# Patient Record
Sex: Female | Born: 1965
Health system: Southern US, Community
[De-identification: ages and names within clinical notes are randomized; demographics above are authoritative.]

## PROBLEM LIST (undated history)

## (undated) DIAGNOSIS — R1013 Epigastric pain: Secondary | ICD-10-CM

## (undated) DIAGNOSIS — E876 Hypokalemia: Secondary | ICD-10-CM

## (undated) DIAGNOSIS — Z803 Family history of malignant neoplasm of breast: Secondary | ICD-10-CM

## (undated) DIAGNOSIS — IMO0002 Reserved for concepts with insufficient information to code with codable children: Secondary | ICD-10-CM

## (undated) DIAGNOSIS — G8929 Other chronic pain: Secondary | ICD-10-CM

## (undated) DIAGNOSIS — G43909 Migraine, unspecified, not intractable, without status migrainosus: Secondary | ICD-10-CM

## (undated) DIAGNOSIS — R7989 Other specified abnormal findings of blood chemistry: Secondary | ICD-10-CM

## (undated) DIAGNOSIS — R1031 Right lower quadrant pain: Secondary | ICD-10-CM

## (undated) DIAGNOSIS — R1011 Right upper quadrant pain: Secondary | ICD-10-CM

## (undated) DIAGNOSIS — K5900 Constipation, unspecified: Secondary | ICD-10-CM

## (undated) DIAGNOSIS — R079 Chest pain, unspecified: Secondary | ICD-10-CM

## (undated) DIAGNOSIS — D649 Anemia, unspecified: Secondary | ICD-10-CM

## (undated) DIAGNOSIS — K3189 Other diseases of stomach and duodenum: Secondary | ICD-10-CM

## (undated) DIAGNOSIS — I10 Essential (primary) hypertension: Secondary | ICD-10-CM

## (undated) DIAGNOSIS — G459 Transient cerebral ischemic attack, unspecified: Secondary | ICD-10-CM

## (undated) DIAGNOSIS — R945 Abnormal results of liver function studies: Secondary | ICD-10-CM

## (undated) HISTORY — DX: Family history of malignant neoplasm of breast: Z80.3

## (undated) HISTORY — DX: Other diseases of stomach and duodenum: K31.89

## (undated) HISTORY — DX: Transient cerebral ischemic attack, unspecified: G45.9

## (undated) HISTORY — DX: Constipation, unspecified: K59.00

## (undated) HISTORY — DX: Epigastric pain: R10.13

## (undated) HISTORY — DX: Hypokalemia: E87.6

## (undated) HISTORY — DX: Right upper quadrant pain: R10.11

## (undated) HISTORY — DX: Anemia, unspecified: D64.9

## (undated) HISTORY — DX: Right lower quadrant pain: R10.31

## (undated) HISTORY — DX: Migraine, unspecified, not intractable, without status migrainosus: G43.909

## (undated) HISTORY — DX: Other chronic pain: G89.29

## (undated) HISTORY — DX: Chest pain, unspecified: R07.9

## (undated) HISTORY — DX: Abnormal results of liver function studies: R94.5

## (undated) HISTORY — DX: Other specified abnormal findings of blood chemistry: R79.89

## (undated) HISTORY — PX: TUBAL LIGATION: SHX77

---

## 1998-08-29 ENCOUNTER — Ambulatory Visit (HOSPITAL_COMMUNITY): Admission: RE | Admit: 1998-08-29 | Discharge: 1998-08-29 | Payer: Self-pay | Admitting: Family Medicine

## 1998-08-29 ENCOUNTER — Encounter: Payer: Self-pay | Admitting: Family Medicine

## 1998-10-15 ENCOUNTER — Encounter: Payer: Self-pay | Admitting: Family Medicine

## 1998-10-15 ENCOUNTER — Ambulatory Visit (HOSPITAL_COMMUNITY): Admission: RE | Admit: 1998-10-15 | Discharge: 1998-10-15 | Payer: Self-pay | Admitting: Family Medicine

## 1999-04-17 ENCOUNTER — Encounter: Admission: RE | Admit: 1999-04-17 | Discharge: 1999-04-30 | Payer: Self-pay | Admitting: *Deleted

## 1999-04-24 ENCOUNTER — Encounter: Payer: Self-pay | Admitting: Gastroenterology

## 1999-04-24 ENCOUNTER — Ambulatory Visit (HOSPITAL_COMMUNITY): Admission: RE | Admit: 1999-04-24 | Discharge: 1999-04-24 | Payer: Self-pay | Admitting: Gastroenterology

## 1999-05-25 ENCOUNTER — Emergency Department (HOSPITAL_COMMUNITY): Admission: EM | Admit: 1999-05-25 | Discharge: 1999-05-26 | Payer: Self-pay | Admitting: Emergency Medicine

## 1999-05-27 ENCOUNTER — Encounter: Payer: Self-pay | Admitting: Gastroenterology

## 1999-05-27 ENCOUNTER — Ambulatory Visit (HOSPITAL_COMMUNITY): Admission: RE | Admit: 1999-05-27 | Discharge: 1999-05-27 | Payer: Self-pay | Admitting: Gastroenterology

## 1999-05-31 ENCOUNTER — Emergency Department (HOSPITAL_COMMUNITY): Admission: EM | Admit: 1999-05-31 | Discharge: 1999-05-31 | Payer: Self-pay | Admitting: Emergency Medicine

## 2000-06-10 ENCOUNTER — Encounter: Payer: Self-pay | Admitting: Family Medicine

## 2000-06-10 ENCOUNTER — Ambulatory Visit (HOSPITAL_COMMUNITY): Admission: RE | Admit: 2000-06-10 | Discharge: 2000-06-10 | Payer: Self-pay | Admitting: Family Medicine

## 2001-07-05 ENCOUNTER — Encounter: Payer: Self-pay | Admitting: Family Medicine

## 2001-07-05 ENCOUNTER — Encounter: Admission: RE | Admit: 2001-07-05 | Discharge: 2001-07-05 | Payer: Self-pay | Admitting: Family Medicine

## 2002-06-29 ENCOUNTER — Other Ambulatory Visit: Admission: RE | Admit: 2002-06-29 | Discharge: 2002-06-29 | Payer: Self-pay | Admitting: Gynecology

## 2002-10-30 ENCOUNTER — Encounter: Payer: Self-pay | Admitting: *Deleted

## 2002-10-30 ENCOUNTER — Encounter: Admission: RE | Admit: 2002-10-30 | Discharge: 2002-10-30 | Payer: Self-pay | Admitting: *Deleted

## 2002-11-28 ENCOUNTER — Encounter: Admission: RE | Admit: 2002-11-28 | Discharge: 2002-11-28 | Payer: Self-pay | Admitting: Gastroenterology

## 2002-11-28 ENCOUNTER — Encounter: Payer: Self-pay | Admitting: Gastroenterology

## 2002-12-24 ENCOUNTER — Ambulatory Visit (HOSPITAL_COMMUNITY): Admission: RE | Admit: 2002-12-24 | Discharge: 2002-12-24 | Payer: Self-pay | Admitting: Gastroenterology

## 2003-01-16 ENCOUNTER — Ambulatory Visit (HOSPITAL_COMMUNITY): Admission: RE | Admit: 2003-01-16 | Discharge: 2003-01-16 | Payer: Self-pay | Admitting: Gastroenterology

## 2003-01-16 ENCOUNTER — Encounter: Payer: Self-pay | Admitting: Gastroenterology

## 2003-08-21 ENCOUNTER — Emergency Department (HOSPITAL_COMMUNITY): Admission: AD | Admit: 2003-08-21 | Discharge: 2003-08-21 | Payer: Self-pay | Admitting: Family Medicine

## 2007-08-08 ENCOUNTER — Emergency Department (HOSPITAL_COMMUNITY): Admission: EM | Admit: 2007-08-08 | Discharge: 2007-08-09 | Payer: Self-pay | Admitting: Emergency Medicine

## 2007-08-30 ENCOUNTER — Ambulatory Visit: Payer: Self-pay | Admitting: Gastroenterology

## 2008-10-09 ENCOUNTER — Ambulatory Visit (HOSPITAL_COMMUNITY): Admission: RE | Admit: 2008-10-09 | Discharge: 2008-10-09 | Payer: Self-pay | Admitting: Family Medicine

## 2008-10-30 ENCOUNTER — Ambulatory Visit (HOSPITAL_COMMUNITY): Admission: RE | Admit: 2008-10-30 | Discharge: 2008-10-30 | Payer: Self-pay | Admitting: Family Medicine

## 2009-03-17 ENCOUNTER — Encounter: Admission: RE | Admit: 2009-03-17 | Discharge: 2009-03-17 | Payer: Self-pay | Admitting: Family Medicine

## 2009-12-12 ENCOUNTER — Emergency Department (HOSPITAL_BASED_OUTPATIENT_CLINIC_OR_DEPARTMENT_OTHER): Admission: EM | Admit: 2009-12-12 | Discharge: 2009-12-12 | Payer: Self-pay | Admitting: Emergency Medicine

## 2010-03-23 ENCOUNTER — Encounter: Admission: RE | Admit: 2010-03-23 | Discharge: 2010-03-23 | Payer: Self-pay | Admitting: Family Medicine

## 2010-09-29 NOTE — Assessment & Plan Note (Signed)
[facility]                         GASTROENTEROLOGY OFFICE NOTE   Stacey Mendoza, Stacey Mendoza                     MRN:          [id number]  DATE:08/30/2007                            DOB:          02-Dec-1965    CHIEF COMPLAINT:  Abdominal pain.   HISTORY OF PRESENT ILLNESS:  Stacey Mendoza is a 45 year old African  American female referred through the courtesy of Dr. Cliffton Asters for  evaluation.  For years, she has been complaining of right flank pain  consisting of aching, mild pain over her right ribs. It is not related  to eating or position.  She occasionally has mild postprandial  midepigastric pain which she attributes to certain foods including  greasy foods and spicy foods.  On March 25, she was seen in the ER  because of severe upper abdominal pain associated with nausea and  vomiting.  She claims the pain was much worse than pain she has had in  the past.  She underwent workup including ultrasound, LFTs and lipase,  which were all normal.  CBC was pertinent for a normal white count, but  a microcytic anemia.  She does have heavy menstrual periods.  She is on  no gastric irritants including nonsteroidals.  She has had no  recurrences of pain.   She underwent upper endoscopy several years ago for complaints of upper  abdominal pain and was told this exam was negative.   PAST MEDICAL HISTORY:  Unremarkable.   FAMILY HISTORY:  Noncontributory.   MEDICATIONS:  She is on no regular medicine.   ALLERGIES:  She has no allergies.   She neither smokes nor drinks.  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   REVIEW OF SYSTEMS:  Positive for headaches, otherwise is negative.   PHYSICAL EXAMINATION:  She is a healthy-appearing female, pulse 80,  blood pressure 122/80, weight 163.  HEENT:  EOMI.  PERRLA.  Sclerae are anicteric.  Conjunctivae are pink.  NECK:  Supple without thyromegaly, adenopathy or carotid bruits.  CHEST:  Clear to auscultation and  percussion without adventitious  sounds.  She has some minimal tenderness over the right lateral ribs.  There are no masses.  CARDIAC:  Regular rhythm; normal S1, S2.  There are no murmurs, gallops  or rubs.  ABDOMEN:  Bowel sounds are normoactive.  Abdomen is soft, nontender and  nondistended.  There are no abdominal masses, tenderness, splenic  enlargement or hepatomegaly.  EXTREMITIES:  Full range of motion.  No cyanosis, clubbing or edema.  RECTAL:  Deferred.  MUSCULOSKELETAL:  Pertinent for no deformities.  NEUROLOGIC:  She is nonfocal and oriented x3.   LABORATORY AND X-RAY REPORTS:  Reviewed as started in the HPI.   IMPRESSION:  1. Upper abdominal pain.  The etiology for this is not apparent by her      previous workup.  There is no evidence for gallbladder disease or      pancreatitis.  Pain would be very atypical for active peptic ulcer      disease or gastroesophageal reflux disease.  At this point, I      suspect  that she had an episode of severe spasm.  2. Microcytic anemia.  This is probably due to menorrhagia.  3. Musculoskeletal pain over the right ribs.   RECOMMENDATION:  NuLev 0.25 mg sublingual p.r.n.  I carefully instructed  Stacey Mendoza to return if she is having recurrent abdominal pain.     Barbette Hair. Arlyce Dice, MD,FACG  Electronically Signed    RDK/MedQ  DD: 08/30/2007  DT: 08/30/2007  Job #: [id number]   cc:   Stacey Mendoza. Cliffton Asters, M.D.

## 2010-09-29 NOTE — Letter (Signed)
August 30, 2007    Stacie Acres. Cliffton Asters, M.D.  [address]  [place], [place] [address]   RE:  Stacey, Mendoza  MRN:  [id number]  /  DOB:  06/15/1965   Dear Dr. Cliffton Asters:   Upon your kind referral, I had the pleasure of evaluating your patient  and I am pleased to offer my findings.  I saw Stacey Mendoza in the  office today.  Enclosed is a copy of my progress note that details my  findings and recommendations.   Thank you for the opportunity to participate in your patient's care.    Sincerely,      Barbette Hair. Arlyce Dice, MD,FACG  Electronically Signed    RDK/MedQ  DD: 08/30/2007  DT: 08/30/2007  Job #: [id number]

## 2010-09-29 NOTE — Letter (Signed)
August 30, 2007    [name]   RE:  SAE, HANDRICH  MRN:  [id number]  /  DOB:  03-26-1966   Dear [name],   It is my pleasure to have treated you recently as a new patient in my  office.  I appreciate your confidence and the opportunity to participate  in your care.   Since I do have a busy inpatient endoscopy schedule and office schedule,  my office hours vary weekly.  I am, however, available for emergency  calls every day through my office.  If I cannot promptly meet an urgent  office appointment, another one of our gastroenterologists will be able  to assist you.   My well-trained staff are prepared to help you at all times.  For  emergencies after office hours, a physician from our gastroenterology  section is always available through my 24-hour answering service.   While you are under my care, I encourage discussion of your questions  and concerns, and I will be happy to return your calls as soon as I am  available.   Once again, I welcome you as a new patient and I look forward to a happy  and healthy relationship.    Sincerely,      Barbette Hair. Arlyce Dice, MD,FACG  Electronically Signed   RDK/MedQ  DD: 08/30/2007  DT: 08/30/2007  Job #: [id number]

## 2010-10-02 NOTE — Op Note (Signed)
   Stacey Mendoza, Stacey Mendoza                        ACCOUNT NO.:  [id number]   MEDICAL RECORD NO.:  [id number]                   PATIENT TYPE:  AMB   LOCATION:  ENDO                                 FACILITY:  [facility]   PHYSICIAN:  Anselmo Rod, M.D.               DATE OF BIRTH:  19-Feb-1966   DATE OF PROCEDURE:  12/24/2002  DATE OF DISCHARGE:                                 OPERATIVE REPORT   PROCEDURE PERFORMED:  Esophagogastroduodenoscopy.   ENDOSCOPIST:  Charna Elizabeth, M.D.   INSTRUMENT USED:  Olympus video panendoscope.   INDICATIONS FOR PROCEDURE:  The patient is a 45 year old African-American  female with a history of epigastric pain and nausea.  Rule out peptic ulcer  disease, esophagitis, gastritis, etc.   PREPROCEDURE PREPARATION:  Informed consent was procured from the patient.  The patient was fasted for eight hours prior to the procedure.   PREPROCEDURE PHYSICAL:  The patient had stable vital signs.  Neck supple,  chest clear to auscultation.  S1, S2 regular.  Abdomen soft with normal  bowel sounds.   DESCRIPTION OF PROCEDURE:  The patient was placed in the left lateral  decubitus position and sedated with 60 mg of Demerol and 6 mg of Versed  intravenously.  Once the patient was adequately sedated and maintained on  low-flow oxygen and continuous cardiac monitoring, the Olympus video  panendoscope was advanced through the mouth piece over the tongue into the  esophagus under direct vision.  The entire esophagus appeared normal with no  evidence of ring, stricture, masses, esophagitis or Barrett's mucosa.  The  scope was then advanced to the stomach.  The entire gastric mucosa of the  proximal small bowel appeared normal.  Retroflexion in the high cardia  revealed no abnormality.  The patient tolerated the procedure well without  complication.   IMPRESSION:  Normal esophagogastroduodenoscopy.    RECOMMENDATIONS:  1. Avoid all nonsteroidals including aspirin.  2.  Continue proton pump inhibitor.  3. Outpatient follow-up in the next two weeks for further recommendations.                                                Anselmo Rod, M.D.    JNM/MEDQ  D:  12/24/2002  T:  12/25/2002  Job:  [id number]   cc:   Melida Quitter, M.D.  [address]  [place]  [place] [address]  Fax: [phone]

## 2010-10-03 ENCOUNTER — Inpatient Hospital Stay (INDEPENDENT_AMBULATORY_CARE_PROVIDER_SITE_OTHER)
Admission: RE | Admit: 2010-10-03 | Discharge: 2010-10-03 | Disposition: A | Discharge status: Home / Self Care | Payer: 59 | Source: Ambulatory Visit | Attending: Family Medicine | Admitting: Family Medicine

## 2010-10-03 DIAGNOSIS — J019 Acute sinusitis, unspecified: Secondary | ICD-10-CM

## 2010-11-01 ENCOUNTER — Emergency Department (HOSPITAL_BASED_OUTPATIENT_CLINIC_OR_DEPARTMENT_OTHER)
Admission: EM | Admit: 2010-11-01 | Discharge: 2010-11-01 | Disposition: A | Discharge status: Home / Self Care | Payer: 59 | Attending: Emergency Medicine | Admitting: Emergency Medicine

## 2010-11-01 DIAGNOSIS — N390 Urinary tract infection, site not specified: Secondary | ICD-10-CM | POA: Insufficient documentation

## 2010-11-01 LAB — URINE MICROSCOPIC-ADD ON

## 2010-11-01 LAB — URINALYSIS, ROUTINE W REFLEX MICROSCOPIC
Bilirubin Urine: NEGATIVE
Glucose, UA: NEGATIVE mg/dL
Ketones, ur: NEGATIVE mg/dL
pH: 8 (ref 5.0–8.0)

## 2010-11-05 ENCOUNTER — Other Ambulatory Visit (HOSPITAL_COMMUNITY)
Admission: RE | Admit: 2010-11-05 | Discharge: 2010-11-05 | Disposition: A | Discharge status: Home / Self Care | Payer: 59 | Source: Ambulatory Visit | Attending: Gynecology | Admitting: Gynecology

## 2010-11-05 ENCOUNTER — Other Ambulatory Visit: Payer: Self-pay | Admitting: Gynecology

## 2010-11-05 ENCOUNTER — Ambulatory Visit (INDEPENDENT_AMBULATORY_CARE_PROVIDER_SITE_OTHER): Payer: 59 | Admitting: Gynecology

## 2010-11-05 DIAGNOSIS — N898 Other specified noninflammatory disorders of vagina: Secondary | ICD-10-CM

## 2010-11-05 DIAGNOSIS — Z113 Encounter for screening for infections with a predominantly sexual mode of transmission: Secondary | ICD-10-CM

## 2010-11-05 DIAGNOSIS — R350 Frequency of micturition: Secondary | ICD-10-CM

## 2010-11-05 DIAGNOSIS — R82998 Other abnormal findings in urine: Secondary | ICD-10-CM

## 2010-11-05 DIAGNOSIS — Z01419 Encounter for gynecological examination (general) (routine) without abnormal findings: Secondary | ICD-10-CM

## 2010-11-05 DIAGNOSIS — N390 Urinary tract infection, site not specified: Secondary | ICD-10-CM

## 2010-11-05 DIAGNOSIS — Z13220 Encounter for screening for lipoid disorders: Secondary | ICD-10-CM

## 2010-11-05 DIAGNOSIS — B373 Candidiasis of vulva and vagina: Secondary | ICD-10-CM

## 2010-11-05 DIAGNOSIS — Z124 Encounter for screening for malignant neoplasm of cervix: Secondary | ICD-10-CM | POA: Insufficient documentation

## 2010-11-05 DIAGNOSIS — B3731 Acute candidiasis of vulva and vagina: Secondary | ICD-10-CM

## 2011-02-08 LAB — COMPREHENSIVE METABOLIC PANEL
Albumin: 3.6
Alkaline Phosphatase: 55
BUN: 14
CO2: 23
Chloride: 104
GFR calc non Af Amer: >60
Glucose, Bld: 102 — ABNORMAL HIGH
Potassium: 3.7
Total Bilirubin: 1.2

## 2011-02-08 LAB — CBC
HCT: 37.6
Hemoglobin: 12.5
RBC: 5.41 — ABNORMAL HIGH
WBC: 4.2

## 2011-02-08 LAB — DIFFERENTIAL
Basophils Absolute: 0
Basophils Relative: 0
Monocytes Absolute: 0.2
Neutro Abs: 3.6
Neutrophils Relative %: 85 — ABNORMAL HIGH

## 2011-04-05 ENCOUNTER — Other Ambulatory Visit: Payer: Self-pay | Admitting: Gynecology

## 2011-04-05 DIAGNOSIS — Z1231 Encounter for screening mammogram for malignant neoplasm of breast: Secondary | ICD-10-CM

## 2011-04-26 ENCOUNTER — Ambulatory Visit
Admission: RE | Admit: 2011-04-26 | Discharge: 2011-04-26 | Disposition: A | Discharge status: Home / Self Care | Payer: 59 | Source: Ambulatory Visit | Attending: Gynecology | Admitting: Gynecology

## 2011-04-26 ENCOUNTER — Other Ambulatory Visit: Payer: 59 | Admitting: Gynecology

## 2011-04-26 DIAGNOSIS — Z1231 Encounter for screening mammogram for malignant neoplasm of breast: Secondary | ICD-10-CM

## 2011-05-07 ENCOUNTER — Ambulatory Visit (INDEPENDENT_AMBULATORY_CARE_PROVIDER_SITE_OTHER): Payer: 59 | Admitting: Gynecology

## 2011-05-07 DIAGNOSIS — D649 Anemia, unspecified: Secondary | ICD-10-CM

## 2011-05-19 ENCOUNTER — Other Ambulatory Visit: Payer: Self-pay | Admitting: *Deleted

## 2011-05-19 DIAGNOSIS — D649 Anemia, unspecified: Secondary | ICD-10-CM

## 2011-05-21 ENCOUNTER — Other Ambulatory Visit: Payer: 59

## 2011-05-21 DIAGNOSIS — D649 Anemia, unspecified: Secondary | ICD-10-CM

## 2011-05-21 LAB — IRON AND TIBC: %SAT: 42 % (ref 20–55)

## 2011-05-21 LAB — FOLATE: Folate: >20 ng/mL

## 2011-05-24 ENCOUNTER — Other Ambulatory Visit (INDEPENDENT_AMBULATORY_CARE_PROVIDER_SITE_OTHER): Payer: 59 | Admitting: Gynecology

## 2011-05-24 ENCOUNTER — Other Ambulatory Visit: Payer: Self-pay | Admitting: Anesthesiology

## 2011-05-24 DIAGNOSIS — Z1211 Encounter for screening for malignant neoplasm of colon: Secondary | ICD-10-CM

## 2011-05-29 ENCOUNTER — Other Ambulatory Visit: Payer: Self-pay

## 2011-05-29 ENCOUNTER — Emergency Department (HOSPITAL_BASED_OUTPATIENT_CLINIC_OR_DEPARTMENT_OTHER)
Admission: EM | Admit: 2011-05-29 | Discharge: 2011-05-29 | Disposition: A | Discharge status: Home / Self Care | Payer: 59 | Attending: Emergency Medicine | Admitting: Emergency Medicine

## 2011-05-29 ENCOUNTER — Encounter (HOSPITAL_BASED_OUTPATIENT_CLINIC_OR_DEPARTMENT_OTHER): Payer: Self-pay | Admitting: *Deleted

## 2011-05-29 DIAGNOSIS — M25519 Pain in unspecified shoulder: Secondary | ICD-10-CM | POA: Insufficient documentation

## 2011-05-29 DIAGNOSIS — M62838 Other muscle spasm: Secondary | ICD-10-CM | POA: Insufficient documentation

## 2011-05-29 MED ORDER — HYDROCODONE-ACETAMINOPHEN 5-500 MG PO TABS: 1.0000 | ORAL_TABLET | Freq: Four times a day (QID) | ORAL | Status: AC | PRN

## 2011-05-29 MED ORDER — CYCLOBENZAPRINE HCL 5 MG PO TABS: 5.0000 mg | ORAL_TABLET | Freq: Two times a day (BID) | ORAL | Status: AC | PRN

## 2011-05-29 NOTE — ED Provider Notes (Signed)
Medical screening examination/treatment/procedure(s) were performed by non-physician practitioner and as supervising physician I was immediately available for consultation/collaboration.  Chucky Homes, MD 05/29/11 2350 

## 2011-05-29 NOTE — ED Notes (Signed)
Pt describes sudden onset of right side neck, shoulder and arm pain. Hurts to move. Denies other s/s.

## 2011-05-29 NOTE — ED Provider Notes (Signed)
History     CSN: [id number]  Arrival date & time 05/29/11  2108   First MD Initiated Contact with Patient 05/29/11 2230      Chief Complaint  Patient presents with  . Shoulder Pain    (Consider location/radiation/quality/duration/timing/severity/associated sxs/prior treatment) HPI Comments: Pt states that she had onset of shoulder pain that radiates into neck and upper back:pt states that it hurts to turn her neck and raise her arm:pt denies injury fall  Patient is a 46 y.o. female presenting with shoulder pain. The history is provided by the patient. No language interpreter was used.  Shoulder Pain This is a new problem. The current episode started today. The problem occurs constantly. The problem has been unchanged. Pertinent negatives include no chest pain or weakness. The symptoms are aggravated by twisting. She has tried nothing for the symptoms.    History reviewed. No pertinent past medical history.  History reviewed. No pertinent past surgical history.  History reviewed. No pertinent family history.  History  Substance Use Topics  . Smoking status: Never Smoker   . Smokeless tobacco: Not on file  . Alcohol Use: No    OB History    Grav Para Term Preterm Abortions TAB SAB Ect Mult Living                  Review of Systems  Cardiovascular: Negative for chest pain.  Neurological: Negative for weakness.  All other systems reviewed and are negative.    Allergies  Review of patient's allergies indicates no known allergies.  Home Medications   Current Outpatient Rx  Name Route Sig Dispense Refill  . FERROUS SULFATE 325 (65 FE) MG PO TABS Oral Take 325 mg by mouth daily.    . IBUPROFEN 800 MG PO TABS Oral Take 800 mg by mouth every 8 (eight) hours as needed. For pain    . ADULT MULTIVITAMIN W/MINERALS CH Oral Take 1 tablet by mouth daily.    . CYCLOBENZAPRINE HCL 5 MG PO TABS Oral Take 1 tablet (5 mg total) by mouth 2 (two) times daily as needed for muscle  spasms. 20 tablet 0  . HYDROCODONE-ACETAMINOPHEN 5-500 MG PO TABS Oral Take 1-2 tablets by mouth every 6 (six) hours as needed for pain. 15 tablet 0    BP 151/93  Pulse 111  Temp(Src) 99.4 F (37.4 C) (Oral)  Resp 20  Ht 5\' 3"  (1.6 m)  Wt 161 lb (73.029 kg)  BMI 28.52 kg/m2  SpO2 100%  LMP 05/02/2011  Physical Exam  Nursing note and vitals reviewed. Constitutional: She is oriented to person, place, and time. She appears well-developed and well-nourished.  HENT:  Head: Normocephalic and atraumatic.  Eyes: Conjunctivae and EOM are normal.  Neck: Neck supple.  Cardiovascular: Normal rate and regular rhythm.   Pulmonary/Chest: Effort normal.  Musculoskeletal: Normal range of motion.       Cervical back: She exhibits tenderness and spasm.  Neurological: She is alert and oriented to person, place, and time. She exhibits normal muscle tone. Coordination normal.  Skin: Skin is warm and dry.  Psychiatric: She has a normal mood and affect.    ED Course  Procedures (including critical care time)  Labs Reviewed - No data to display No results found.   1. Muscle spasm       MDM  Likely muscle spasm based on history and exam        Teressa Lower, NP 05/29/11 2321

## 2011-09-12 ENCOUNTER — Emergency Department (HOSPITAL_COMMUNITY)
Admission: EM | Admit: 2011-09-12 | Discharge: 2011-09-12 | Disposition: A | Discharge status: Home / Self Care | Payer: 59 | Source: Home / Self Care | Attending: Family Medicine | Admitting: Family Medicine

## 2011-09-12 ENCOUNTER — Encounter (HOSPITAL_COMMUNITY): Payer: Self-pay | Admitting: *Deleted

## 2011-09-12 DIAGNOSIS — IMO0002 Reserved for concepts with insufficient information to code with codable children: Secondary | ICD-10-CM

## 2011-09-12 MED ORDER — HYDROCODONE-ACETAMINOPHEN 5-325 MG PO TABS: 1.0000 | ORAL_TABLET | Freq: Four times a day (QID) | ORAL | Status: DC | PRN

## 2011-09-12 NOTE — Discharge Instructions (Signed)
You may take naproxen (Aleve), one to two tablets, every 12 hours for pain relief. You may take the hydrocodone for pain not controlled by the naproxen. Rest the shoulder for the next 48 to 72 hours, then return to stretching and strengthening exercises. Return to care should your symptoms not improve, or worsen in any way.

## 2011-09-12 NOTE — ED Provider Notes (Signed)
History     CSN: [id number]  Arrival date & time 09/12/11  1646   First MD Initiated Contact with Patient 09/12/11 1656      Chief Complaint  Patient presents with  . Neck Injury  . Shoulder Pain    (Consider location/radiation/quality/duration/timing/severity/associated sxs/prior treatment) HPI Comments: Tarrie presents for evaluation of left shoulder pain over the last few days. 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 She reports starting a new workout routine recently using small hand weights and upper extremity exercises. She also reports an intermittent weakness in her left hand. She denies any numbness or tingling. She states that she had some leftover Flexeril, ibuprofen, and hydrocodone which she took with minimal relief.  Patient is a 46 y.o. female presenting with shoulder pain. The history is provided by the patient.  Shoulder Pain This is a new problem. The current episode started more than 2 days ago. The problem has not changed since onset.The symptoms are aggravated by exertion. The symptoms are relieved by nothing.    History reviewed. No pertinent past medical history.  History reviewed. No pertinent past surgical history.  History reviewed. No pertinent family history.  History  Substance Use Topics  . Smoking status: Never Smoker   . Smokeless tobacco: Not on file  . Alcohol Use: No    OB History    Grav Para Term Preterm Abortions TAB SAB Ect Mult Living                  Review of Systems  Constitutional: Negative.   HENT: Negative.   Eyes: Negative.   Respiratory: Negative.   Cardiovascular: Negative.   Gastrointestinal: Negative.   Genitourinary: Negative.   Musculoskeletal:       LEFT shoulder pain  Skin: Negative.   Neurological: Negative.     Allergies  Review of patient's allergies indicates no known allergies.  Home Medications   Current Outpatient Rx  Name Route Sig Dispense  Refill  . CYCLOBENZAPRINE HCL 10 MG PO TABS Oral Take 10 mg by mouth 3 (three) times daily as needed.    Marland Kitchen FERROUS SULFATE 325 (65 FE) MG PO TABS Oral Take 325 mg by mouth daily.    . IBUPROFEN 800 MG PO TABS Oral Take 800 mg by mouth every 8 (eight) hours as needed. For pain    . ADULT MULTIVITAMIN W/MINERALS CH Oral Take 1 tablet by mouth daily.    Marland Kitchen HYDROCODONE-ACETAMINOPHEN 5-325 MG PO TABS Oral Take 1 tablet by mouth every 6 (six) hours as needed. 10 tablet 0    BP 154/92  Pulse 100  Temp(Src) 98.6 F (37 C) (Oral)  Resp 20  SpO2 100%  LMP 08/19/2011  Physical Exam  Nursing note and vitals reviewed. Constitutional: She is oriented to person, place, and time. She appears well-developed and well-nourished.  HENT:  Head: Normocephalic and atraumatic.  Eyes: EOM are normal.  Neck: Normal range of motion.  Pulmonary/Chest: Effort normal.  Musculoskeletal: Normal range of motion.       Left shoulder: She exhibits tenderness and pain. She exhibits normal range of motion and no bony tenderness.       LEFT shoulder: full abduction, adduction, flexion, and extension; 5/5 strength with internal and external rotation; 5/5 grip strength; negative Hawkin's, negative Ober's, negative Neer's, negative Speed's test; pain elicited with resisted abduction, and flexion; tenderness to palpation over deltoid and trapezius   Neurological: She is alert and oriented  to person, place, and time.  Skin: Skin is warm and dry.  Psychiatric: Her behavior is normal.    ED Course  Procedures (including critical care time)  Labs Reviewed - No data to display No results found.   1. Shoulder sprain or strain       MDM  Given rx for hydrocodone; advised OTC naproxen       Renaee Munda, MD 09/12/11 1755

## 2011-09-12 NOTE — ED Notes (Addendum)
Pt with onset of left sided neck and shoulder pain - started upon awakening Thursday morning neck pain - now radiating down left shoulder and left arm - increased pain with movement - taking flexeril and ibuprofen this am took vicodin last night   Per pt had muscle spasms right  neck/shoulder in January had one vicodin and flexeril remaining  from that injury

## 2011-09-13 ENCOUNTER — Emergency Department (HOSPITAL_BASED_OUTPATIENT_CLINIC_OR_DEPARTMENT_OTHER)
Admission: EM | Admit: 2011-09-13 | Discharge: 2011-09-13 | Disposition: A | Discharge status: Home / Self Care | Payer: 59 | Attending: Emergency Medicine | Admitting: Emergency Medicine

## 2011-09-13 ENCOUNTER — Encounter (HOSPITAL_BASED_OUTPATIENT_CLINIC_OR_DEPARTMENT_OTHER): Payer: Self-pay | Admitting: *Deleted

## 2011-09-13 DIAGNOSIS — M25519 Pain in unspecified shoulder: Secondary | ICD-10-CM | POA: Insufficient documentation

## 2011-09-13 DIAGNOSIS — IMO0001 Reserved for inherently not codable concepts without codable children: Secondary | ICD-10-CM | POA: Insufficient documentation

## 2011-09-13 DIAGNOSIS — M791 Myalgia, unspecified site: Secondary | ICD-10-CM

## 2011-09-13 DIAGNOSIS — X500XXA Overexertion from strenuous movement or load, initial encounter: Secondary | ICD-10-CM | POA: Insufficient documentation

## 2011-09-13 DIAGNOSIS — IMO0002 Reserved for concepts with insufficient information to code with codable children: Secondary | ICD-10-CM | POA: Insufficient documentation

## 2011-09-13 DIAGNOSIS — S43409A Unspecified sprain of unspecified shoulder joint, initial encounter: Secondary | ICD-10-CM

## 2011-09-13 DIAGNOSIS — R112 Nausea with vomiting, unspecified: Secondary | ICD-10-CM | POA: Insufficient documentation

## 2011-09-13 MED ORDER — HYDROCODONE-ACETAMINOPHEN 5-500 MG PO TABS: 1.0000 | ORAL_TABLET | Freq: Four times a day (QID) | ORAL | Status: AC | PRN

## 2011-09-13 MED ORDER — METOCLOPRAMIDE HCL 5 MG/ML IJ SOLN
10.0000 mg | Freq: Once | INTRAMUSCULAR | Status: AC
  Administered 2011-09-13: 10 mg via INTRAVENOUS
  Filled 2011-09-13: qty 2

## 2011-09-13 MED ORDER — SODIUM CHLORIDE 0.9 % IV BOLUS (SEPSIS)
1000.0000 mL | Freq: Once | INTRAVENOUS | Status: AC
  Administered 2011-09-13: 1000 mL via INTRAVENOUS

## 2011-09-13 MED ORDER — KETOROLAC TROMETHAMINE 30 MG/ML IJ SOLN
30.0000 mg | Freq: Once | INTRAMUSCULAR | Status: AC
  Administered 2011-09-13: 30 mg via INTRAVENOUS
  Filled 2011-09-13: qty 1

## 2011-09-13 MED ORDER — PROMETHAZINE HCL 25 MG/ML IJ SOLN
25.0000 mg | Freq: Once | INTRAMUSCULAR | Status: AC
  Administered 2011-09-13: 25 mg via INTRAMUSCULAR

## 2011-09-13 MED ORDER — PROMETHAZINE HCL 25 MG/ML IJ SOLN
INTRAMUSCULAR | Status: AC
  Administered 2011-09-13: 25 mg via INTRAMUSCULAR
  Filled 2011-09-13: qty 1

## 2011-09-13 MED ORDER — ONDANSETRON 4 MG PO TBDP
4.0000 mg | ORAL_TABLET | Freq: Once | ORAL | Status: AC
  Administered 2011-09-13: 4 mg via ORAL
  Filled 2011-09-13: qty 1

## 2011-09-13 MED ORDER — HYDROMORPHONE HCL PF 1 MG/ML IJ SOLN: 1.0000 mg | Freq: Once | INTRAMUSCULAR | Status: DC

## 2011-09-13 MED ORDER — HYDROMORPHONE HCL PF 2 MG/ML IJ SOLN
2.0000 mg | Freq: Once | INTRAMUSCULAR | Status: AC
  Administered 2011-09-13: 2 mg via INTRAMUSCULAR
  Filled 2011-09-13: qty 1

## 2011-09-13 NOTE — ED Provider Notes (Addendum)
4pm  Patient states that she feels better now and thinks that it is ok to leave now.  Nausea has subsided after 2 doses of zofran.   5pm  Patient vomited again at the bedside when she tried to get up.  Will give phenergan and watch a while. 6pm Patient continues to vomit.  Will start IV and give reglan.  States that pain medication usually makes her nauseated.   6:30pm  Report given to Dr. Hyman Hopes who will disposition patient when better Remi Haggard, NP 09/13/11 1841  Remi Haggard, NP 09/14/11 [phone]

## 2011-09-13 NOTE — Discharge Instructions (Signed)
Ms Klammer we gave you dilaudid in the ER today for your pain.  This medication seems to make you nauseated.  We also gave you zofran and phenergan for the nausea.  Follow up with the physician listed below.  Take the vicodin for pain but do not drive with this medication.  Return to ER for severe pain or other concerns. rx for zofran . This is for nausea.   Cryotherapy Cryotherapy means treatment with cold. Ice or gel packs can be used to reduce both pain and swelling. Ice is the most helpful within the first 24 to 48 hours after an injury or flareup from overusing a muscle or joint. Sprains, strains, spasms, burning pain, shooting pain, and aches can all be eased with ice. Ice can also be used when recovering from surgery. Ice is effective, has very few side effects, and is safe for most people to use. PRECAUTIONS  Ice is not a safe treatment option for people with:  Raynaud's phenomenon. This is a condition affecting small blood vessels in the extremities. Exposure to cold may cause your problems to return.   Cold hypersensitivity. There are many forms of cold hypersensitivity, including:   Cold urticaria. Red, itchy hives appear on the skin when the tissues begin to warm after being iced.   Cold erythema. This is a red, itchy rash caused by exposure to cold.   Cold hemoglobinuria. Red blood cells break down when the tissues begin to warm after being iced. The hemoglobin that carry oxygen are passed into the urine because they cannot combine with blood proteins fast enough.   Numbness or altered sensitivity in the area being iced.  If you have any of the following conditions, do not use ice until you have discussed cryotherapy with your caregiver:  Heart conditions, such as arrhythmia, angina, or chronic heart disease.   High blood pressure.   Healing wounds or open skin in the area being iced.   Current infections.   Rheumatoid arthritis.   Poor circulation.   Diabetes.  Ice  slows the blood flow in the region it is applied. This is beneficial when trying to stop inflamed tissues from spreading irritating chemicals to surrounding tissues. However, if you expose your skin to cold temperatures for too long or without the proper protection, you can damage your skin or nerves. Watch for signs of skin damage due to cold. HOME CARE INSTRUCTIONS Follow these tips to use ice and cold packs safely.  Place a dry or damp towel between the ice and skin. A damp towel will cool the skin more quickly, so you may need to shorten the time that the ice is used.   For a more rapid response, add gentle compression to the ice.   Ice for no more than 10 to 20 minutes at a time. The bonier the area you are icing, the less time it will take to get the benefits of ice.   Check your skin after 5 minutes to make sure there are no signs of a poor response to cold or skin damage.   Rest 20 minutes or more in between uses.   Once your skin is numb, you can end your treatment. You can test numbness by very lightly touching your skin. The touch should be so light that you do not see the skin dimple from the pressure of your fingertip. When using ice, most people will feel these normal sensations in this order: cold, burning, aching, and numbness.  Do not use ice on someone who cannot communicate their responses to pain, such as small children or people with dementia.  HOW TO MAKE AN ICE PACK Ice packs are the most common way to use ice therapy. Other methods include ice massage, ice baths, and cryo-sprays. Muscle creams that cause a cold, tingly feeling do not offer the same benefits that ice offers and should not be used as a substitute unless recommended by your caregiver. To make an ice pack, do one of the following:  Place crushed ice or a bag of frozen vegetables in a sealable plastic bag. Squeeze out the excess air. Place this bag inside another plastic bag. Slide the bag into a pillowcase  or place a damp towel between your skin and the bag.   Mix 3 parts water with 1 part rubbing alcohol. Freeze the mixture in a sealable plastic bag. When you remove the mixture from the freezer, it will be slushy. Squeeze out the excess air. Place this bag inside another plastic bag. Slide the bag into a pillowcase or place a damp towel between your skin and the bag.  SEEK MEDICAL CARE IF:  You develop white spots on your skin. This may give the skin a blotchy (mottled) appearance.   Your skin turns blue or pale.   Your skin becomes waxy or hard.   Your swelling gets worse.  MAKE SURE YOU:   Understand these instructions.   Will watch your condition.   Will get help right away if you are not doing well or get worse.  Document Released: 12/28/2010 Document Revised: 04/22/2011 Document Reviewed: 12/28/2010 [facility] Patient Information 2012 [place], [place].Cryotherapy Cryotherapy means treatment with cold. Ice or gel packs can be used to reduce both pain and swelling. Ice is the most helpful within the first 24 to 48 hours after an injury or flareup from overusing a muscle or joint. Sprains, strains, spasms, burning pain, shooting pain, and aches can all be eased with ice. Ice can also be used when recovering from surgery. Ice is effective, has very few side effects, and is safe for most people to use. PRECAUTIONS  Ice is not a safe treatment option for people with:  Raynaud's phenomenon. This is a condition affecting small blood vessels in the extremities. Exposure to cold may cause your problems to return.   Cold hypersensitivity. There are many forms of cold hypersensitivity, including:   Cold urticaria. Red, itchy hives appear on the skin when the tissues begin to warm after being iced.   Cold erythema. This is a red, itchy rash caused by exposure to cold.   Cold hemoglobinuria. Red blood cells break down when the tissues begin to warm after being iced. The hemoglobin that carry  oxygen are passed into the urine because they cannot combine with blood proteins fast enough.   Numbness or altered sensitivity in the area being iced.  If you have any of the following conditions, do not use ice until you have discussed cryotherapy with your caregiver:  Heart conditions, such as arrhythmia, angina, or chronic heart disease.   High blood pressure.   Healing wounds or open skin in the area being iced.   Current infections.   Rheumatoid arthritis.   Poor circulation.   Diabetes.  Ice slows the blood flow in the region it is applied. This is beneficial when trying to stop inflamed tissues from spreading irritating chemicals to surrounding tissues. However, if you expose your skin to cold temperatures for too long  or without the proper protection, you can damage your skin or nerves. Watch for signs of skin damage due to cold. HOME CARE INSTRUCTIONS Follow these tips to use ice and cold packs safely.  Place a dry or damp towel between the ice and skin. A damp towel will cool the skin more quickly, so you may need to shorten the time that the ice is used.   For a more rapid response, add gentle compression to the ice.   Ice for no more than 10 to 20 minutes at a time. The bonier the area you are icing, the less time it will take to get the benefits of ice.   Check your skin after 5 minutes to make sure there are no signs of a poor response to cold or skin damage.   Rest 20 minutes or more in between uses.   Once your skin is numb, you can end your treatment. You can test numbness by very lightly touching your skin. The touch should be so light that you do not see the skin dimple from the pressure of your fingertip. When using ice, most people will feel these normal sensations in this order: cold, burning, aching, and numbness.   Do not use ice on someone who cannot communicate their responses to pain, such as small children or people with dementia.  HOW TO MAKE AN ICE  PACK Ice packs are the most common way to use ice therapy. Other methods include ice massage, ice baths, and cryo-sprays. Muscle creams that cause a cold, tingly feeling do not offer the same benefits that ice offers and should not be used as a substitute unless recommended by your caregiver. To make an ice pack, do one of the following:  Place crushed ice or a bag of frozen vegetables in a sealable plastic bag. Squeeze out the excess air. Place this bag inside another plastic bag. Slide the bag into a pillowcase or place a damp towel between your skin and the bag.   Mix 3 parts water with 1 part rubbing alcohol. Freeze the mixture in a sealable plastic bag. When you remove the mixture from the freezer, it will be slushy. Squeeze out the excess air. Place this bag inside another plastic bag. Slide the bag into a pillowcase or place a damp towel between your skin and the bag.  SEEK MEDICAL CARE IF:  You develop white spots on your skin. This may give the skin a blotchy (mottled) appearance.   Your skin turns blue or pale.   Your skin becomes waxy or hard.   Your swelling gets worse.  MAKE SURE YOU:   Understand these instructions.   Will watch your condition.   Will get help right away if you are not doing well or get worse.  Document Released: 12/28/2010 Document Revised: 04/22/2011 Document Reviewed: 12/28/2010 [facility] Patient Information 2012 [place], [place].Cryotherapy Cryotherapy means treatment with cold. Ice or gel packs can be used to reduce both pain and swelling. Ice is the most helpful within the first 24 to 48 hours after an injury or flareup from overusing a muscle or joint. Sprains, strains, spasms, burning pain, shooting pain, and aches can all be eased with ice. Ice can also be used when recovering from surgery. Ice is effective, has very few side effects, and is safe for most people to use. PRECAUTIONS  Ice is not a safe treatment option for people with:  Raynaud's  phenomenon. This is a condition affecting small blood vessels in  the extremities. Exposure to cold may cause your problems to return.   Cold hypersensitivity. There are many forms of cold hypersensitivity, including:   Cold urticaria. Red, itchy hives appear on the skin when the tissues begin to warm after being iced.   Cold erythema. This is a red, itchy rash caused by exposure to cold.   Cold hemoglobinuria. Red blood cells break down when the tissues begin to warm after being iced. The hemoglobin that carry oxygen are passed into the urine because they cannot combine with blood proteins fast enough.   Numbness or altered sensitivity in the area being iced.  If you have any of the following conditions, do not use ice until you have discussed cryotherapy with your caregiver:  Heart conditions, such as arrhythmia, angina, or chronic heart disease.   High blood pressure.   Healing wounds or open skin in the area being iced.   Current infections.   Rheumatoid arthritis.   Poor circulation.   Diabetes.  Ice slows the blood flow in the region it is applied. This is beneficial when trying to stop inflamed tissues from spreading irritating chemicals to surrounding tissues. However, if you expose your skin to cold temperatures for too long or without the proper protection, you can damage your skin or nerves. Watch for signs of skin damage due to cold. HOME CARE INSTRUCTIONS Follow these tips to use ice and cold packs safely.  Place a dry or damp towel between the ice and skin. A damp towel will cool the skin more quickly, so you may need to shorten the time that the ice is used.   For a more rapid response, add gentle compression to the ice.   Ice for no more than 10 to 20 minutes at a time. The bonier the area you are icing, the less time it will take to get the benefits of ice.   Check your skin after 5 minutes to make sure there are no signs of a poor response to cold or skin damage.     Rest 20 minutes or more in between uses.   Once your skin is numb, you can end your treatment. You can test numbness by very lightly touching your skin. The touch should be so light that you do not see the skin dimple from the pressure of your fingertip. When using ice, most people will feel these normal sensations in this order: cold, burning, aching, and numbness.   Do not use ice on someone who cannot communicate their responses to pain, such as small children or people with dementia.  HOW TO MAKE AN ICE PACK Ice packs are the most common way to use ice therapy. Other methods include ice massage, ice baths, and cryo-sprays. Muscle creams that cause a cold, tingly feeling do not offer the same benefits that ice offers and should not be used as a substitute unless recommended by your caregiver. To make an ice pack, do one of the following:  Place crushed ice or a bag of frozen vegetables in a sealable plastic bag. Squeeze out the excess air. Place this bag inside another plastic bag. Slide the bag into a pillowcase or place a damp towel between your skin and the bag.   Mix 3 parts water with 1 part rubbing alcohol. Freeze the mixture in a sealable plastic bag. When you remove the mixture from the freezer, it will be slushy. Squeeze out the excess air. Place this bag inside another plastic bag. Slide the bag  into a pillowcase or place a damp towel between your skin and the bag.  SEEK MEDICAL CARE IF:  You develop white spots on your skin. This may give the skin a blotchy (mottled) appearance.   Your skin turns blue or pale.   Your skin becomes waxy or hard.   Your swelling gets worse.  MAKE SURE YOU:   Understand these instructions.   Will watch your condition.   Will get help right away if you are not doing well or get worse.  Document Released: 12/28/2010 Document Revised: 04/22/2011 Document Reviewed: 12/28/2010 [facility] Patient Information 2012 [place], [place].

## 2011-09-13 NOTE — ED Notes (Signed)
Plunkett MD at bedside. 

## 2011-09-13 NOTE — ED Notes (Signed)
Pt continues to c/o nausea and vomting small amounts of emesis.  Thurston Hole, FNP to ne informed.

## 2011-09-13 NOTE — ED Notes (Signed)
Left arm and shoulder are throbbing. Started 4 days ago. Was seen at [facility] yesterday and diagnosed with muscle strain. She was given Vicodin. She has been taking Vicodin, Flexeril and Ibuprofen without relief. States she does weight training. Pain gets better if she holds her arm up over her head. Worse when she lays down at night or moves her shoulder joint.

## 2011-09-13 NOTE — ED Provider Notes (Addendum)
History     CSN: [id number]  Arrival date & time 09/13/11  1332   First MD Initiated Contact with Patient 09/13/11 1350      No chief complaint on file.   (Consider location/radiation/quality/duration/timing/severity/associated sxs/prior treatment) HPI Comments: Pain is now 10 out of 10, it radiates down her arm and into her neck. She denies any weakness, numbness. Certain positions and movement of the arm make the pain worse.  Patient is a 46 y.o. female presenting with shoulder pain.  Shoulder Pain This is a new (After a weight training class at the gym on Thursday she's had progressively worsening left shoulder pain) problem. Episode onset: 5 days ago. The problem occurs constantly. The problem has been gradually worsening. Pertinent negatives include no chest pain, no abdominal pain and no shortness of breath. The symptoms are aggravated by bending (certain positions). The symptoms are relieved by nothing. Treatments tried: Flexeril, ibuprofen, Vicodin. The treatment provided moderate relief.    History reviewed. No pertinent past medical history.  History reviewed. No pertinent past surgical history.  No family history on file.  History  Substance Use Topics  . Smoking status: Never Smoker   . Smokeless tobacco: Not on file  . Alcohol Use: No    OB History    Grav Para Term Preterm Abortions TAB SAB Ect Mult Living                  Review of Systems  Respiratory: Negative for shortness of breath.   Cardiovascular: Negative for chest pain.  Gastrointestinal: Negative for abdominal pain.  All other systems reviewed and are negative.    Allergies  Review of patient's allergies indicates no known allergies.  Home Medications   Current Outpatient Rx  Name Route Sig Dispense Refill  . CYCLOBENZAPRINE HCL 10 MG PO TABS Oral Take 10 mg by mouth 3 (three) times daily as needed.    Marland Kitchen FERROUS SULFATE 325 (65 FE) MG PO TABS Oral Take 325 mg by mouth daily.    Marland Kitchen  HYDROCODONE-ACETAMINOPHEN 5-325 MG PO TABS Oral Take 1 tablet by mouth every 6 (six) hours as needed. 10 tablet 0  . IBUPROFEN 800 MG PO TABS Oral Take 800 mg by mouth every 8 (eight) hours as needed. For pain    . ADULT MULTIVITAMIN W/MINERALS CH Oral Take 1 tablet by mouth daily.      BP 144/76  Pulse 81  Temp(Src) 98.3 F (36.8 C) (Oral)  Resp 20  SpO2 100%  LMP 08/19/2011  Physical Exam  Nursing note and vitals reviewed. Constitutional: She is oriented to person, place, and time. She appears well-developed and well-nourished. She appears distressed.  HENT:  Head: Normocephalic and atraumatic.  Eyes: EOM are normal. Pupils are equal, round, and reactive to light.  Cardiovascular: Normal rate, regular rhythm, normal heart sounds and intact distal pulses.  Exam reveals no friction rub.   No murmur heard. Pulmonary/Chest: Effort normal and breath sounds normal. She has no wheezes. She has no rales.  Musculoskeletal: She exhibits no tenderness.       Left shoulder: She exhibits decreased range of motion, tenderness and bony tenderness. She exhibits no swelling, no effusion, normal pulse and normal strength.       Arms:      No edema  Neurological: She is alert and oriented to person, place, and time. No cranial nerve deficit.  Skin: Skin is warm and dry. No rash noted.  Psychiatric: She has a normal mood and  affect. Her behavior is normal.    ED Course  Procedures (including critical care time)  Labs Reviewed - No data to display No results found.    Date: 09/13/2011  Rate: 75  Rhythm: normal sinus rhythm  QRS Axis: normal  Intervals: normal  ST/T Wave abnormalities: normal  Conduction Disutrbances: none  Narrative Interpretation: unremarkable      1. Shoulder sprain   2. Muscle pain       MDM   Patient with history of severe left shoulder pain that's worse with certain movements but she sought urgent care for yesterday and was given Vicodin, Flexeril,  ibuprofen but has not had any relief. Tenderness with posterior palpation to the shoulder. No symptoms suggestive of cardiac etiology an EKG was within normal limits. Patient states pain started after she did or a weight training class at the gym. She has normal pulses and capillary refill in that extremity. Normal strength bilaterally. Patient given pain control  3:12 PM On reevaluation pain is improved, but the patient is feeling very nauseated. And Zofran ordered.      Gwyneth Sprout, MD 09/13/11 1512  Gwyneth Sprout, MD 09/13/11 1515  Gwyneth Sprout, MD 09/13/11 1517

## 2011-09-14 NOTE — ED Provider Notes (Signed)
Medical screening examination/treatment/procedure(s) were conducted as a shared visit with non-physician practitioner(s) and myself.  I personally evaluated the patient during the encounter   Gian Ybarra, MD 09/14/11 1518 

## 2011-09-15 NOTE — ED Provider Notes (Signed)
Medical screening examination/treatment/procedure(s) were conducted as a shared visit with non-physician practitioner(s) and myself.  I personally evaluated the patient during the encounter   Gwyneth Sprout, MD 09/15/11 [phone]

## 2011-09-22 ENCOUNTER — Ambulatory Visit: Payer: 59

## 2011-09-22 ENCOUNTER — Ambulatory Visit (INDEPENDENT_AMBULATORY_CARE_PROVIDER_SITE_OTHER): Payer: 59 | Admitting: Family Medicine

## 2011-09-22 VITALS — BP 147/97 | HR 102 | Temp 98.3°F | Resp 16 | Ht 64.75 in | Wt 149.0 lb

## 2011-09-22 DIAGNOSIS — S139XXA Sprain of joints and ligaments of unspecified parts of neck, initial encounter: Secondary | ICD-10-CM

## 2011-09-22 DIAGNOSIS — R209 Unspecified disturbances of skin sensation: Secondary | ICD-10-CM

## 2011-09-22 DIAGNOSIS — M25519 Pain in unspecified shoulder: Secondary | ICD-10-CM

## 2011-09-22 DIAGNOSIS — R202 Paresthesia of skin: Secondary | ICD-10-CM

## 2011-09-22 DIAGNOSIS — M542 Cervicalgia: Secondary | ICD-10-CM

## 2011-09-22 MED ORDER — PREDNISONE 20 MG PO TABS: ORAL_TABLET | ORAL | Status: AC

## 2011-09-22 NOTE — Progress Notes (Signed)
Patient ID: Stacey Mendoza MRN: [id number], DOB: July 07, 1965, 46 y.o. Date of Encounter: 09/22/2011, 9:03 PM  Primary Physician: Tally Due, MD, MD  Chief Complaint: Left shoulder and neck pain  HPI: 46 y.o. year old female with history below presents with continued left shoulder and neck pain. Patient states her left shoulder and neck have been sore for 3 weeks now. She has been seen by an outside urgent care and the ED previously for this and diagnosed with a muscle strain. She was given hydrocodone that did not help her pain. "It only made me sleepy." Prior to the development of her shoulder and neck pain she was moving her daughter out of her apartment, causing her to left heavy items. There has been no trauma, injury, accidents, or falls. She now complains of some mild paresthesias along the left 1st and 2nd digits. She does have full range of motion along with pain. Her neck has been popping more frequently lately. Her pain is localized to the muscles of the shoulder and neck. No rashes.    No past medical history on file.   Home Meds: Prior to Admission medications   Medication Sig Start Date End Date Taking? Authorizing Provider  ferrous sulfate 325 (65 FE) MG tablet Take 325 mg by mouth daily.   Yes Historical Provider, MD  cyclobenzaprine (FLEXERIL) 10 MG tablet Take 10 mg by mouth 3 (three) times daily as needed.   No Historical Provider, MD  HYDROcodone-acetaminophen (NORCO) 5-325 MG per tablet Take 1 tablet by mouth every 6 (six) hours as needed. 09/12/11  No Renaee Munda, MD  HYDROcodone-acetaminophen (VICODIN) 5-500 MG per tablet Take 1-2 tablets by mouth every 6 (six) hours as needed for pain. 09/13/11 09/23/11 No Remi Haggard, NP  ibuprofen (ADVIL,MOTRIN) 800 MG tablet Take 800 mg by mouth every 8 (eight) hours as needed. For pain   No Historical Provider, MD  Multiple Vitamin (MULITIVITAMIN WITH MINERALS) TABS Take 1 tablet by mouth daily.    Historical Provider, MD     Allergies: No Known Allergies  History   Social History  . Marital Status: Single    Spouse Name: N/A    Number of Children: N/A  . Years of Education: N/A   Occupational History  . Not on file.   Social History Main Topics  . Smoking status: Never Smoker   . Smokeless tobacco: Not on file  . Alcohol Use: No  . Drug Use: No  . Sexually Active: No   Other Topics Concern  . Not on file   Social History Narrative  . No narrative on file     Review of Systems: Constitutional: negative for chills, fever, night sweats, weight changes, or fatigue  HEENT: negative for vision changes, hearing loss, congestion, rhinorrhea, ST, epistaxis, or sinus pressure Cardiovascular: negative for chest pain or palpitations Respiratory: negative for hemoptysis, wheezing, shortness of breath, or cough Abdominal: negative for abdominal pain, nausea, vomiting, diarrhea, or constipation Dermatological: negative for rash Neurologic: negative for headache, dizziness, or syncope All other systems reviewed and are otherwise negative with the exception to those above and in the HPI.   Physical Exam: Blood pressure 147/97, pulse 102, temperature 98.3 F (36.8 C), resp. rate 16, height 5' 4.75" (1.645 m), weight 149 lb (67.586 kg), last menstrual period 09/15/2011., Body mass index is 24.99 kg/(m^2). General: Well developed, well nourished, in no acute distress. Head: Normocephalic, atraumatic, eyes without discharge, sclera non-icteric, nares are without discharge. Bilateral auditory  canals clear, TM's are without perforation, pearly grey and translucent with reflective cone of light bilaterally. Oral cavity moist, posterior pharynx without exudate, erythema, peritonsillar abscess, or post nasal drip.  Neck: Supple. No thyromegaly. Full ROM. No lymphadenopathy. Lungs: Clear bilaterally to auscultation without wheezes, rales, or rhonchi. Breathing is unlabored. Heart: RRR with S1 S2. No murmurs,  rubs, or gallops appreciated. Msk:  C spine: no midline TTP. Left sided paraspinal muscle TTP that radiates into the trapezius. Left lateral movement of the neck is limited due to pain, otherwise FROM. 5/5 strength. Left shoulder TTP through out anterior and posterior aspects. FROM. 5/5 strength through out. Normal grip strength. Negative Hawkins', Speed's, Neer's, and lift off. TTP over the left trapezius and deltoid. Strength and tone normal for age. Extremities/Skin: Warm and dry. No clubbing or cyanosis. No edema. No rashes or suspicious lesions. Neuro: Alert and oriented X 3. Moves all extremities spontaneously. Gait is normal. CNII-XII grossly in tact. Psych:  Responds to questions appropriately with a normal affect.   [facility] reading (PRIMARY) by  Dr. Neva Seat. Upper cervical spondylosis Decreased lordosis Disk space narrowing C6-C7 Degenerative changes at [facility] joint     ASSESSMENT AND PLAN:  46 y.o. year old female with neck/shoulder pain and paresthesias of 1st and 2nd digits affected side. -Referral to PT -Referral to neurosurgery -Prednisone 20 mg #18 3x3, 2x3, 1x3 no RF -RTC precautions -Lortab 15 cc given in office. Husband here to drive home  Signed, [name], Cordelia Poche 09/22/2011 9:03 PM

## 2011-09-22 NOTE — Progress Notes (Signed)
  Subjective:    Patient ID: Stacey Mendoza, female    DOB: May 07, 1966, 46 y.o.   MRN: [id number]  HPI    Review of Systems     Objective:   Physical Exam        Assessment & Plan:  Agree with plan.  XR reviewed with [name], PAC.

## 2011-09-23 ENCOUNTER — Telehealth: Payer: Self-pay

## 2011-09-23 NOTE — Telephone Encounter (Signed)
Pt states that she is in so much pain and the pain medication that [name] prescribed her is not working. Please advise. Pharmacy: [facility]

## 2011-09-23 NOTE — Telephone Encounter (Signed)
Gave pt message and instr's from [place], and told her we are working on referrals. Pt agreed.

## 2011-09-23 NOTE — Telephone Encounter (Signed)
Patient needs to give it time to work. This is the best approach because it will help with the pain, but also may help treat the cause rather than just masking it.

## 2011-09-29 ENCOUNTER — Encounter (HOSPITAL_COMMUNITY): Payer: Self-pay | Admitting: Cardiology

## 2011-09-29 ENCOUNTER — Emergency Department (HOSPITAL_COMMUNITY)
Admission: EM | Admit: 2011-09-29 | Discharge: 2011-09-29 | Disposition: A | Discharge status: Home / Self Care | Payer: 59 | Source: Home / Self Care | Attending: Emergency Medicine | Admitting: Emergency Medicine

## 2011-09-29 DIAGNOSIS — N3000 Acute cystitis without hematuria: Secondary | ICD-10-CM

## 2011-09-29 DIAGNOSIS — H109 Unspecified conjunctivitis: Secondary | ICD-10-CM

## 2011-09-29 HISTORY — DX: Reserved for concepts with insufficient information to code with codable children: IMO0002

## 2011-09-29 LAB — POCT URINALYSIS DIP (DEVICE)
Ketones, ur: NEGATIVE mg/dL
Protein, ur: NEGATIVE mg/dL
Specific Gravity, Urine: 1.015 (ref 1.005–1.030)
Urobilinogen, UA: 0.2 mg/dL (ref 0.0–1.0)
pH: 8.5 — ABNORMAL HIGH (ref 5.0–8.0)

## 2011-09-29 MED ORDER — POLYMYXIN B-TRIMETHOPRIM 10000-0.1 UNIT/ML-% OP SOLN: 1.0000 [drp] | OPHTHALMIC | Status: AC

## 2011-09-29 MED ORDER — CEPHALEXIN 500 MG PO CAPS: 500.0000 mg | ORAL_CAPSULE | Freq: Three times a day (TID) | ORAL | Status: AC

## 2011-09-29 NOTE — ED Notes (Addendum)
Pt reports pressure on urination that stared yesterday. Took some AZO with some relief. Denies fever. Pt also reports right eye pain, redness and itching. Photo sensitivity. Denies blurred vision.

## 2011-09-29 NOTE — ED Provider Notes (Signed)
Chief Complaint  Patient presents with  . Urinary Tract Infection  . 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 She denies any purulent or mucoid drainage. Her vision has been normal. She's not wear contact lenses. She denies any nasal congestion, rhinorrhea, headache, sore throat, adenopathy, or cough.  She also has had a two-day history of suprapubic bladder pressure and urinary frequency. She denies any dysuria, urgency, or hematuria. No GYN complaints. No fever, chills, nausea, or vomiting. She has had urinary tract infections in the past.  Review of Systems:  Other than noted above, the patient denies any of the following symptoms: Systemic:  No fever, chills, sweats, fatigue, or weight loss. Eye:  No redness, eye pain, photophobia, discharge, blurred vision, or diplopia. ENT:  No nasal congestion, rhinorrhea, or sore throat. Lymphatic:  No adenopathy. Skin:  No rash or pruritis.  PMFSH:  Past medical history, family history, social history, meds, and allergies were reviewed.  Physical Exam:   Vital signs:  BP 149/97  Pulse 95  Temp(Src) 99 F (37.2 C) (Oral)  Resp 18  SpO2 100%  LMP 09/18/2011 General:  Alert and in no distress. Eye:  Conjunctiva is were mildly injected right more so than left. There was no discharge. Lids and periorbital tissues are normal. Corneas and anterior chambers are normal. PERRLA, full EOMs. ENT:  TMs and canals clear.  Nasal mucosa normal.  No intra-oral lesions, mucous membranes moist, pharynx clear. Neck:  No adenopathy tenderness or mass. Back: No CVA tenderness. Abdomen: soft, nontender, no organomegaly or mass. Skin:  Clear, warm and dry.  Results for orders placed during the hospital encounter of 09/29/11  POCT URINALYSIS DIP (DEVICE)      Component Value Range     Glucose, UA NEGATIVE  NEGATIVE (mg/dL)   Bilirubin Urine NEGATIVE  NEGATIVE    Ketones, ur NEGATIVE  NEGATIVE (mg/dL)   Specific Gravity, Urine 1.015  1.005 - 1.030    Hgb urine dipstick LARGE (*) NEGATIVE    pH 8.5 (*) 5.0 - 8.0    Protein, ur NEGATIVE  NEGATIVE (mg/dL)   Urobilinogen, UA 0.2  0.0 - 1.0 (mg/dL)   Nitrite NEGATIVE  NEGATIVE    Leukocytes, UA SMALL (*) NEGATIVE    Other Labs Obtained at Urgent Care Center:  A urine culture was obtained.  Results are pending at this time and we will call about any positive results.   Assessment:  The primary encounter diagnosis was Conjunctivitis. A diagnosis of Acute cystitis was also pertinent to this visit.  Plan:   1.  The following meds were prescribed:   New Prescriptions   CEPHALEXIN (KEFLEX) 500 MG CAPSULE    Take 1 capsule (500 mg total) by mouth 3 (three) times daily.   TRIMETHOPRIM-POLYMYXIN B (POLYTRIM) OPHTHALMIC SOLUTION    Place 1 drop into both eyes every 4 (four) hours.   2.  The patient was instructed in symptomatic care and handouts were given. 3.  The patient was told to return if becoming worse in any way, if no better in 3 or 4 days, and given some red flag symptoms that would indicate earlier return.     Reuben Likes, MD 09/29/11 [phone]

## 2011-09-29 NOTE — Discharge Instructions (Signed)
Conjunctivitis Conjunctivitis is commonly called "pink eye." Conjunctivitis can be caused by bacterial or viral infection, allergies, or injuries. There is usually redness of the lining of the eye, itching, discomfort, and sometimes discharge. There may be deposits of matter along the eyelids. A viral infection usually causes a watery discharge, while a bacterial infection causes a yellowish, thick discharge. Pink eye is very contagious and spreads by direct contact. You may be given antibiotic eyedrops as part of your treatment. Before using your eye medicine, remove all drainage from the eye by washing gently with warm water and cotton balls. Continue to use the medication until you have awakened 2 mornings in a row without discharge from the eye. Do not rub your eye. This increases the irritation and helps spread infection. Use separate towels from other household members. Wash your hands with soap and water before and after touching your eyes. Use cold compresses to reduce pain and sunglasses to relieve irritation from light. Do not wear contact lenses or wear eye makeup until the infection is gone. SEEK MEDICAL CARE IF:   Your symptoms are not better after 3 days of treatment.   You have increased pain or trouble seeing.   The outer eyelids become very red or swollen.  Document Released: 06/10/2004 Document Revised: 04/22/2011 Document Reviewed: 05/03/2005 ExitCare Patient Information 2012 [employer].Urinary Tract Infection Infections of the urinary tract can start in several places. A bladder infection (cystitis), a kidney infection (pyelonephritis), and a prostate infection (prostatitis) are different types of urinary tract infections (UTIs). They usually get better if treated with medicines (antibiotics) that kill germs. Take all the medicine until it is gone. You or your child may feel better in a few days, but TAKE ALL MEDICINE or the infection may not respond and may become more difficult  to treat. HOME CARE INSTRUCTIONS   Drink enough water and fluids to keep the urine clear or pale yellow. Cranberry juice is especially recommended, in addition to large amounts of water.   Avoid caffeine, tea, and carbonated beverages. They tend to irritate the bladder.   Alcohol may irritate the prostate.   Only take over-the-counter or prescription medicines for pain, discomfort, or fever as directed by your caregiver.  To prevent further infections:  Empty the bladder often. Avoid holding urine for long periods of time.   After a bowel movement, women should cleanse from front to back. Use each tissue only once.   Empty the bladder before and after sexual intercourse.  FINDING OUT THE RESULTS OF YOUR TEST Not all test results are available during your visit. If your or your child's test results are not back during the visit, make an appointment with your caregiver to find out the results. Do not assume everything is normal if you have not heard from your caregiver or the medical facility. It is important for you to follow up on all test results. SEEK MEDICAL CARE IF:   There is back pain.   Your baby is older than 3 months with a rectal temperature of 100.5 F (38.1 C) or higher for more than 1 day.   Your or your child's problems (symptoms) are no better in 3 days. Return sooner if you or your child is getting worse.  SEEK IMMEDIATE MEDICAL CARE IF:   There is severe back pain or lower abdominal pain.   You or your child develops chills.   You have a fever.   Your baby is older than 3 months with a   rectal temperature of 102 F (38.9 C) or higher.   Your baby is 3 months old or younger with a rectal temperature of 100.4 F (38 C) or higher.   There is nausea or vomiting.   There is continued burning or discomfort with urination.  MAKE SURE YOU:   Understand these instructions.   Will watch your condition.   Will get help right away if you are not doing well or get  worse.  Document Released: 02/10/2005 Document Revised: 04/22/2011 Document Reviewed: 09/15/2006 ExitCare Patient Information 2012 [employer]. 

## 2011-10-01 ENCOUNTER — Other Ambulatory Visit (HOSPITAL_COMMUNITY): Payer: Self-pay | Admitting: Neurosurgery

## 2011-10-01 DIAGNOSIS — M542 Cervicalgia: Secondary | ICD-10-CM

## 2011-10-01 DIAGNOSIS — M5412 Radiculopathy, cervical region: Secondary | ICD-10-CM

## 2011-10-01 LAB — URINE CULTURE: Colony Count: >=100000

## 2011-10-01 NOTE — ED Notes (Signed)
Urine culture: > 100,000 E. Coli. Pt. adequately treated with Keflex. Vassie Moselle 10/01/2011

## 2011-10-02 ENCOUNTER — Ambulatory Visit (HOSPITAL_COMMUNITY)
Admission: RE | Admit: 2011-10-02 | Discharge: 2011-10-02 | Disposition: A | Discharge status: Home / Self Care | Payer: 59 | Source: Ambulatory Visit | Attending: Neurosurgery | Admitting: Neurosurgery

## 2011-10-02 DIAGNOSIS — M5020 Other cervical disc displacement, unspecified cervical region: Secondary | ICD-10-CM | POA: Insufficient documentation

## 2011-10-02 DIAGNOSIS — M5030 Other cervical disc degeneration, unspecified cervical region: Secondary | ICD-10-CM | POA: Insufficient documentation

## 2011-10-02 DIAGNOSIS — M25519 Pain in unspecified shoulder: Secondary | ICD-10-CM | POA: Insufficient documentation

## 2011-10-02 DIAGNOSIS — M47812 Spondylosis without myelopathy or radiculopathy, cervical region: Secondary | ICD-10-CM | POA: Insufficient documentation

## 2011-10-02 DIAGNOSIS — M5412 Radiculopathy, cervical region: Secondary | ICD-10-CM

## 2011-10-02 DIAGNOSIS — M542 Cervicalgia: Secondary | ICD-10-CM

## 2011-10-02 DIAGNOSIS — M79609 Pain in unspecified limb: Secondary | ICD-10-CM | POA: Insufficient documentation

## 2011-10-08 ENCOUNTER — Ambulatory Visit: Payer: 59 | Attending: Neurosurgery | Admitting: Physical Therapy

## 2011-10-08 DIAGNOSIS — M25519 Pain in unspecified shoulder: Secondary | ICD-10-CM | POA: Insufficient documentation

## 2011-10-08 DIAGNOSIS — M542 Cervicalgia: Secondary | ICD-10-CM | POA: Insufficient documentation

## 2011-10-08 DIAGNOSIS — IMO0001 Reserved for inherently not codable concepts without codable children: Secondary | ICD-10-CM | POA: Insufficient documentation

## 2011-10-14 ENCOUNTER — Ambulatory Visit: Payer: 59 | Admitting: Physical Therapy

## 2011-10-22 ENCOUNTER — Ambulatory Visit: Payer: 59 | Attending: Physician Assistant | Admitting: Physical Therapy

## 2011-10-22 DIAGNOSIS — M25519 Pain in unspecified shoulder: Secondary | ICD-10-CM | POA: Insufficient documentation

## 2011-10-22 DIAGNOSIS — IMO0001 Reserved for inherently not codable concepts without codable children: Secondary | ICD-10-CM | POA: Insufficient documentation

## 2011-10-22 DIAGNOSIS — M542 Cervicalgia: Secondary | ICD-10-CM | POA: Insufficient documentation

## 2011-10-28 ENCOUNTER — Ambulatory Visit: Payer: 59 | Admitting: Physical Therapy

## 2011-11-08 ENCOUNTER — Ambulatory Visit: Payer: 59 | Admitting: Physical Therapy

## 2011-11-16 ENCOUNTER — Encounter: Payer: Self-pay | Admitting: Physician Assistant

## 2012-03-23 ENCOUNTER — Other Ambulatory Visit: Payer: Self-pay | Admitting: Gynecology

## 2012-03-23 DIAGNOSIS — Z1231 Encounter for screening mammogram for malignant neoplasm of breast: Secondary | ICD-10-CM

## 2012-05-03 ENCOUNTER — Ambulatory Visit
Admission: RE | Admit: 2012-05-03 | Discharge: 2012-05-03 | Disposition: A | Discharge status: Home / Self Care | Payer: 59 | Source: Ambulatory Visit | Attending: Gynecology | Admitting: Gynecology

## 2012-05-03 DIAGNOSIS — Z1231 Encounter for screening mammogram for malignant neoplasm of breast: Secondary | ICD-10-CM

## 2012-10-23 ENCOUNTER — Encounter: Payer: Self-pay | Admitting: Gynecology

## 2012-10-23 ENCOUNTER — Ambulatory Visit (INDEPENDENT_AMBULATORY_CARE_PROVIDER_SITE_OTHER): Payer: 59 | Admitting: Gynecology

## 2012-10-23 VITALS — BP 142/88

## 2012-10-23 DIAGNOSIS — B9689 Other specified bacterial agents as the cause of diseases classified elsewhere: Secondary | ICD-10-CM

## 2012-10-23 DIAGNOSIS — N760 Acute vaginitis: Secondary | ICD-10-CM

## 2012-10-23 DIAGNOSIS — Z113 Encounter for screening for infections with a predominantly sexual mode of transmission: Secondary | ICD-10-CM

## 2012-10-23 DIAGNOSIS — R1013 Epigastric pain: Secondary | ICD-10-CM

## 2012-10-23 DIAGNOSIS — A499 Bacterial infection, unspecified: Secondary | ICD-10-CM

## 2012-10-23 DIAGNOSIS — K5900 Constipation, unspecified: Secondary | ICD-10-CM

## 2012-10-23 DIAGNOSIS — N898 Other specified noninflammatory disorders of vagina: Secondary | ICD-10-CM

## 2012-10-23 HISTORY — DX: Epigastric pain: R10.13

## 2012-10-23 HISTORY — DX: Constipation, unspecified: K59.00

## 2012-10-23 LAB — WET PREP FOR TRICH, YEAST, CLUE
Trich, Wet Prep: NONE SEEN
Yeast Wet Prep HPF POC: NONE SEEN

## 2012-10-23 MED ORDER — METRONIDAZOLE 500 MG PO TABS: 500.0000 mg | ORAL_TABLET | Freq: Two times a day (BID) | ORAL | Status: DC

## 2012-10-23 MED ORDER — LINACLOTIDE 145 MCG PO CAPS: 145.0000 ug | ORAL_CAPSULE | Freq: Every day | ORAL | Status: DC

## 2012-10-23 NOTE — Progress Notes (Signed)
Patient is a 47 year old that presented to the office today with complaint of mid epigastric pains. She also complains of bloating especially after eating. She states that even with small portions of food causes her to feel bloated and distended. She also states that she belches a lot he also suffers from constipation whereby she has a bowel movement every 3-4 days states that she has used different over-the-counter products and suppositories with minimal help. She has used in the past MiraLax as well as magnesium citrate with no improvement. Patient denies any family history of colorectal cancer or GI problems. She is using condoms for contraception and is having normal menstrual cycles she denies any blood in her stools.  Exam: Abdomen soft nontender no rebound guarding positive bowel sounds all 4 quadrants Pelvic exam: Bartholin urethra Skene was within normal limits Vagina: Creating white discharge was noted with very erythematous vaginal mucosa. Cervix: As above The uterus: Anteverted normal size shape and consistency Adnexa: No palpable masses or tenderness Rectal exam not done  GC and chlamydia culture was obtained results pending at time of this dictation. Wet prep demonstrated clue cells, too numerous to count white blood cells and too numerous to count bacteria.  Assessment/plan: Because of patient's symptoms of upper abdominal bloating and midepigastric pain this will order an upper, ultrasound to rule out cholelithiasis. Going to order an H. pylori as well as a CBC. I'm going to refer to the GI a few days after upper abdominal ultrasound for further assessment. He was encouraged to increase her fluid intake as well as high fiber diet. She is going to be started on Linzess 145 mcg capsule to take once daily her chronic idiopathic constipation symptoms. For her bacterial vaginosis she'll be placed on Flagyl 500 mg one by mouth twice a day for 5 days. We'll wait for the results the GC and  Chlamydia culture.

## 2012-10-23 NOTE — Patient Instructions (Addendum)
Constipation, Adult Constipation is when a person has fewer than 3 bowel movements a week; has difficulty having a bowel movement; or has stools that are dry, hard, or larger than normal. As people grow older, constipation is more common. If you try to fix constipation with medicines that make you have a bowel movement (laxatives), the problem may get worse. Long-term laxative use may cause the muscles of the colon to become weak. A low-fiber diet, not taking in enough fluids, and taking certain medicines may make constipation worse. CAUSES   Certain medicines, such as antidepressants, pain medicine, iron supplements, antacids, and water pills.   Certain diseases, such as diabetes, irritable bowel syndrome (IBS), thyroid disease, or depression.   Not drinking enough water.   Not eating enough fiber-rich foods.   Stress or travel.  Lack of physical activity or exercise.  Not going to the restroom when there is the urge to have a bowel movement.  Ignoring the urge to have a bowel movement.  Using laxatives too much. SYMPTOMS   Having fewer than 3 bowel movements a week.   Straining to have a bowel movement.   Having hard, dry, or larger than normal stools.   Feeling full or bloated.   Pain in the lower abdomen.  Not feeling relief after having a bowel movement. DIAGNOSIS  Your caregiver will take a medical history and perform a physical exam. Further testing may be done for severe constipation. Some tests may include:   A barium enema X-ray to examine your rectum, colon, and sometimes, your small intestine.  A sigmoidoscopy to examine your lower colon.  A colonoscopy to examine your entire colon. TREATMENT  Treatment will depend on the severity of your constipation and what is causing it. Some dietary treatments include drinking more fluids and eating more fiber-rich foods. Lifestyle treatments may include regular exercise. If these diet and lifestyle recommendations  do not help, your caregiver may recommend taking over-the-counter laxative medicines to help you have bowel movements. Prescription medicines may be prescribed if over-the-counter medicines do not work.  HOME CARE INSTRUCTIONS   Increase dietary fiber in your diet, such as fruits, vegetables, whole grains, and beans. Limit high-fat and processed sugars in your diet, such as [place] fries, hamburgers, cookies, candies, and soda.   A fiber supplement may be added to your diet if you cannot get enough fiber from foods.   Drink enough fluids to keep your urine clear or pale yellow.   Exercise regularly or as directed by your caregiver.   Go to the restroom when you have the urge to go. Do not hold it.  Only take medicines as directed by your caregiver. Do not take other medicines for constipation without talking to your caregiver first. SEEK IMMEDIATE MEDICAL CARE IF:   You have bright red blood in your stool.   Your constipation lasts for more than 4 days or gets worse.   You have abdominal or rectal pain.   You have thin, pencil-like stools.  You have unexplained weight loss. MAKE SURE YOU:   Understand these instructions.  Will watch your condition.  Will get help right away if you are not doing well or get worse. Document Released: 01/30/2004 Document Revised: 07/26/2011 Document Reviewed: 04/06/2011 [facility] Patient Information 2014 [place], [place]. Bacterial Vaginosis Bacterial vaginosis (BV) is a vaginal infection where the normal balance of bacteria in the vagina is disrupted. The normal balance is then replaced by an overgrowth of certain bacteria. There are several different  kinds of bacteria that can cause BV. BV is the most common vaginal infection in women of childbearing age. CAUSES   The cause of BV is not fully understood. BV develops when there is an increase or imbalance of harmful bacteria.  Some activities or behaviors can upset the normal balance of  bacteria in the vagina and put women at increased risk including:  Having a new sex partner or multiple sex partners.  Douching.  Using an intrauterine device (IUD) for contraception.  It is not clear what role sexual activity plays in the development of BV. However, women that have never had sexual intercourse are rarely infected with BV. Women do not get BV from toilet seats, bedding, swimming pools or from touching objects around them.  SYMPTOMS   Grey vaginal discharge.  A fish-like odor with discharge, especially after sexual intercourse.  Itching or burning of the vagina and vulva.  Burning or pain with urination.  Some women have no signs or symptoms at all. DIAGNOSIS  Your caregiver must examine the vagina for signs of BV. Your caregiver will perform lab tests and look at the sample of vaginal fluid through a microscope. They will look for bacteria and abnormal cells (clue cells), a pH test higher than 4.5, and a positive amine test all associated with BV.  RISKS AND COMPLICATIONS   Pelvic inflammatory disease (PID).  Infections following gynecology surgery.  Developing HIV.  Developing herpes virus. TREATMENT  Sometimes BV will clear up without treatment. However, all women with symptoms of BV should be treated to avoid complications, especially if gynecology surgery is planned. Female partners generally do not need to be treated. However, BV may spread between female sex partners so treatment is helpful in preventing a recurrence of BV.   BV may be treated with antibiotics. The antibiotics come in either pill or vaginal cream forms. Either can be used with nonpregnant or pregnant women, but the recommended dosages differ. These antibiotics are not harmful to the baby.  BV can recur after treatment. If this happens, a second round of antibiotics will often be prescribed.  Treatment is important for pregnant women. If not treated, BV can cause a premature delivery,  especially for a pregnant woman who had a premature birth in the past. All pregnant women who have symptoms of BV should be checked and treated.  For chronic reoccurrence of BV, treatment with a type of prescribed gel vaginally twice a week is helpful. HOME CARE INSTRUCTIONS   Finish all medication as directed by your caregiver.  Do not have sex until treatment is completed.  Tell your sexual partner that you have a vaginal infection. They should see their caregiver and be treated if they have problems, such as a mild rash or itching.  Practice safe sex. Use condoms. Only have 1 sex partner. PREVENTION  Basic prevention steps can help reduce the risk of upsetting the natural balance of bacteria in the vagina and developing BV:  Do not have sexual intercourse (be abstinent).  Do not douche.  Use all of the medicine prescribed for treatment of BV, even if the signs and symptoms go away.  Tell your sex partner if you have BV. That way, they can be treated, if needed, to prevent reoccurrence. SEEK MEDICAL CARE IF:   Your symptoms are not improving after 3 days of treatment.  You have increased discharge, pain, or fever. MAKE SURE YOU:   Understand these instructions.  Will watch your condition.  Will get  help right away if you are not doing well or get worse. FOR MORE INFORMATION  Division of STD Prevention (DSTDP), Centers for Disease Control and Prevention: [employer] American Social Health Association (ASHA): www.ashastd.org  Document Released: 05/03/2005 Document Revised: 07/26/2011 Document Reviewed: 10/24/2008 [facility] Patient Information 2014 [place], [place].

## 2012-10-24 ENCOUNTER — Telehealth: Payer: Self-pay | Admitting: *Deleted

## 2012-10-24 ENCOUNTER — Encounter: Payer: Self-pay | Admitting: Gastroenterology

## 2012-10-24 ENCOUNTER — Other Ambulatory Visit: Payer: Self-pay | Admitting: Gynecology

## 2012-10-24 DIAGNOSIS — K5900 Constipation, unspecified: Secondary | ICD-10-CM

## 2012-10-24 DIAGNOSIS — D649 Anemia, unspecified: Secondary | ICD-10-CM

## 2012-10-24 DIAGNOSIS — R140 Abdominal distension (gaseous): Secondary | ICD-10-CM

## 2012-10-24 LAB — CBC WITH DIFFERENTIAL/PLATELET
Eosinophils Absolute: 0 10*3/uL (ref 0.0–0.7)
Eosinophils Relative: 1 % (ref 0–5)
HCT: 33.1 % — ABNORMAL LOW (ref 36.0–46.0)
Hemoglobin: 11.2 g/dL — ABNORMAL LOW (ref 12.0–15.0)
Lymphs Abs: 2.1 10*3/uL (ref 0.7–4.0)
MCH: 23 pg — ABNORMAL LOW (ref 26.0–34.0)
MCHC: 33.8 g/dL (ref 30.0–36.0)
MCV: 68.1 fL — ABNORMAL LOW (ref 78.0–100.0)
Monocytes Absolute: 0.3 10*3/uL (ref 0.1–1.0)
Monocytes Relative: 7 % (ref 3–12)
Neutrophils Relative %: 49 % (ref 43–77)
RBC: 4.86 MIL/uL (ref 3.87–5.11)

## 2012-10-24 NOTE — Telephone Encounter (Signed)
Message copied by Aura Camps on Tue Oct 24, 2012  9:35 AM ------      Message from: Ok Edwards      Created: Mon Oct 23, 2012  5:16 PM       Please schedule the following:            1.Upper abdominal ultrasound this week due to midgastric pains and constipation, and bloating            2. Appointment with [facility] a few days after ultrasound ------

## 2012-10-24 NOTE — Telephone Encounter (Signed)
U/S appointment scheduled on 10/26/12 @ 8:15 am must be npo 6 hours prior to appointment.  She will see Dr. Arlyce Dice on 11/22/12 @ 8:30 am. order placed pt informed with all the above.

## 2012-10-26 ENCOUNTER — Ambulatory Visit (HOSPITAL_COMMUNITY)
Admission: RE | Admit: 2012-10-26 | Discharge: 2012-10-26 | Disposition: A | Discharge status: Home / Self Care | Payer: 59 | Source: Ambulatory Visit | Attending: Gynecology | Admitting: Gynecology

## 2012-10-26 DIAGNOSIS — R142 Eructation: Secondary | ICD-10-CM | POA: Insufficient documentation

## 2012-10-26 DIAGNOSIS — R140 Abdominal distension (gaseous): Secondary | ICD-10-CM

## 2012-10-26 DIAGNOSIS — R141 Gas pain: Secondary | ICD-10-CM | POA: Insufficient documentation

## 2012-10-26 DIAGNOSIS — K5900 Constipation, unspecified: Secondary | ICD-10-CM | POA: Insufficient documentation

## 2012-10-26 DIAGNOSIS — R109 Unspecified abdominal pain: Secondary | ICD-10-CM | POA: Insufficient documentation

## 2012-11-06 ENCOUNTER — Encounter: Payer: 59 | Admitting: Gynecology

## 2012-11-14 ENCOUNTER — Encounter: Payer: 59 | Admitting: Gynecology

## 2012-11-22 ENCOUNTER — Ambulatory Visit: Payer: 59 | Admitting: Gastroenterology

## 2013-02-26 ENCOUNTER — Encounter: Payer: Self-pay | Admitting: Gastroenterology

## 2013-04-10 ENCOUNTER — Encounter: Payer: Self-pay | Admitting: Gastroenterology

## 2013-04-10 ENCOUNTER — Ambulatory Visit (INDEPENDENT_AMBULATORY_CARE_PROVIDER_SITE_OTHER): Payer: 59 | Admitting: Gastroenterology

## 2013-04-10 VITALS — BP 132/84 | HR 83 | Ht 65.0 in | Wt 169.0 lb

## 2013-04-10 DIAGNOSIS — K3189 Other diseases of stomach and duodenum: Secondary | ICD-10-CM

## 2013-04-10 DIAGNOSIS — K5900 Constipation, unspecified: Secondary | ICD-10-CM

## 2013-04-10 HISTORY — DX: Other diseases of stomach and duodenum: K31.89

## 2013-04-10 MED ORDER — ESOMEPRAZOLE MAGNESIUM 40 MG PO CPDR: 40.0000 mg | DELAYED_RELEASE_CAPSULE | Freq: Every day | ORAL | Status: DC

## 2013-04-10 NOTE — Assessment & Plan Note (Signed)
Constipation is a chronic problem likely do to functional constipation.  Patient will consider enrollment in a CIC trial

## 2013-04-10 NOTE — Progress Notes (Signed)
History of Present Illness: 47 year old Afro-American female referred for evaluation of abdominal bloating and pyrosis.  She complains of excess belching, frequent pyrosis and constant state of fullness.  Symptoms are worse postprandially.  She is on no gastric irritants including nonsteroidals.  She suffers from chronic constipation.  This is improved with Linzess.  Abdominal ultrasound in June, 2014 was normal.    Past Medical History  Diagnosis Date  . Bulging disc     cervical MRI pending  . Normal vaginal delivery 1992, 1986, 1990  . Anemia    Past Surgical History  Procedure Laterality Date  . Tubal ligation     family history includes Breast cancer (age of onset: 56) in her sister; Cancer in her sister; Cancer (age of onset: 72) in her other; Diabetes in her mother; Heart disease in her father and mother; Hypertension in her brother, mother, sister, sister, and sister. Current Outpatient Prescriptions  Medication Sig Dispense Refill  . ferrous sulfate 325 (65 FE) MG tablet Take 325 mg by mouth daily.      . Linaclotide (LINZESS) 145 MCG CAPS Take 1 capsule (145 mcg total) by mouth daily.  30 capsule  5  . Multiple Vitamin (MULITIVITAMIN WITH MINERALS) TABS Take 1 tablet by mouth daily.       No current facility-administered medications for this visit.   Allergies as of 04/10/2013 - Review Complete 04/10/2013  Allergen Reaction Noted  . Vicodin [hydrocodone-acetaminophen]  10/23/2012    reports that she has never smoked. She has never used smokeless tobacco. She reports that she does not drink alcohol or use illicit drugs.     Review of Systems: Pertinent positive and negative review of systems were noted in the above HPI section. All other review of systems were otherwise negative.  Vital signs were reviewed in today's medical record Physical Exam: General: Well developed , well nourished, no acute distress Skin: anicteric Head: Normocephalic and atraumatic Eyes:   sclerae anicteric, EOMI Ears: Normal auditory acuity Mouth: No deformity or lesions Neck: Supple, no masses or thyromegaly Lungs: Clear throughout to auscultation Heart: Regular rate and rhythm; no murmurs, rubs or bruits Abdomen: Soft, non tender and non distended. No masses, hepatosplenomegaly or hernias noted. Normal Bowel sounds.  There is no succussion splash Rectal:deferred Musculoskeletal: Symmetrical with no gross deformities  Skin: No lesions on visible extremities Pulses:  Normal pulses noted Extremities: No clubbing, cyanosis, edema or deformities noted Neurological: Alert oriented x 4, grossly nonfocal Cervical Nodes:  No significant cervical adenopathy Inguinal Nodes: No significant inguinal adenopathy Psychological:  Alert and cooperative. Normal mood and affect

## 2013-04-10 NOTE — Patient Instructions (Addendum)
Follow up in 3-4 weeks Research will be speaking with you today

## 2013-04-10 NOTE — Assessment & Plan Note (Signed)
Patient's dyspepsia could be mainly do to esophageal reflux.  Recommendations #1 trial of Nexium 40 mg daily

## 2013-05-02 ENCOUNTER — Other Ambulatory Visit: Payer: 59

## 2013-05-02 ENCOUNTER — Other Ambulatory Visit: Payer: Self-pay

## 2013-05-02 DIAGNOSIS — D649 Anemia, unspecified: Secondary | ICD-10-CM

## 2013-05-02 DIAGNOSIS — Z1231 Encounter for screening mammogram for malignant neoplasm of breast: Secondary | ICD-10-CM

## 2013-05-02 LAB — CBC WITH DIFFERENTIAL/PLATELET
Basophils Absolute: 0 10*3/uL (ref 0.0–0.1)
Eosinophils Absolute: 0 10*3/uL (ref 0.0–0.7)
Eosinophils Relative: 1 % (ref 0–5)
HCT: 32.9 % — ABNORMAL LOW (ref 36.0–46.0)
Lymphocytes Relative: 37 % (ref 12–46)
Lymphs Abs: 1.7 10*3/uL (ref 0.7–4.0)
MCH: 22.5 pg — ABNORMAL LOW (ref 26.0–34.0)
MCV: 68 fL — ABNORMAL LOW (ref 78.0–100.0)
Monocytes Absolute: 0.3 10*3/uL (ref 0.1–1.0)
RDW: 17.4 % — ABNORMAL HIGH (ref 11.5–15.5)
WBC: 4.5 10*3/uL (ref 4.0–10.5)

## 2013-05-07 ENCOUNTER — Ambulatory Visit
Admission: RE | Admit: 2013-05-07 | Discharge: 2013-05-07 | Disposition: A | Discharge status: Home / Self Care | Payer: 59 | Source: Ambulatory Visit

## 2013-05-07 DIAGNOSIS — Z1231 Encounter for screening mammogram for malignant neoplasm of breast: Secondary | ICD-10-CM

## 2013-06-06 ENCOUNTER — Encounter: Payer: 59 | Admitting: Gynecology

## 2013-06-13 ENCOUNTER — Other Ambulatory Visit (HOSPITAL_COMMUNITY)
Admission: RE | Admit: 2013-06-13 | Discharge: 2013-06-13 | Disposition: A | Discharge status: Home / Self Care | Payer: 59 | Source: Ambulatory Visit | Attending: Gynecology | Admitting: Gynecology

## 2013-06-13 ENCOUNTER — Ambulatory Visit (INDEPENDENT_AMBULATORY_CARE_PROVIDER_SITE_OTHER): Payer: 59 | Admitting: Gynecology

## 2013-06-13 ENCOUNTER — Encounter: Payer: Self-pay | Admitting: Gynecology

## 2013-06-13 VITALS — BP 150/90 | Ht 66.5 in | Wt 178.0 lb

## 2013-06-13 DIAGNOSIS — D649 Anemia, unspecified: Secondary | ICD-10-CM

## 2013-06-13 DIAGNOSIS — Z1151 Encounter for screening for human papillomavirus (HPV): Secondary | ICD-10-CM | POA: Insufficient documentation

## 2013-06-13 DIAGNOSIS — G43909 Migraine, unspecified, not intractable, without status migrainosus: Secondary | ICD-10-CM

## 2013-06-13 DIAGNOSIS — R102 Pelvic and perineal pain: Secondary | ICD-10-CM

## 2013-06-13 DIAGNOSIS — K5900 Constipation, unspecified: Secondary | ICD-10-CM

## 2013-06-13 DIAGNOSIS — N949 Unspecified condition associated with female genital organs and menstrual cycle: Secondary | ICD-10-CM

## 2013-06-13 DIAGNOSIS — Z01419 Encounter for gynecological examination (general) (routine) without abnormal findings: Secondary | ICD-10-CM | POA: Insufficient documentation

## 2013-06-13 HISTORY — DX: Migraine, unspecified, not intractable, without status migrainosus: G43.909

## 2013-06-13 MED ORDER — LINACLOTIDE 145 MCG PO CAPS: 145.0000 ug | ORAL_CAPSULE | Freq: Every day | ORAL | Status: DC

## 2013-06-13 MED ORDER — SUMATRIPTAN-NAPROXEN SODIUM 85-500 MG PO TABS: 1.0000 | ORAL_TABLET | ORAL | Status: DC | PRN

## 2013-06-13 NOTE — Progress Notes (Signed)
Stacey Mendoza 07-20-1965 [id number]   History:    48 y.o.  for annual gyn exam who has not had a gynecological examination in many years. She was seen in June of 2014 complaining of midepigastric discomfort along with bloating after eating meals. She was referred to a gastroenterologist. Because of her constipation she induced the MiraLax as well as magnesium citrate in the past with no improvement. Patient denied any family history colorectal cancer. She did have one niece that had liver cancer. Her sister was diagnosed with breast cancer at the age of 39. She was started on Linzess 145 mcg capsule to take once daily her chronic idiopathic constipation symptoms. She has not returned back to see the gastroenterologist. Patient's last Pap smear was normal in 2004. She suffers from migraines 3-4 times a month. She reports normal menstrual cycles very light and lasting 3 days. She is using condoms for contraception. She denies any past history of sexually-transmitted disease.  Patient's menarche was age 66. She never breast-fed. She's had 3 children. Her youngest child was delivered at the age of 77.  Review of her right indicated in December 2014 her hemoglobin was 10.9 and hematocrit 32.9 she's currently on iron supplementation recently started twice a day.  Past medical history,surgical history, family history and social history were all reviewed and documented in the EPIC chart.  Gynecologic History Patient's last menstrual period was 05/28/2013. Contraception: condoms Last Pap: 2012. Results were: normal Last mammogram: 2014. Results were: Normal but dense  Obstetric History OB History  Gravida Para Term Preterm AB SAB TAB Ectopic Multiple Living  3 3        3     # Outcome Date GA Lbr Len/2nd Weight Sex Delivery Anes PTL Lv  3 PAR           2 PAR           1 PAR                ROS: A ROS was performed and pertinent positives and negatives are included in the history.  GENERAL:  No fevers or chills. HEENT: No change in vision, no earache, sore throat or sinus congestion. NECK: No pain or stiffness. CARDIOVASCULAR: No chest pain or pressure. No palpitations. PULMONARY: No shortness of breath, cough or wheeze. GASTROINTESTINAL: No abdominal pain, nausea, vomiting or diarrhea, melena or bright red blood per rectum. GENITOURINARY: No urinary frequency, urgency, hesitancy or dysuria. MUSCULOSKELETAL: No joint or muscle pain, no back pain, no recent trauma. DERMATOLOGIC: No rash, no itching, no lesions. ENDOCRINE: No polyuria, polydipsia, no heat or cold intolerance. No recent change in weight. HEMATOLOGICAL: No anemia or easy bruising or bleeding. NEUROLOGIC: No headache, seizures, numbness, tingling or weakness. PSYCHIATRIC: No depression, no loss of interest in normal activity or change in sleep pattern.     Exam: chaperone present  BP 150/90  Ht 5' 6.5" (1.689 m)  Wt 178 lb (80.74 kg)  BMI 28.30 kg/m2  LMP 05/28/2013  Body mass index is 28.3 kg/(m^2).  General appearance : Well developed well nourished female. No acute distress HEENT: Neck supple, trachea midline, no carotid bruits, no thyroidmegaly Lungs: Clear to auscultation, no rhonchi or wheezes, or rib retractions  Heart: Regular rate and rhythm, no murmurs or gallops Breast:Examined in sitting and supine position were symmetrical in appearance, no palpable masses or tenderness,  no skin retraction, no nipple inversion, no nipple discharge, no skin discoloration, no axillary or supraclavicular lymphadenopathy Abdomen: no  palpable masses or tenderness, no rebound or guarding Extremities: no edema or skin discoloration or tenderness  Pelvic:  Bartholin, Urethra, Skene Glands: Within normal limits             Vagina: No gross lesions or discharge  Cervix: No gross lesions or discharge  Uterus  anteverted, normal size, shape and consistency, non-tender and mobile  Adnexa  Without masses or tenderness  Anus and  perineum  normal   Rectovaginal  normal sphincter tone without palpated masses or tenderness             Hemoccult cards provided     Assessment/Plan:  48 y.o. female for annual exam with chronic anemia. No evidence of menorrhagia or intermenstrual bleeding. Normal pelvic exam today. Patient was initially evaluated by the gastroenterologist and I have encouraged her to return back to see him so that they can schedule a colonoscopy and possible upper endoscopy to determine the source of her anemia. She was reminded of her monthly breast exam. She was provided with stool cards to submit to the office for testing. The follow labs were ordered today: Fasting lipid profile, comprehensive metabolic panel, TSH, urinalysis and Pap smear. For her migraines and she was prescribed Treximet 85-500 mg tablet to take one at the onset of her migraine and may repeat a second dose and 2 hours but not to exceed 2 tablets in 24 hours. We discussed importance of calcium and vitamin D a regular exercise for osteoporosis.  Note: This dictation was prepared with  Dragon/digital dictation along withSmart phrase technology. Any transcriptional errors that result from this process are unintentional.   Terrance Mass MD, 12:05 PM 06/13/2013

## 2013-06-13 NOTE — Patient Instructions (Signed)

## 2013-06-14 LAB — COMPREHENSIVE METABOLIC PANEL
ALBUMIN: 4.3 g/dL (ref 3.5–5.2)
ALK PHOS: 53 U/L (ref 39–117)
ALT: 66 U/L — AB (ref 0–35)
AST: 45 U/L — AB (ref 0–37)
BILIRUBIN TOTAL: 0.9 mg/dL (ref 0.2–1.2)
BUN: 13 mg/dL (ref 6–23)
CO2: 23 mEq/L (ref 19–32)
Calcium: 9.4 mg/dL (ref 8.4–10.5)
Chloride: 104 mEq/L (ref 96–112)
Creat: 0.78 mg/dL (ref 0.50–1.10)
GLUCOSE: 87 mg/dL (ref 70–99)
POTASSIUM: 3.8 meq/L (ref 3.5–5.3)
SODIUM: 134 meq/L — AB (ref 135–145)
TOTAL PROTEIN: 7 g/dL (ref 6.0–8.3)

## 2013-06-14 LAB — LIPID PANEL
Cholesterol: 207 mg/dL — ABNORMAL HIGH (ref 0–200)
HDL: 72 mg/dL (ref >39)
LDL CALC: 121 mg/dL — AB (ref 0–99)
TRIGLYCERIDES: 68 mg/dL (ref <150)
Total CHOL/HDL Ratio: 2.9 Ratio
VLDL: 14 mg/dL (ref 0–40)

## 2013-06-14 LAB — URINALYSIS W MICROSCOPIC + REFLEX CULTURE
Bacteria, UA: NONE SEEN
Bilirubin Urine: NEGATIVE
CRYSTALS: NONE SEEN
Casts: NONE SEEN
Glucose, UA: NEGATIVE mg/dL
Hgb urine dipstick: NEGATIVE
Ketones, ur: NEGATIVE mg/dL
NITRITE: NEGATIVE
PROTEIN: NEGATIVE mg/dL
SPECIFIC GRAVITY, URINE: 1.013 (ref 1.005–1.030)
UROBILINOGEN UA: 0.2 mg/dL (ref 0.0–1.0)
pH: 6 (ref 5.0–8.0)

## 2013-06-14 LAB — TSH: TSH: 1.043 u[IU]/mL (ref 0.350–4.500)

## 2013-06-15 LAB — URINE CULTURE
Colony Count: NO GROWTH
ORGANISM ID, BACTERIA: NO GROWTH

## 2013-06-29 ENCOUNTER — Encounter: Payer: Self-pay | Admitting: Gastroenterology

## 2013-06-29 ENCOUNTER — Other Ambulatory Visit: Payer: 59

## 2013-06-29 ENCOUNTER — Ambulatory Visit (INDEPENDENT_AMBULATORY_CARE_PROVIDER_SITE_OTHER): Payer: 59 | Admitting: Gastroenterology

## 2013-06-29 VITALS — BP 120/70 | HR 66 | Ht 65.0 in | Wt 171.2 lb

## 2013-06-29 DIAGNOSIS — R945 Abnormal results of liver function studies: Secondary | ICD-10-CM | POA: Insufficient documentation

## 2013-06-29 DIAGNOSIS — R1013 Epigastric pain: Secondary | ICD-10-CM

## 2013-06-29 DIAGNOSIS — R7989 Other specified abnormal findings of blood chemistry: Secondary | ICD-10-CM

## 2013-06-29 DIAGNOSIS — R1031 Right lower quadrant pain: Secondary | ICD-10-CM

## 2013-06-29 DIAGNOSIS — K3189 Other diseases of stomach and duodenum: Secondary | ICD-10-CM

## 2013-06-29 HISTORY — DX: Right lower quadrant pain: R10.31

## 2013-06-29 HISTORY — DX: Abnormal results of liver function studies: R94.5

## 2013-06-29 LAB — HEPATIC FUNCTION PANEL
ALBUMIN: 3.9 g/dL (ref 3.5–5.2)
ALK PHOS: 49 U/L (ref 39–117)
ALT: 19 U/L (ref 0–35)
AST: 19 U/L (ref 0–37)
Bilirubin, Direct: 0 mg/dL (ref 0.0–0.3)
TOTAL PROTEIN: 7.2 g/dL (ref 6.0–8.3)
Total Bilirubin: 0.9 mg/dL (ref 0.3–1.2)

## 2013-06-29 LAB — HEPATITIS C ANTIBODY: HCV AB: NEGATIVE

## 2013-06-29 LAB — HEPATITIS B SURFACE ANTIBODY,QUALITATIVE: Hep B S Ab: POSITIVE — AB

## 2013-06-29 LAB — HEPATITIS A ANTIBODY, TOTAL: HEP A TOTAL AB: REACTIVE — AB

## 2013-06-29 LAB — HEPATITIS B SURFACE ANTIGEN: Hepatitis B Surface Ag: NEGATIVE

## 2013-06-29 MED ORDER — IBUPROFEN 200 MG PO TABS: 800.0000 mg | ORAL_TABLET | Freq: Four times a day (QID) | ORAL | Status: DC | PRN

## 2013-06-29 NOTE — Patient Instructions (Addendum)
We will send in your script to your pharmacy  Go to the basement for labs today

## 2013-06-29 NOTE — Assessment & Plan Note (Signed)
The patient has a very mild transaminitis without clear etiology.  There is no evidence for hepatic steatosis by ultrasound.  I will check serologies for hepatitis A., B. and C.

## 2013-06-29 NOTE — Assessment & Plan Note (Signed)
Symptoms have resolved since starting Nexium.  Plan no further GI workup.

## 2013-06-29 NOTE — Progress Notes (Signed)
          History of Present Illness:  The patient has returned for followup of dyspepsia.  Symptoms have subsided.  She takes Nexium regularly and feels that this medicine has relieved her symptoms.  She has occasional right lower abdomen pain.  It usually is associated with her menstrual periods although it may occur spontaneously as well.  She associates this pain as pain that she had previously with uterine polyps.  GYN exam apparently was normal.  Abdominal pain is unrelated to moving her bowels.  She's had no rectal bleeding.  Blood work was pertinent for an ALT of 66 and AST 45.  Abdominal ultrasound in June, 2014 was unremarkable.  There is no history of hepatitis, drug use or alcohol abuse.    Review of Systems: Pertinent positive and negative review of systems were noted in the above HPI section. All other review of systems were otherwise negative.    Current Medications, Allergies, Past Medical History, Past Surgical History, Family History and Social History were reviewed in [facility] record  Vital signs were reviewed in today's medical record. Physical Exam: General: Well developed , well nourished, no acute distress Skin: anicteric Abdomen is without masses, tenderness organomegaly  See Assessment and Plan under Problem List

## 2013-06-29 NOTE — Assessment & Plan Note (Signed)
I do not think that pain is related to constipation.  I still suspect that may be GYN in origin although there is no apparent abnormality by GYN exam.  Patient has submitted stool Hemoccults.  If this is negative I would not pursue her GI workup any further.

## 2013-07-02 NOTE — Progress Notes (Signed)
Quick Note:  Please inform the patient that lab work was normal and to continue current plan of action ______ 

## 2013-07-19 ENCOUNTER — Telehealth: Payer: Self-pay | Admitting: *Deleted

## 2013-07-19 MED ORDER — IBUPROFEN 800 MG PO TABS: 800.0000 mg | ORAL_TABLET | Freq: Three times a day (TID) | ORAL | Status: DC | PRN

## 2013-07-19 NOTE — Telephone Encounter (Signed)
Pt calling c/o bad menstrual cramps and would like a Rx for Mortin 800 mg.. Please advise

## 2013-07-19 NOTE — Telephone Encounter (Signed)
Please call in Motrin 800 mg one PO TID PRN #30 refill x 5

## 2013-07-19 NOTE — Telephone Encounter (Signed)
Pt informed, rx sent 

## 2013-08-01 ENCOUNTER — Ambulatory Visit: Payer: 59 | Admitting: Gastroenterology

## 2013-08-02 ENCOUNTER — Other Ambulatory Visit: Payer: 59 | Admitting: Anesthesiology

## 2013-08-02 DIAGNOSIS — Z1211 Encounter for screening for malignant neoplasm of colon: Secondary | ICD-10-CM

## 2013-08-13 ENCOUNTER — Ambulatory Visit (INDEPENDENT_AMBULATORY_CARE_PROVIDER_SITE_OTHER): Payer: 59 | Admitting: Gynecology

## 2013-08-13 ENCOUNTER — Other Ambulatory Visit: Payer: Self-pay | Admitting: Gynecology

## 2013-08-13 ENCOUNTER — Ambulatory Visit (INDEPENDENT_AMBULATORY_CARE_PROVIDER_SITE_OTHER): Payer: 59

## 2013-08-13 VITALS — BP 130/80

## 2013-08-13 DIAGNOSIS — R102 Pelvic and perineal pain unspecified side: Secondary | ICD-10-CM

## 2013-08-13 DIAGNOSIS — N946 Dysmenorrhea, unspecified: Secondary | ICD-10-CM

## 2013-08-13 DIAGNOSIS — D252 Subserosal leiomyoma of uterus: Secondary | ICD-10-CM

## 2013-08-13 DIAGNOSIS — N949 Unspecified condition associated with female genital organs and menstrual cycle: Secondary | ICD-10-CM

## 2013-08-13 DIAGNOSIS — N644 Mastodynia: Secondary | ICD-10-CM

## 2013-08-13 DIAGNOSIS — D259 Leiomyoma of uterus, unspecified: Secondary | ICD-10-CM

## 2013-08-13 DIAGNOSIS — N831 Corpus luteum cyst of ovary, unspecified side: Secondary | ICD-10-CM

## 2013-08-13 DIAGNOSIS — D251 Intramural leiomyoma of uterus: Secondary | ICD-10-CM

## 2013-08-13 MED ORDER — TRAMADOL HCL 50 MG PO TABS: 50.0000 mg | ORAL_TABLET | Freq: Four times a day (QID) | ORAL | Status: DC | PRN

## 2013-08-13 NOTE — Progress Notes (Signed)
   Patient is a 48 year old who presented to the office today complaining for 4 days of right lateral breast tenderness which resolved this morning. She denies any recent injury or any nipple discharge or any palpable masses. Patient had a normal mammogram in December 2014. Her sister had breast cancer at the age of 76. Patient also complaining of right lower quadrant discomfort a few days before her menses and lasting for 2-3 days. She denies any dyspareunia or any pain at any time during the month. Patient with previous tubal sterilization procedure and also using condoms for contraception.  Exam: Both breasts are examined sitting supine position both breasts are symmetrical in appearance the skin discoloration or nipple inversion a palpable mass or tenderness no supraclavicular axillary lymphadenopathy.   Ultrasound today: Uterus measuring 9.3 x 6.9 x 5.6 cm with endometrial stripe of 6.3 mm. Patient had 1 small intramural fibroid measuring 14 x 11 mm and a right subserous fibroid measuring 19 x 20 mm. Right ovary was normal left ovary thick wall corpus luteum cyst was positive color flow Doppler was noted the periphery measuring 19 x 19 x 15 mm. Minimal fluid was seen the cul-de-sac.  Assessment/plan: Nonspecific recent breast tenderness. Recent mammogram normal. Breast exam today normal. Patient asymptomatic. Patient was reassured and recommended to continue to do her monthly breast exams. When she goes for her next mammogram she was reminded to schedule for a 3-D due to the fact that her breasts were dense or last mammogram 3 months ago. It appear also that she suffer from mittelschmerz and states that Motrin sometimes help. Were going to give her a trial ultrasound 50 mg one by mouth every 6 hours when necessary.

## 2013-08-13 NOTE — Patient Instructions (Addendum)
Transvaginal Ultrasound Transvaginal ultrasound is a pelvic ultrasound, using a metal probe that is placed in the vagina, to look at a women's female organs. Transvaginal ultrasound is a method of seeing inside the pelvis of a woman. The ultrasound machine sends out sound waves from the transducer (probe). These sound waves bounce off body structures (like an echo) to create a picture. The picture shows up on a monitor. It is called transvaginal because the probe is inserted into the vagina. There should be very little discomfort from the vaginal probe. This test can also be used during pregnancy. Endovaginal ultrasound is another name for a transvaginal ultrasound. In a transabdominal ultrasound, the probe is placed on the outside of the belly. This method gives pictures that are lower quality than pictures from the transvaginal technique. Transvaginal ultrasound is used to look for problems of the female genital tract. Some such problems include:  Infertility problems.  Congenital (birth defect) malformations of the uterus and ovaries.  Tumors in the uterus.  Abnormal bleeding.  Ovarian tumors and cysts.  Abscess (inflamed tissue around pus) in the pelvis.  Unexplained abdominal or pelvic pain.  Pelvic infection. PROCEDURE  You will lie down on a table, with your knees bent and your feet in foot holders. The probe is covered with a condom. A sterile lubricant is put into the vagina and on the probe. The lubricant helps transmit the sound waves and avoid irritating the vagina. Your caregiver will move the probe inside the vaginal cavity to scan the pelvic structures. A normal test will show a normal pelvis and normal contents. An abnormal test will show abnormalities of the pelvis, placenta, or baby. ABNORMAL RESULTS MAY BE DUE TO:  Growths or tumors in the:  Uterus.  Ovaries.  Vagina.  Other pelvic structures.  Non-cancerous growths of the uterus and ovaries.  Twisting of the  ovary, cutting off blood supply to the ovary (ovarian torsion).  Areas of infection, including:  Pelvic inflammatory disease.  Abscess in the pelvis.  Locating an IUD. RISKS AND COMPLICATIONS There are no known risks to the ultrasound procedure. There is no X-ray used when doing an ultrasound. Document Released: 04/14/2004 Document Revised: 07/26/2011 Document Reviewed: 04/02/2009 [facility] Patient Information 2014 [place], [place]. Uterine Fibroid A uterine fibroid is a growth (tumor) that occurs in your uterus. This type of tumor is not cancerous and does not spread out of the uterus. You can have one or many fibroids. Fibroids can vary in size, weight, and where they grow in the uterus. Some can become quite large. Most fibroids do not require medical treatment, but some can cause pain or heavy bleeding during and between periods. CAUSES  A fibroid is the result of a single uterine cell that keeps growing (unregulated), which is different than most cells in the human body. Most cells have a control mechanism that keeps them from reproducing without control.  SIGNS AND SYMPTOMS   Bleeding.  Pelvic pain and pressure.  Bladder problems due to the size of the fibroid.  Infertility and miscarriages depending on the size and location of the fibroid. DIAGNOSIS  Uterine fibroids are diagnosed through a physical exam. Your health care provider may feel the lumpy tumors during a pelvic exam. Ultrasonography may be done to get information regarding size, location, and number of tumors.  TREATMENT   Your health care provider may recommend watchful waiting. This involves getting the fibroid checked by your health care provider to see if it grows or shrinks.  Hormone treatment or an intrauterine device (IUD) may be prescribed.   Surgery may be needed to remove the fibroids (myomectomy) or the uterus (hysterectomy). This depends on your situation. When fibroids interfere with fertility and a  woman wants to become pregnant, a health care provider may recommend having the fibroids removed.  Ruth care depends on how you were treated. In general:   Keep all follow-up appointments with your health care provider.   Only take over-the-counter or prescription medicines as directed by your health care provider. If you were prescribed a hormone treatment, take the hormone medicines exactly as directed. Do not take aspirin. It can cause bleeding.   Talk to your health care provider about taking iron pills.  If your periods are troublesome but not so heavy, lie down with your feet raised slightly above your heart. Place cold packs on your lower abdomen.   If your periods are heavy, write down the number of pads or tampons you use per month. Bring this information to your health care provider.   Include green vegetables in your diet.  SEEK IMMEDIATE MEDICAL CARE IF:  You have pelvic pain or cramps not controlled with medicines.   You have a sudden increase in pelvic pain.   You have an increase in bleeding between and during periods.   You have excessive periods and soak tampons or pads in a half hour or less.  You feel lightheaded or have fainting episodes. Document Released: 04/30/2000 Document Revised: 02/21/2013 Document Reviewed: 11/30/2012 [facility] Patient Information 2014 [place], [place]. Tramadol extended release tablets or capsules What is this medicine? TRAMADOL (TRA ma dole) is a pain reliever. It is used to treat moderate to severe pain in adults. This medicine may be used for other purposes; ask your health care provider or pharmacist if you have questions. COMMON BRAND NAME(S): ConZip, Ryzolt, Ultram ER What should I tell my health care provider before I take this medicine? They need to know if you have any of these conditions: -brain tumor -drink more than 3 alcohol-containing drinks per day -drug abuse or addiction -head  injury -kidney disease or problems going to the bathroom -liver disease -lung disease, asthma, or breathing problems -seizures or epilepsy -an unusual or allergic reaction to tramadol, codeine, other medicines, foods, dyes, or preservatives -pregnant or trying to get pregnant -breast-feeding How should I use this medicine? Take this medicine by mouth with a glass of water. Follow the directions on the prescription label. Do not cut, crush or chew this medicine. Crushing this medicine may cause overdose and death. This risk is increased in patients who abuse alcohol or other substances. Take this medicine the same way each day, either with food or not. If the medicine upsets your stomach, take it with food or milk. Take your medicine at regular intervals. Do not take it more often than directed. Talk to your pediatrician regarding the use of this medicine in children. This medicine is not approved for use in children. Overdosage: If you think you have taken too much of this medicine contact a poison control center or emergency room at once. NOTE: This medicine is only for you. Do not share this medicine with others. What if I miss a dose? If you miss a dose, take it as soon as you can. If it is almost time for your next dose, take only that dose. Do not take double or extra doses. What may interact with this medicine? Do not take this  medicine with any of the following medications: -MAOIs like Carbex, Eldepryl, Marplan, Nardil, and Parnate This medicine may also interact with the following medications: -alcohol or medicines that contain alcohol -antihistamines -benzodiazepines -bupropion -carbamazepine or oxcarbazepine -clozapine -cyclobenzaprine -digoxin -furazolidone -linezolid -medicines for depression, anxiety, or psychotic disturbances -medicines for migraine headache like almotriptan, eletriptan, frovatriptan, naratriptan, rizatriptan, sumatriptan, zolmitriptan -medicines for pain  like pentazocine, buprenorphine, butorphanol, meperidine, nalbuphine, and propoxyphene -medicines for sleep -muscle relaxants -naltrexone -phenobarbital -phenothiazines like perphenazine, thioridazine, chlorpromazine, mesoridazine, fluphenazine, prochlorperazine, promazine, and trifluoperazine -procarbazine -warfarin This list may not describe all possible interactions. Give your health care provider a list of all the medicines, herbs, non-prescription drugs, or dietary supplements you use. Also tell them if you smoke, drink alcohol, or use illegal drugs. Some items may interact with your medicine. What should I watch for while using this medicine? Tell your doctor or health care professional if your pain does not go away, if it gets worse, or if you have new or a different type of pain. You may develop tolerance to the medicine. Tolerance means that you will need a higher dose of the medicine for pain relief. Tolerance is normal and is expected if you take this medicine for a long time. Do not suddenly stop taking your medicine because you may develop a severe reaction. Your body becomes used to the medicine. This does NOT mean you are addicted. Addiction is a behavior related to getting and using a drug for a non-medical reason. If you have pain, you have a medical reason to take pain medicine. Your doctor will tell you how much medicine to take. If your doctor wants you to stop the medicine, the dose will be slowly lowered over time to avoid any side effects. You may get drowsy or dizzy. Do not drive, use machinery, or do anything that needs mental alertness until you know how this medicine affects you. Do not stand or sit up quickly, especially if you are an older patient. This reduces the risk of dizzy or fainting spells. Alcohol can increase or decrease the effects of this medicine. Avoid alcoholic drinks. You may have constipation. Try to have a bowel movement at least every 2 to 3 days. If you  do not have a bowel movement for 3 days, call your doctor or health care professional. Your mouth may get dry. Chewing sugarless gum or sucking hard candy, and drinking plenty of water may help. Contact your doctor if the problem does not go away or is severe. What side effects may I notice from receiving this medicine? Side effects that you should report to your doctor or health care professional as soon as possible: -allergic reactions like skin rash, itching or hives, swelling of the face, lips, or tongue -breathing problems -confusion -feeling faint or lightheaded, falls -itching -redness, blistering, peeling or loosening of the skin, including inside the mouth -seizures Side effects that usually do not require medical attention (report to your doctor or health care professional if they continue or are bothersome): -constipation -dizziness -drowsiness -headache -nausea, vomiting This list may not describe all possible side effects. Call your doctor for medical advice about side effects. You may report side effects to FDA at 1-800-FDA-1088. Where should I keep my medicine? Keep out of the reach of children. Store at room temperature between 15 and 30 degrees C (59 and 86 degrees F). Keep container tightly closed. Throw away any unused medicine after the expiration date. NOTE: This sheet is a summary. It may  not cover all possible information. If you have questions about this medicine, talk to your doctor, pharmacist, or health care provider.  2014, [employer] Standard. (2010-01-14 12:27:40)

## 2013-11-28 ENCOUNTER — Ambulatory Visit (INDEPENDENT_AMBULATORY_CARE_PROVIDER_SITE_OTHER): Payer: 59 | Admitting: Gynecology

## 2013-11-28 ENCOUNTER — Encounter: Payer: Self-pay | Admitting: Gynecology

## 2013-11-28 VITALS — BP 130/86

## 2013-11-28 DIAGNOSIS — Z803 Family history of malignant neoplasm of breast: Secondary | ICD-10-CM

## 2013-11-28 DIAGNOSIS — G8929 Other chronic pain: Secondary | ICD-10-CM

## 2013-11-28 DIAGNOSIS — R1011 Right upper quadrant pain: Secondary | ICD-10-CM

## 2013-11-28 DIAGNOSIS — N644 Mastodynia: Secondary | ICD-10-CM

## 2013-11-28 HISTORY — DX: Other chronic pain: G89.29

## 2013-11-28 HISTORY — DX: Right upper quadrant pain: R10.11

## 2013-11-28 HISTORY — DX: Family history of malignant neoplasm of breast: Z80.3

## 2013-11-28 LAB — COMPREHENSIVE METABOLIC PANEL
ALBUMIN: 4.3 g/dL (ref 3.5–5.2)
ALT: 16 U/L (ref 0–35)
AST: 18 U/L (ref 0–37)
Alkaline Phosphatase: 67 U/L (ref 39–117)
BUN: 15 mg/dL (ref 6–23)
CALCIUM: 9.2 mg/dL (ref 8.4–10.5)
CO2: 24 meq/L (ref 19–32)
CREATININE: 0.94 mg/dL (ref 0.50–1.10)
Chloride: 107 mEq/L (ref 96–112)
Glucose, Bld: 92 mg/dL (ref 70–99)
Potassium: 4 mEq/L (ref 3.5–5.3)
Sodium: 137 mEq/L (ref 135–145)
Total Bilirubin: 0.5 mg/dL (ref 0.2–1.2)
Total Protein: 6.8 g/dL (ref 6.0–8.3)

## 2013-11-28 NOTE — Progress Notes (Addendum)
   Patient is a 48 year old who presented to the office today complaining of several days of right breast tenderness. Her cycle and did this past Sunday. Her sister was diagnosed with breast cancer and died at the age of 75. Patient's last mammogram was in December 2014 which was normal although heterogeneously dense. Patient thought she felt a little nodule a few days ago. She denied any nipple discharge. She denied any recent injury or trauma to the area. Patient was seen in March of this year with similar situation and exam was totally benign. Patient is having normal menstrual cycles and using condoms for contraception  Patient also complaining of right upper quadrant discomfort at times the pain on her fatty in taking her meals. She denies any change in her stool.  Exam: Breasts: Both breasts are examined sitting supine position both appear to be symmetrical in appearance no skin discoloration no nipple inversion and no palpable masses or tenderness or axillary or supraclavicular lymphadenopathy on the left breast. Right breast tenderness and slightly irregular texture 4 fingerbreadths from the nipple on the right breast at the 3:00 position. No discernible mass per se. There was no supraclavicular or axillary lymphadenopathy on that breast.  Abdomen: Soft nontender no rebound or guarding  Assessment /plan: #1 patient with tenderness on the right breast at the 3:00 position 4-5 finger breaths from the nipple. Patient with strong family history of breast cancer in her sister at the age of 72. Normal mammogram although dense in December 2014. We will send patient for a diagnostic mammogram (3-D) and ultrasound of that breast. #2 because right upper quadrant discomfort associated with fatty foods we will schedule an upper double ultrasound to rule out cholelithiasis.

## 2013-11-29 ENCOUNTER — Telehealth: Payer: Self-pay | Admitting: *Deleted

## 2013-11-29 DIAGNOSIS — N644 Mastodynia: Secondary | ICD-10-CM

## 2013-11-29 DIAGNOSIS — K8010 Calculus of gallbladder with chronic cholecystitis without obstruction: Secondary | ICD-10-CM

## 2013-11-29 NOTE — Telephone Encounter (Signed)
Appointment for ultrasound scheduled on 12/03/13 @ 7:30 am at [facility] hospital pt aware she must be NPO after midnight. Pt will call breast center to schedule appointment since orders have been placed.

## 2013-11-29 NOTE — Telephone Encounter (Signed)
Message copied by Thamas Jaegers on Thu Nov 29, 2013  9:16 AM ------      Message from: Terrance Mass      Created: Wed Nov 28, 2013  3:45 PM       Anderson Malta, please schedule diagnostic mammogram and ultrasound on this patient following history:             Assessment /plan:      #1 patient with tenderness on the right breast at the 3:00 position 4-5 finger breaths from the nipple. Patient with strong family history of breast cancer in her sister at the age of 31. Normal mammogram although dense in December 2014. We will send patient for a diagnostic mammogram (3-D) and ultrasound of that breast.      #2 because right upper quadrant discomfort associated with fatty foods we will schedule an upper double ultrasound to rule out cholelithiasis.       ------

## 2013-12-03 ENCOUNTER — Ambulatory Visit (HOSPITAL_COMMUNITY)
Admission: RE | Admit: 2013-12-03 | Discharge: 2013-12-03 | Disposition: A | Discharge status: Home / Self Care | Payer: 59 | Source: Ambulatory Visit | Attending: Gynecology | Admitting: Gynecology

## 2013-12-03 DIAGNOSIS — K8010 Calculus of gallbladder with chronic cholecystitis without obstruction: Secondary | ICD-10-CM

## 2013-12-03 DIAGNOSIS — Q619 Cystic kidney disease, unspecified: Secondary | ICD-10-CM | POA: Insufficient documentation

## 2013-12-03 DIAGNOSIS — R1011 Right upper quadrant pain: Secondary | ICD-10-CM | POA: Insufficient documentation

## 2013-12-04 ENCOUNTER — Telehealth: Payer: Self-pay | Admitting: *Deleted

## 2013-12-04 NOTE — Telephone Encounter (Signed)
Pt informed normal ultrasound on 12/03/13 and normal cmet on 11/28/13

## 2013-12-05 ENCOUNTER — Ambulatory Visit
Admission: RE | Admit: 2013-12-05 | Discharge: 2013-12-05 | Disposition: A | Discharge status: Home / Self Care | Payer: 59 | Source: Ambulatory Visit | Attending: Gynecology | Admitting: Gynecology

## 2013-12-05 ENCOUNTER — Other Ambulatory Visit: Payer: Self-pay | Admitting: Gynecology

## 2013-12-05 DIAGNOSIS — N644 Mastodynia: Secondary | ICD-10-CM

## 2014-03-18 ENCOUNTER — Encounter: Payer: Self-pay | Admitting: Gynecology

## 2014-04-01 ENCOUNTER — Ambulatory Visit: Payer: Self-pay

## 2014-04-01 ENCOUNTER — Other Ambulatory Visit: Payer: Self-pay | Admitting: Occupational Medicine

## 2014-04-01 DIAGNOSIS — R52 Pain, unspecified: Secondary | ICD-10-CM

## 2014-04-08 ENCOUNTER — Other Ambulatory Visit: Payer: Self-pay

## 2014-04-08 DIAGNOSIS — Z1231 Encounter for screening mammogram for malignant neoplasm of breast: Secondary | ICD-10-CM

## 2014-04-15 ENCOUNTER — Other Ambulatory Visit: Payer: Self-pay

## 2014-04-15 MED ORDER — TRAMADOL HCL 50 MG PO TABS: 50.0000 mg | ORAL_TABLET | Freq: Four times a day (QID) | ORAL | Status: DC | PRN

## 2014-04-15 NOTE — Telephone Encounter (Signed)
Called into pharmacy

## 2014-04-16 ENCOUNTER — Other Ambulatory Visit: Payer: Self-pay | Admitting: *Deleted

## 2014-04-16 MED ORDER — ESOMEPRAZOLE MAGNESIUM 40 MG PO CPDR: 40.0000 mg | DELAYED_RELEASE_CAPSULE | Freq: Every day | ORAL | Status: DC

## 2014-05-08 ENCOUNTER — Ambulatory Visit
Admission: RE | Admit: 2014-05-08 | Discharge: 2014-05-08 | Disposition: A | Discharge status: Home / Self Care | Payer: 59 | Source: Ambulatory Visit

## 2014-05-08 DIAGNOSIS — Z1231 Encounter for screening mammogram for malignant neoplasm of breast: Secondary | ICD-10-CM

## 2014-07-17 ENCOUNTER — Other Ambulatory Visit: Payer: Self-pay | Admitting: Gynecology

## 2014-07-17 NOTE — Telephone Encounter (Signed)
Last RGCE Jun 13 2013 and patient is not scheduled currently.

## 2014-09-05 ENCOUNTER — Encounter (HOSPITAL_BASED_OUTPATIENT_CLINIC_OR_DEPARTMENT_OTHER): Payer: Self-pay | Admitting: Emergency Medicine

## 2014-09-05 ENCOUNTER — Emergency Department (HOSPITAL_BASED_OUTPATIENT_CLINIC_OR_DEPARTMENT_OTHER)
Admission: EM | Admit: 2014-09-05 | Discharge: 2014-09-06 | Disposition: A | Discharge status: Home / Self Care | Payer: 59 | Attending: Emergency Medicine | Admitting: Emergency Medicine

## 2014-09-05 ENCOUNTER — Emergency Department (HOSPITAL_BASED_OUTPATIENT_CLINIC_OR_DEPARTMENT_OTHER): Payer: 59

## 2014-09-05 DIAGNOSIS — Y998 Other external cause status: Secondary | ICD-10-CM | POA: Diagnosis not present

## 2014-09-05 DIAGNOSIS — S3992XA Unspecified injury of lower back, initial encounter: Secondary | ICD-10-CM | POA: Insufficient documentation

## 2014-09-05 DIAGNOSIS — Y9389 Activity, other specified: Secondary | ICD-10-CM | POA: Diagnosis not present

## 2014-09-05 DIAGNOSIS — Z8739 Personal history of other diseases of the musculoskeletal system and connective tissue: Secondary | ICD-10-CM | POA: Insufficient documentation

## 2014-09-05 DIAGNOSIS — D649 Anemia, unspecified: Secondary | ICD-10-CM | POA: Insufficient documentation

## 2014-09-05 DIAGNOSIS — Z79899 Other long term (current) drug therapy: Secondary | ICD-10-CM | POA: Diagnosis not present

## 2014-09-05 DIAGNOSIS — S199XXA Unspecified injury of neck, initial encounter: Secondary | ICD-10-CM | POA: Diagnosis not present

## 2014-09-05 DIAGNOSIS — Y92410 Unspecified street and highway as the place of occurrence of the external cause: Secondary | ICD-10-CM | POA: Insufficient documentation

## 2014-09-05 DIAGNOSIS — Z3202 Encounter for pregnancy test, result negative: Secondary | ICD-10-CM | POA: Insufficient documentation

## 2014-09-05 DIAGNOSIS — S29092A Other injury of muscle and tendon of back wall of thorax, initial encounter: Secondary | ICD-10-CM | POA: Insufficient documentation
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
## 2014-09-05 NOTE — ED Notes (Signed)
Patient brought in [place] on long spine board and c-collar. The patient has complaint of neck pain.

## 2014-09-06 DIAGNOSIS — S199XXA Unspecified injury of neck, initial encounter: Secondary | ICD-10-CM | POA: Diagnosis not present

## 2014-09-06 LAB — PREGNANCY, URINE: PREG TEST UR: NEGATIVE

## 2014-09-06 MED ORDER — OXYCODONE-ACETAMINOPHEN 5-325 MG PO TABS: 1.0000 | ORAL_TABLET | Freq: Four times a day (QID) | ORAL | Status: DC | PRN

## 2014-09-06 MED ORDER — OXYCODONE-ACETAMINOPHEN 5-325 MG PO TABS
1.0000 | ORAL_TABLET | Freq: Once | ORAL | Status: AC
Start: 2014-09-06 — End: 2014-09-06
  Administered 2014-09-06: 1 via ORAL
  Filled 2014-09-06: qty 1

## 2014-09-06 NOTE — Discharge Instructions (Signed)
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
## 2014-10-23 ENCOUNTER — Other Ambulatory Visit: Payer: Self-pay | Admitting: *Deleted

## 2014-10-30 ENCOUNTER — Other Ambulatory Visit: Payer: Self-pay

## 2014-10-30 MED ORDER — TRAMADOL HCL 50 MG PO TABS: 50.0000 mg | ORAL_TABLET | Freq: Four times a day (QID) | ORAL | Status: DC | PRN

## 2014-10-30 NOTE — Telephone Encounter (Signed)
Called into pharmacy

## 2014-12-11 DIAGNOSIS — Z0289 Encounter for other administrative examinations: Secondary | ICD-10-CM

## 2015-04-07 ENCOUNTER — Other Ambulatory Visit: Payer: Self-pay

## 2015-04-07 DIAGNOSIS — Z1231 Encounter for screening mammogram for malignant neoplasm of breast: Secondary | ICD-10-CM

## 2015-05-02 ENCOUNTER — Encounter: Payer: 59 | Admitting: Gynecology

## 2015-05-14 ENCOUNTER — Ambulatory Visit
Admission: RE | Admit: 2015-05-14 | Discharge: 2015-05-14 | Disposition: A | Discharge status: Home / Self Care | Payer: 59 | Source: Ambulatory Visit

## 2015-05-14 DIAGNOSIS — Z1231 Encounter for screening mammogram for malignant neoplasm of breast: Secondary | ICD-10-CM

## 2015-06-18 MED FILL — LINZESS 145 MCG CAPSULE: 145 | 30 days supply | Qty: 30 | Fill #2

## 2015-07-20 DIAGNOSIS — R509 Fever, unspecified: Secondary | ICD-10-CM | POA: Diagnosis not present

## 2015-07-20 DIAGNOSIS — J101 Influenza due to other identified influenza virus with other respiratory manifestations: Secondary | ICD-10-CM | POA: Diagnosis not present

## 2015-09-22 DIAGNOSIS — R630 Anorexia: Secondary | ICD-10-CM | POA: Diagnosis not present

## 2015-09-22 DIAGNOSIS — Z8249 Family history of ischemic heart disease and other diseases of the circulatory system: Secondary | ICD-10-CM | POA: Diagnosis not present

## 2015-09-22 DIAGNOSIS — I10 Essential (primary) hypertension: Secondary | ICD-10-CM | POA: Diagnosis not present

## 2015-09-22 DIAGNOSIS — G4700 Insomnia, unspecified: Secondary | ICD-10-CM | POA: Diagnosis not present

## 2015-09-22 MED FILL — traZODone HCL 50 MG TABS: 50 | 30 days supply | Qty: 30 | Fill #0

## 2015-09-22 MED FILL — LISINOPRIL-HCTZ 10-12.5 MG: 10-12.5 | 90 days supply | Qty: 90 | Fill #0

## 2015-09-23 MED FILL — TRETINOIN 0.025% CREAM: 0.025 | 90 days supply | Qty: 45 | Fill #2

## 2015-10-24 ENCOUNTER — Ambulatory Visit (INDEPENDENT_AMBULATORY_CARE_PROVIDER_SITE_OTHER): Payer: 59 | Admitting: Cardiovascular Disease

## 2015-10-24 ENCOUNTER — Encounter: Payer: Self-pay | Admitting: Cardiovascular Disease

## 2015-10-24 VITALS — BP 130/86 | HR 94 | Ht 64.0 in | Wt 178.1 lb

## 2015-10-24 DIAGNOSIS — R079 Chest pain, unspecified: Secondary | ICD-10-CM

## 2015-10-24 DIAGNOSIS — Z7689 Persons encountering health services in other specified circumstances: Secondary | ICD-10-CM

## 2015-10-24 DIAGNOSIS — Z7189 Other specified counseling: Secondary | ICD-10-CM | POA: Diagnosis not present

## 2015-10-24 DIAGNOSIS — R0600 Dyspnea, unspecified: Secondary | ICD-10-CM | POA: Diagnosis not present

## 2015-10-24 NOTE — Patient Instructions (Addendum)
Medication Instructions:  Your physician recommends that you continue on your current medications as directed. Please refer to the Current Medication list given to you today.  Labwork: NONE  Testing/Procedures: Your physician has requested that you have a stress echocardiogram. For further information please visit [email or web address]. Please follow instruction sheet as given.  Cardiac CT Calcium Score scanning, (CAT scanning), is a noninvasive, special x-ray that produces cross-sectional images of the body using x-rays and a computer. CT scans help physicians diagnose and treat medical conditions. For some CT exams, a contrast material is used to enhance visibility in the area of the body being studied. CT scans provide greater clarity and reveal more details than regular x-ray exams.  Please call [phone] to schedule.  Follow-Up: Your physician wants you to follow-up in: 12 months with Dr. Johnsie Cancel. You will receive a reminder letter in the mail two months in advance. If you don't receive a letter, please call our office to schedule the follow-up appointment.   If you need a refill on your cardiac medications before your next appointment, please call your pharmacy.

## 2015-10-24 NOTE — Progress Notes (Signed)
Patient ID: Stacey Mendoza, female   DOB: 10-31-1965, 50 y.o.   MRN: [id number]     Cardiology Office Note   Date:  10/24/2015   ID:  Stacey Mendoza, DOB 02-01-1966, MRN [id number]  PCP:  Elie Goody, MD  Cardiologist:   Jenkins Rouge, MD   No chief complaint on file.     History of Present Illness: Stacey Mendoza is a 50 y.o. female who presents for evaluation of CAD. Family history of premature CAD and TIA.  Sister with MI age age 72 Another Sister with TIA but this was in setting of uncontrolled HTN.  I take care of her mother who has CAD post stenting.  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  She has recently been started on BP meds but doesn't like the idea of being on them forever. More sedentary since fall 2 months ago. Previously use to work out well. Infrequent SSCP sharp fleeting sounds muscular and occasional exertional dyspnea.  Previous GERD But is not taking nexium now.      Past Medical History  Diagnosis Date  . Bulging disc     cervical MRI pending  . Normal vaginal delivery 1992, 1986, 1990  . Anemia   . Elevated LFTs     Past Surgical History  Procedure Laterality Date  . Tubal ligation       Current Outpatient Prescriptions  Medication Sig Dispense Refill  . esomeprazole (NEXIUM) 40 MG capsule Take 1 capsule (40 mg total) by mouth daily at 12 noon. 30 capsule 2  . esomeprazole (NEXIUM) 40 MG capsule Take 1 capsule (40 mg total) by mouth daily at 12 noon. 30 capsule 6  . ferrous sulfate 325 (65 FE) MG tablet Take 325 mg by mouth 2 (two) times daily with a meal.     . ibuprofen (ADVIL,MOTRIN) 800 MG tablet Take 1 tablet (800 mg total) by mouth every 8 (eight) hours as needed. 30 tablet 5  . ibuprofen (MOTRIN IB) 200 MG tablet Take 4 tablets (800 mg total) by mouth every 6 (six) hours as needed. 30 tablet 1  . LINZESS 145 MCG CAPS capsule TAKE 1 CAPSULE BY MOUTH DAILY. 30 capsule 3  . Multiple Vitamin (MULITIVITAMIN  WITH MINERALS) TABS Take 1 tablet by mouth daily.    Marland Kitchen oxyCODONE-acetaminophen (PERCOCET) 5-325 MG per tablet Take 1-2 tablets by mouth every 6 (six) hours as needed (for pain). 10 tablet 0  . SUMAtriptan-naproxen (TREXIMET) 85-500 MG per tablet Take 1 tablet by mouth every 2 (two) hours as needed for migraine. 10 tablet 0  . traMADol (ULTRAM) 50 MG tablet Take 1 tablet (50 mg total) by mouth every 6 (six) hours as needed. 30 tablet 2   No current facility-administered medications for this visit.    Allergies:   Vicodin    Social History:  The patient  reports that she has never smoked. She has never used smokeless tobacco. She reports that she does not drink alcohol or use illicit drugs.   Family History:  The patient's family history includes Breast cancer (age of onset: 66) in her sister; Cancer in her sister; Cancer (age of onset: 30) in her other; Diabetes in her mother; Heart disease in her father and mother; Hypertension in her brother, mother, sister, sister, and sister; Throat cancer in her mother. There is no history of Colon cancer.    ROS:  Please see the history of present illness.   Otherwise, review  of systems are positive for none.   All other systems are reviewed and negative.    PHYSICAL EXAM: VS:  There were no vitals taken for this visit. , BMI There is no weight on file to calculate BMI. Affect appropriate Healthy:  appears stated age 41: normal Neck supple with no adenopathy JVP normal no bruits no thyromegaly Lungs clear with no wheezing and good diaphragmatic motion Heart:  S1/S2 no murmur, no rub, gallop or click PMI normal Abdomen: benighn, BS positve, no tenderness, no AAA no bruit.  No HSM or HJR Distal pulses intact with no bruits No edema Neuro non-focal Skin warm and dry No muscular weakness    EKG:  09/2011  SR normal ECG  10/24/15 SR rate 93 nonspecific ST changes    Recent Labs: No results found for requested labs within last 365 days.     Lipid Panel    Component Value Date/Time   CHOL 207* 06/13/2013 1203   TRIG 68 06/13/2013 1203   HDL 72 06/13/2013 1203   CHOLHDL 2.9 06/13/2013 1203   VLDL 14 06/13/2013 1203   LDLCALC 121* 06/13/2013 1203      Wt Readings from Last 3 Encounters:  09/05/14 79.833 kg (176 lb)  06/29/13 77.656 kg (171 lb 3.2 oz)  06/13/13 80.74 kg (178 lb)      Other studies Reviewed: Additional studies/ records that were reviewed today include: Notes from [facility] .    ASSESSMENT AND PLAN:  1.Family History of CAD with atypical SSCP and exertional dyspnea Non-specific ST changes on ECG  f/u stress echo Also discussed calcium score with her  2. HTN:  Encouraged her to take her newly prescribed med and f/u with primary .  3. GERD: low carb diet PRN pepcid    Current medicines are reviewed at length with the patient today.  The patient does not have concerns regarding medicines.  The following changes have been made:  no change  Labs/ tests ordered today include: stress echo and calcium score   No orders of the defined types were placed in this encounter.     Disposition:   FU with me in a year      Signed, Jenkins Rouge, MD  10/24/2015 7:51 AM    [address], [place],   [address] Phone: [phone]; Fax: [phone]

## 2015-11-13 ENCOUNTER — Telehealth (HOSPITAL_COMMUNITY): Payer: Self-pay | Admitting: *Deleted

## 2015-11-13 NOTE — Telephone Encounter (Signed)
Patient given detailed instructions per Stress Test Requisition Sheet for test on 11/19/15 at 7:30.Patient Notified to arrive 30 minutes early, and that it is imperative to arrive on time for appointment to keep from having the test rescheduled.  Patient verbalized understanding. Stacey Mendoza

## 2015-11-19 ENCOUNTER — Ambulatory Visit (HOSPITAL_COMMUNITY): Payer: 59 | Attending: Cardiovascular Disease

## 2015-11-19 ENCOUNTER — Ambulatory Visit (HOSPITAL_COMMUNITY): Payer: 59

## 2015-11-19 DIAGNOSIS — R0600 Dyspnea, unspecified: Secondary | ICD-10-CM | POA: Diagnosis not present

## 2015-11-19 DIAGNOSIS — Z8249 Family history of ischemic heart disease and other diseases of the circulatory system: Secondary | ICD-10-CM | POA: Diagnosis not present

## 2015-11-19 DIAGNOSIS — R079 Chest pain, unspecified: Secondary | ICD-10-CM | POA: Insufficient documentation

## 2015-11-23 DIAGNOSIS — M545 Low back pain: Secondary | ICD-10-CM | POA: Diagnosis not present

## 2015-11-23 DIAGNOSIS — M25512 Pain in left shoulder: Secondary | ICD-10-CM | POA: Diagnosis not present

## 2016-01-01 MED FILL — LISINOPRIL-HCTZ 10-12.5 MG: 10-12.5 | 30 days supply | Qty: 30 | Fill #0

## 2016-01-07 DIAGNOSIS — I10 Essential (primary) hypertension: Secondary | ICD-10-CM | POA: Diagnosis not present

## 2016-01-07 DIAGNOSIS — J309 Allergic rhinitis, unspecified: Secondary | ICD-10-CM | POA: Diagnosis not present

## 2016-01-07 DIAGNOSIS — Z8249 Family history of ischemic heart disease and other diseases of the circulatory system: Secondary | ICD-10-CM | POA: Diagnosis not present

## 2016-01-07 DIAGNOSIS — G4700 Insomnia, unspecified: Secondary | ICD-10-CM | POA: Diagnosis not present

## 2016-01-07 DIAGNOSIS — D509 Iron deficiency anemia, unspecified: Secondary | ICD-10-CM | POA: Diagnosis not present

## 2016-01-07 MED FILL — hydrOXYzine HCL 25 MG TABS: 25 | 30 days supply | Qty: 30 | Fill #0

## 2016-01-07 MED FILL — LOSARTAN-HCTZ 50-12.5 MG TA: 50-12.5 | 90 days supply | Qty: 90 | Fill #0

## 2016-02-12 MED FILL — AMLODIPINE BESYLATE 5 MG TA: 5 | 30 days supply | Qty: 30 | Fill #0

## 2016-03-18 MED FILL — HYDROCHLOROTHIAZIDE 12.5 MG: 12.5 | 90 days supply | Qty: 90 | Fill #0

## 2016-03-18 MED FILL — AMLODIPINE BESYLATE 5 MG TA: 5 | 90 days supply | Qty: 90 | Fill #0

## 2016-04-13 DIAGNOSIS — H524 Presbyopia: Secondary | ICD-10-CM | POA: Diagnosis not present

## 2016-04-26 ENCOUNTER — Other Ambulatory Visit: Payer: Self-pay | Admitting: Physician Assistant

## 2016-04-26 DIAGNOSIS — Z1231 Encounter for screening mammogram for malignant neoplasm of breast: Secondary | ICD-10-CM

## 2016-05-03 DIAGNOSIS — Z713 Dietary counseling and surveillance: Secondary | ICD-10-CM | POA: Diagnosis not present

## 2016-05-03 DIAGNOSIS — M545 Low back pain: Secondary | ICD-10-CM | POA: Diagnosis not present

## 2016-05-03 DIAGNOSIS — I10 Essential (primary) hypertension: Secondary | ICD-10-CM | POA: Diagnosis not present

## 2016-05-03 DIAGNOSIS — E663 Overweight: Secondary | ICD-10-CM | POA: Diagnosis not present

## 2016-05-03 DIAGNOSIS — Z6830 Body mass index (BMI) 30.0-30.9, adult: Secondary | ICD-10-CM | POA: Diagnosis not present

## 2016-05-03 MED FILL — CYCLOBENZAPRINE 10 MG TAB: 10 | 30 days supply | Qty: 30 | Fill #0

## 2016-05-03 MED FILL — NAPROXEN 500 MG TABLET: 500 | 15 days supply | Qty: 30 | Fill #0

## 2016-05-26 ENCOUNTER — Ambulatory Visit
Admission: RE | Admit: 2016-05-26 | Discharge: 2016-05-26 | Disposition: A | Discharge status: Home / Self Care | Payer: 59 | Source: Ambulatory Visit | Attending: Physician Assistant | Admitting: Physician Assistant

## 2016-05-26 DIAGNOSIS — Z1231 Encounter for screening mammogram for malignant neoplasm of breast: Secondary | ICD-10-CM | POA: Diagnosis not present

## 2016-06-28 DIAGNOSIS — G43909 Migraine, unspecified, not intractable, without status migrainosus: Secondary | ICD-10-CM | POA: Diagnosis not present

## 2016-06-28 DIAGNOSIS — G479 Sleep disorder, unspecified: Secondary | ICD-10-CM | POA: Diagnosis not present

## 2016-06-28 DIAGNOSIS — L249 Irritant contact dermatitis, unspecified cause: Secondary | ICD-10-CM | POA: Diagnosis not present

## 2016-06-28 DIAGNOSIS — I10 Essential (primary) hypertension: Secondary | ICD-10-CM | POA: Diagnosis not present

## 2016-06-28 MED FILL — TRIAMCINOLONE 0.1% CREAM: 0.1 | 30 days supply | Qty: 80 | Fill #0

## 2016-06-28 MED FILL — HYDROCHLOROTHIAZIDE 12.5 MG: 12.5 | 90 days supply | Qty: 90 | Fill #0

## 2016-06-28 MED FILL — SUMATRIPTAN SUCC 100 MG TAB: 100 | 30 days supply | Qty: 18 | Fill #0

## 2016-07-09 MED FILL — ZOLPIDEM TARTRATE 5 MG TAB: 5 | 30 days supply | Qty: 30 | Fill #0

## 2016-07-26 DIAGNOSIS — G4700 Insomnia, unspecified: Secondary | ICD-10-CM | POA: Diagnosis not present

## 2016-07-26 DIAGNOSIS — G4719 Other hypersomnia: Secondary | ICD-10-CM | POA: Diagnosis not present

## 2016-07-26 DIAGNOSIS — R0681 Apnea, not elsewhere classified: Secondary | ICD-10-CM | POA: Diagnosis not present

## 2016-07-26 DIAGNOSIS — R0683 Snoring: Secondary | ICD-10-CM | POA: Diagnosis not present

## 2016-07-29 ENCOUNTER — Other Ambulatory Visit (HOSPITAL_BASED_OUTPATIENT_CLINIC_OR_DEPARTMENT_OTHER): Payer: Self-pay

## 2016-07-29 DIAGNOSIS — R5383 Other fatigue: Secondary | ICD-10-CM

## 2016-07-29 DIAGNOSIS — G4710 Hypersomnia, unspecified: Secondary | ICD-10-CM

## 2016-07-29 DIAGNOSIS — G4730 Sleep apnea, unspecified: Secondary | ICD-10-CM

## 2016-07-29 DIAGNOSIS — R0683 Snoring: Secondary | ICD-10-CM

## 2016-08-17 ENCOUNTER — Encounter (HOSPITAL_BASED_OUTPATIENT_CLINIC_OR_DEPARTMENT_OTHER): Payer: Self-pay

## 2016-09-14 ENCOUNTER — Ambulatory Visit (HOSPITAL_BASED_OUTPATIENT_CLINIC_OR_DEPARTMENT_OTHER): Payer: 59 | Attending: Family Medicine | Admitting: Internal Medicine

## 2016-09-14 DIAGNOSIS — G4710 Hypersomnia, unspecified: Secondary | ICD-10-CM | POA: Diagnosis not present

## 2016-09-14 DIAGNOSIS — R5383 Other fatigue: Secondary | ICD-10-CM | POA: Diagnosis not present

## 2016-09-14 DIAGNOSIS — G4730 Sleep apnea, unspecified: Secondary | ICD-10-CM

## 2016-09-14 DIAGNOSIS — R0683 Snoring: Secondary | ICD-10-CM | POA: Insufficient documentation

## 2016-09-18 DIAGNOSIS — R0683 Snoring: Secondary | ICD-10-CM

## 2016-09-18 NOTE — Procedures (Signed)
   Patient Name: Stacey Mendoza, Stacey Mendoza Date: 09/14/2016 Gender: Female D.O.B: November 06, 1965 Age (years): 50 Referring Provider: Carlos Levering Height (inches): 33 Interpreting Physician: Baird Lyons MD, ABSM Weight (lbs): 184 RPSGT: Jacolyn Reedy BMI: 32 MRN: [id number] Neck Size: 14.00 CLINICAL INFORMATION Sleep Study Type: HST  Indication for sleep study: Excessive Daytime Sleepiness, Fatigue, Snoring  Epworth Sleepiness Score: 8  SLEEP STUDY TECHNIQUE A multi-channel overnight portable sleep study was performed. The channels recorded were: nasal airflow, thoracic respiratory movement, and oxygen saturation with a pulse oximetry. Snoring was also monitored.  MEDICATIONS Patient self administered medications include: none reported.  SLEEP ARCHITECTURE Patient was studied for 295.0 minutes. The sleep efficiency was 99.2 % and the patient was supine for 99.8%. The arousal index was 0.0 per hour.  RESPIRATORY PARAMETERS The overall AHI was 0.4 per hour, with a central apnea index of 0.0 per hour.  The oxygen nadir was 88% during sleep.  CARDIAC DATA Mean heart rate during sleep was 76.1 bpm.  IMPRESSIONS - No significant obstructive sleep apnea occurred during this study (AHI = 0.4/h). - No significant central sleep apnea occurred during this study (CAI = 0.0/h). - Mild oxygen desaturation was noted during this study (Min O2 = 88%, Mean 95%). - Patient snored.  DIAGNOSIS - Normal study  RECOMMENDATIONS - Avoid alcohol, sedatives and other CNS depressants that may worsen sleep apnea and disrupt normal sleep architecture. - Sleep hygiene should be reviewed to assess factors that may improve sleep quality. - Weight management and regular exercise should be initiated or continued.  [Electronically signed] 09/18/2016 10:10 AM  Baird Lyons MD, ABSM Diplomate, American Board of Sleep Medicine   NPI: [id number]  [place], American Board of  Sleep Medicine  ELECTRONICALLY SIGNED ON:  09/18/2016, 10:08 AM [place] PH: [phone]   FX: [phone] [place]

## 2016-09-29 ENCOUNTER — Encounter: Payer: Self-pay | Admitting: Gynecology

## 2016-11-01 DIAGNOSIS — S40862A Insect bite (nonvenomous) of left upper arm, initial encounter: Secondary | ICD-10-CM | POA: Diagnosis not present

## 2016-11-01 DIAGNOSIS — L089 Local infection of the skin and subcutaneous tissue, unspecified: Secondary | ICD-10-CM | POA: Diagnosis not present

## 2016-11-01 MED FILL — DOXYCYCLINE HYCLATE 100 MG: 100 | 10 days supply | Qty: 20 | Fill #0

## 2016-11-12 MED FILL — CYCLOBENZAPRINE 10 MG TAB: 10 | 30 days supply | Qty: 30 | Fill #1

## 2016-11-12 MED FILL — HYDROCHLOROTHIAZIDE 12.5 MG: 12.5 | 90 days supply | Qty: 90 | Fill #1

## 2016-11-12 MED FILL — NAPROXEN 500 MG TABLET: 500 | 15 days supply | Qty: 30 | Fill #1

## 2017-01-24 DIAGNOSIS — N6310 Unspecified lump in the right breast, unspecified quadrant: Secondary | ICD-10-CM | POA: Diagnosis not present

## 2017-01-26 ENCOUNTER — Other Ambulatory Visit: Payer: Self-pay | Admitting: Family Medicine

## 2017-01-26 DIAGNOSIS — N63 Unspecified lump in unspecified breast: Secondary | ICD-10-CM

## 2017-01-28 ENCOUNTER — Other Ambulatory Visit: Payer: Self-pay | Admitting: Family Medicine

## 2017-01-28 DIAGNOSIS — N63 Unspecified lump in unspecified breast: Secondary | ICD-10-CM

## 2017-02-02 ENCOUNTER — Ambulatory Visit
Admission: RE | Admit: 2017-02-02 | Discharge: 2017-02-02 | Disposition: A | Discharge status: Home / Self Care | Payer: 59 | Source: Ambulatory Visit | Attending: Family Medicine | Admitting: Family Medicine

## 2017-02-02 DIAGNOSIS — N63 Unspecified lump in unspecified breast: Secondary | ICD-10-CM

## 2017-02-02 DIAGNOSIS — R928 Other abnormal and inconclusive findings on diagnostic imaging of breast: Secondary | ICD-10-CM | POA: Diagnosis not present

## 2017-02-02 DIAGNOSIS — N6312 Unspecified lump in the right breast, upper inner quadrant: Secondary | ICD-10-CM | POA: Diagnosis not present

## 2017-03-22 DIAGNOSIS — J069 Acute upper respiratory infection, unspecified: Secondary | ICD-10-CM | POA: Diagnosis not present

## 2017-03-24 MED FILL — HYDROCODONE-HOMATROPINE SYR: 5-1.5 | 9 days supply | Qty: 180 | Fill #0

## 2017-03-24 MED FILL — IPRATROPIUM 0.06% SPRAY: 0.06 | 21 days supply | Qty: 15 | Fill #0

## 2017-03-24 MED FILL — HYDROCHLOROTHIAZIDE 12.5 MG: 12.5 | 90 days supply | Qty: 90 | Fill #0

## 2017-04-27 ENCOUNTER — Other Ambulatory Visit: Payer: Self-pay | Admitting: Family Medicine

## 2017-04-27 DIAGNOSIS — Z1231 Encounter for screening mammogram for malignant neoplasm of breast: Secondary | ICD-10-CM

## 2017-05-02 DIAGNOSIS — H524 Presbyopia: Secondary | ICD-10-CM | POA: Diagnosis not present

## 2017-05-22 DIAGNOSIS — R531 Weakness: Secondary | ICD-10-CM | POA: Diagnosis not present

## 2017-05-22 DIAGNOSIS — R1011 Right upper quadrant pain: Secondary | ICD-10-CM | POA: Diagnosis not present

## 2017-05-22 DIAGNOSIS — R110 Nausea: Secondary | ICD-10-CM | POA: Diagnosis not present

## 2017-05-22 DIAGNOSIS — K828 Other specified diseases of gallbladder: Secondary | ICD-10-CM | POA: Diagnosis not present

## 2017-05-22 DIAGNOSIS — R404 Transient alteration of awareness: Secondary | ICD-10-CM | POA: Diagnosis not present

## 2017-05-23 DIAGNOSIS — R9431 Abnormal electrocardiogram [ECG] [EKG]: Secondary | ICD-10-CM | POA: Diagnosis not present

## 2017-05-23 MED FILL — HYDROCODON-APAP 5-325: 5-325 | 4 days supply | Qty: 12 | Fill #0

## 2017-05-25 DIAGNOSIS — I10 Essential (primary) hypertension: Secondary | ICD-10-CM | POA: Diagnosis not present

## 2017-05-25 DIAGNOSIS — R1901 Right upper quadrant abdominal swelling, mass and lump: Secondary | ICD-10-CM | POA: Diagnosis not present

## 2017-05-27 ENCOUNTER — Ambulatory Visit: Payer: Self-pay | Admitting: General Surgery

## 2017-05-27 ENCOUNTER — Ambulatory Visit
Admission: RE | Admit: 2017-05-27 | Discharge: 2017-05-27 | Disposition: A | Discharge status: Home / Self Care | Payer: 59 | Source: Ambulatory Visit | Attending: Family Medicine | Admitting: Family Medicine

## 2017-05-27 DIAGNOSIS — Z1231 Encounter for screening mammogram for malignant neoplasm of breast: Secondary | ICD-10-CM

## 2017-05-27 DIAGNOSIS — K8010 Calculus of gallbladder with chronic cholecystitis without obstruction: Secondary | ICD-10-CM | POA: Diagnosis not present

## 2017-05-27 MED FILL — ONDANSETRON ODT 4 MG TABLET: 4 | 5 days supply | Qty: 20 | Fill #0

## 2017-06-03 ENCOUNTER — Ambulatory Visit: Payer: 59

## 2017-06-30 NOTE — Patient Instructions (Addendum)
Stacey Mendoza  06/30/2017   Your procedure is scheduled on: 07-06-17   Report to [facility] Main Entrance Report to Admitting at 6:45 AM   Call this number if you have problems the morning of surgery [phone]   Remember: Do not eat food or drink liquids :After Midnight.   NO SOLID FOOD AFTER MIDNIGHT THE NIGHT PRIOR TO SURGERY. NOTHING BY MOUTH EXCEPT CLEAR LIQUIDS UNTIL 3 HOURS PRIOR TO SCHEDULED SURGERY. PLEASE FINISH ENSURE DRINK PER SURGEON ORDER 3 HOURS PRIOR TO SCHEDULED SURGERY TIME WHICH NEEDS TO BE COMPLETED AT5:45.  Take these medicines the morning of surgery with A SIP OF WATER: None                                You may not have any metal on your body including hair pins and              piercings  Do not wear jewelry, make-up, lotions, powders or perfumes, deodorant             Do not wear nail polish.  Do not shave  48 hours prior to surgery.                Do not bring valuables to the hospital. Stacey Mendoza.  Contacts, dentures or bridgework may not be worn into surgery.      Patients discharged the day of surgery will not be allowed to drive home.  Name and phone number of your driver: Stacey Mendoza [phone]     CLEAR LIQUID DIET   Foods Allowed                                                                     Foods Excluded  Coffee and tea, regular and decaf                             liquids that you cannot  Plain Jell-O in any flavor                                             see through such as: Fruit ices (not with fruit pulp)                                     milk, soups, orange juice  Iced Popsicles                                    All solid food Carbonated beverages, regular and diet  Cranberry, grape and apple juices Sports drinks like Gatorade Lightly seasoned clear broth or consume(fat free) Sugar, honey syrup  Sample  Menu Breakfast                                Lunch                                     Supper Cranberry juice                    Beef broth                            Chicken broth Jell-O                                     Grape juice                           Apple juice Coffee or tea                        Jell-O                                      Popsicle                                                Coffee or tea                        Coffee or tea  _____________________________________________________________________               Please read over the following fact sheets you were given: _____________________________________________________________________             [facility] - Preparing for Surgery Before surgery, you can play an important role.  Because skin is not sterile, your skin needs to be as free of germs as possible.  You can reduce the number of germs on your skin by washing with CHG (chlorahexidine gluconate) soap before surgery.  CHG is an antiseptic cleaner which kills germs and bonds with the skin to continue killing germs even after washing. Please DO NOT use if you have an allergy to CHG or antibacterial soaps.  If your skin becomes reddened/irritated stop using the CHG and inform your nurse when you arrive at Short Stay. Do not shave (including legs and underarms) for at least 48 hours prior to the first CHG shower.  You may shave your face/neck. Please follow these instructions carefully:  1.  Shower with CHG Soap the night before surgery and the  morning of Surgery.  2.  If you choose to wash your hair, wash your hair first as usual with your  normal  shampoo.  3.  After you shampoo, rinse your hair and body thoroughly to remove the  shampoo.  4.  Use CHG as you would any other liquid soap.  You can apply chg directly  to the skin and wash                       Gently with a scrungie or clean washcloth.  5.  Apply the CHG Soap to  your body ONLY FROM THE NECK DOWN.   Do not use on face/ open                           Wound or open sores. Avoid contact with eyes, ears mouth and genitals (private parts).                       Wash face,  Genitals (private parts) with your normal soap.             6.  Wash thoroughly, paying special attention to the area where your surgery  will be performed.  7.  Thoroughly rinse your body with warm water from the neck down.  8.  DO NOT shower/wash with your normal soap after using and rinsing off  the CHG Soap.                9.  Pat yourself dry with a clean towel.            10.  Wear clean pajamas.            11.  Place clean sheets on your bed the night of your first shower and do not  sleep with pets. Day of Surgery : Do not apply any lotions/deodorants the morning of surgery.  Please wear clean clothes to the hospital/surgery center.  FAILURE TO FOLLOW THESE INSTRUCTIONS MAY RESULT IN THE CANCELLATION OF YOUR SURGERY PATIENT SIGNATURE_________________________________  NURSE SIGNATURE__________________________________  ________________________________________________________________________

## 2017-07-01 ENCOUNTER — Encounter (HOSPITAL_COMMUNITY): Payer: Self-pay

## 2017-07-01 ENCOUNTER — Other Ambulatory Visit: Payer: Self-pay

## 2017-07-01 ENCOUNTER — Encounter (HOSPITAL_COMMUNITY)
Admission: RE | Admit: 2017-07-01 | Discharge: 2017-07-01 | Disposition: A | Discharge status: Home / Self Care | Payer: 59 | Source: Ambulatory Visit | Attending: General Surgery | Admitting: General Surgery

## 2017-07-01 DIAGNOSIS — Z01818 Encounter for other preprocedural examination: Secondary | ICD-10-CM | POA: Insufficient documentation

## 2017-07-01 DIAGNOSIS — K8050 Calculus of bile duct without cholangitis or cholecystitis without obstruction: Secondary | ICD-10-CM | POA: Insufficient documentation

## 2017-07-01 HISTORY — DX: Essential (primary) hypertension: I10

## 2017-07-01 LAB — BASIC METABOLIC PANEL
ANION GAP: 8 (ref 5–15)
BUN: 12 mg/dL (ref 6–20)
CALCIUM: 9.3 mg/dL (ref 8.9–10.3)
CO2: 23 mmol/L (ref 22–32)
Chloride: 107 mmol/L (ref 101–111)
Creatinine, Ser: 0.76 mg/dL (ref 0.44–1.00)
GFR calc Af Amer: >60 mL/min (ref >60)
GFR calc non Af Amer: >60 mL/min (ref >60)
GLUCOSE: 103 mg/dL — AB (ref 65–99)
Potassium: 4.2 mmol/L (ref 3.5–5.1)
Sodium: 138 mmol/L (ref 135–145)

## 2017-07-01 LAB — CBC
HEMATOCRIT: 33.3 % — AB (ref 36.0–46.0)
HEMOGLOBIN: 11 g/dL — AB (ref 12.0–15.0)
MCH: 22.9 pg — ABNORMAL LOW (ref 26.0–34.0)
MCHC: 33 g/dL (ref 30.0–36.0)
MCV: 69.4 fL — AB (ref 78.0–100.0)
Platelets: 151 10*3/uL (ref 150–400)
RBC: 4.8 MIL/uL (ref 3.87–5.11)
RDW: 15.7 % — AB (ref 11.5–15.5)
WBC: 4.7 10*3/uL (ref 4.0–10.5)

## 2017-07-01 LAB — HCG, SERUM, QUALITATIVE: Preg, Serum: NEGATIVE

## 2017-07-01 NOTE — Progress Notes (Addendum)
05-23-17 (Chart Everywhere) EKG   Discussed Ekg and Stress test results with Dr. Annye Asa. Pt can proceed with surgery.

## 2017-07-05 NOTE — Anesthesia Preprocedure Evaluation (Addendum)
Anesthesia Evaluation  Patient identified by MRN, date of birth, ID band Patient awake    Reviewed: Allergy & Precautions, NPO status , Patient's Chart, lab work & pertinent test results  Airway Mallampati: II  TM Distance: >3 FB Neck ROM: Full    Dental no notable dental hx.    Pulmonary neg pulmonary ROS,    Pulmonary exam normal        Cardiovascular Exercise Tolerance: Good hypertension,  Rhythm:Regular Rate:Normal  11-19-15 The estimated LV ejection fraction was 60%. Normal wall   motion; no LV regional wall motion abnormalities.   Neuro/Psych    GI/Hepatic negative GI ROS, Neg liver ROS,   Endo/Other    Renal/GU negative Renal ROS     Musculoskeletal   Abdominal   Peds  Hematology   Anesthesia Other Findings   Reproductive/Obstetrics                           Lab Results  Component Value Date   WBC 4.7 07/01/2017   HGB 11.0 (L) 07/01/2017   HCT 33.3 (L) 07/01/2017   MCV 69.4 (L) 07/01/2017   PLT 151 07/01/2017   Lab Results  Component Value Date   CREATININE 0.76 07/01/2017   BUN 12 07/01/2017   NA 138 07/01/2017   K 4.2 07/01/2017   CL 107 07/01/2017   CO2 23 07/01/2017     Anesthesia Physical Anesthesia Plan  ASA: II  Anesthesia Plan: General   Post-op Pain Management:    Induction: Intravenous  PONV Risk Score and Plan: 3 and Treatment may vary due to age or medical condition  Airway Management Planned: Oral ETT  Additional Equipment:   Intra-op Plan:   Post-operative Plan: Extubation in OR  Informed Consent: I have reviewed the patients History and Physical, chart, labs and discussed the procedure including the risks, benefits and alternatives for the proposed anesthesia with the patient or authorized representative who has indicated his/her understanding and acceptance.   Dental advisory given  Plan Discussed with: CRNA and  Anesthesiologist  Anesthesia Plan Comments:        Anesthesia Quick Evaluation

## 2017-07-06 ENCOUNTER — Ambulatory Visit (HOSPITAL_COMMUNITY)
Admission: RE | Admit: 2017-07-06 | Discharge: 2017-07-06 | Disposition: A | Discharge status: Home / Self Care | Payer: 59 | Source: Ambulatory Visit | Attending: General Surgery | Admitting: General Surgery

## 2017-07-06 ENCOUNTER — Ambulatory Visit (HOSPITAL_COMMUNITY): Payer: 59 | Admitting: Anesthesiology

## 2017-07-06 ENCOUNTER — Encounter (HOSPITAL_COMMUNITY): Payer: Self-pay

## 2017-07-06 ENCOUNTER — Encounter (HOSPITAL_COMMUNITY)
Admission: RE | Disposition: A | Discharge status: Home / Self Care | Payer: Self-pay | Source: Ambulatory Visit | Attending: General Surgery

## 2017-07-06 DIAGNOSIS — R109 Unspecified abdominal pain: Secondary | ICD-10-CM | POA: Diagnosis not present

## 2017-07-06 DIAGNOSIS — R1013 Epigastric pain: Secondary | ICD-10-CM | POA: Diagnosis not present

## 2017-07-06 DIAGNOSIS — K8050 Calculus of bile duct without cholangitis or cholecystitis without obstruction: Secondary | ICD-10-CM | POA: Diagnosis not present

## 2017-07-06 DIAGNOSIS — I10 Essential (primary) hypertension: Secondary | ICD-10-CM | POA: Insufficient documentation

## 2017-07-06 DIAGNOSIS — R112 Nausea with vomiting, unspecified: Secondary | ICD-10-CM | POA: Diagnosis not present

## 2017-07-06 DIAGNOSIS — K811 Chronic cholecystitis: Secondary | ICD-10-CM | POA: Diagnosis not present

## 2017-07-06 HISTORY — PX: CHOLECYSTECTOMY: SHX55

## 2017-07-06 SURGERY — LAPAROSCOPIC CHOLECYSTECTOMY
Anesthesia: General

## 2017-07-06 MED ORDER — PHENYLEPHRINE 40 MCG/ML (10ML) SYRINGE FOR IV PUSH (FOR BLOOD PRESSURE SUPPORT)
PREFILLED_SYRINGE | INTRAVENOUS | Status: DC | PRN
  Administered 2017-07-06: 80 ug via INTRAVENOUS

## 2017-07-06 MED ORDER — HYDROCODONE-ACETAMINOPHEN 7.5-325 MG PO TABS: 1.0000 | ORAL_TABLET | Freq: Once | ORAL | Status: DC | PRN

## 2017-07-06 MED ORDER — ROCURONIUM BROMIDE 10 MG/ML (PF) SYRINGE
PREFILLED_SYRINGE | INTRAVENOUS | Status: AC
  Filled 2017-07-06: qty 5

## 2017-07-06 MED ORDER — PROPOFOL 10 MG/ML IV BOLUS
INTRAVENOUS | Status: DC | PRN
  Administered 2017-07-06: 150 mg via INTRAVENOUS

## 2017-07-06 MED ORDER — LIDOCAINE 2% (20 MG/ML) 5 ML SYRINGE
INTRAMUSCULAR | Status: AC
  Filled 2017-07-06: qty 5

## 2017-07-06 MED ORDER — CEFOTETAN DISODIUM-DEXTROSE 2-2.08 GM-%(50ML) IV SOLR
2.0000 g | INTRAVENOUS | Status: AC
  Administered 2017-07-06: 2 g via INTRAVENOUS
  Filled 2017-07-06: qty 50

## 2017-07-06 MED ORDER — OXYCODONE HCL 5 MG PO TABS: 5.0000 mg | ORAL_TABLET | Freq: Four times a day (QID) | ORAL | 0 refills | Status: DC | PRN

## 2017-07-06 MED ORDER — SUGAMMADEX SODIUM 200 MG/2ML IV SOLN
INTRAVENOUS | Status: AC
  Filled 2017-07-06: qty 2

## 2017-07-06 MED ORDER — ONDANSETRON HCL 4 MG/2ML IJ SOLN
INTRAMUSCULAR | Status: AC
  Filled 2017-07-06: qty 2

## 2017-07-06 MED ORDER — MIDAZOLAM HCL 2 MG/2ML IJ SOLN
INTRAMUSCULAR | Status: AC
  Filled 2017-07-06: qty 2

## 2017-07-06 MED ORDER — BUPIVACAINE HCL (PF) 0.25 % IJ SOLN
INTRAMUSCULAR | Status: AC
  Filled 2017-07-06: qty 30

## 2017-07-06 MED ORDER — CHLORHEXIDINE GLUCONATE CLOTH 2 % EX PADS: 6.0000 | MEDICATED_PAD | Freq: Once | CUTANEOUS | Status: DC

## 2017-07-06 MED ORDER — KETOROLAC TROMETHAMINE 15 MG/ML IJ SOLN
INTRAMUSCULAR | Status: AC
  Filled 2017-07-06: qty 1

## 2017-07-06 MED ORDER — BUPIVACAINE-EPINEPHRINE 0.25% -1:200000 IJ SOLN
INTRAMUSCULAR | Status: DC | PRN
  Administered 2017-07-06: 50 mL

## 2017-07-06 MED ORDER — DEXAMETHASONE SODIUM PHOSPHATE 10 MG/ML IJ SOLN
INTRAMUSCULAR | Status: DC | PRN
  Administered 2017-07-06: 10 mg via INTRAVENOUS

## 2017-07-06 MED ORDER — EPHEDRINE SULFATE-NACL 50-0.9 MG/10ML-% IV SOSY
PREFILLED_SYRINGE | INTRAVENOUS | Status: DC | PRN
  Administered 2017-07-06 (×2): 5 mg via INTRAVENOUS

## 2017-07-06 MED ORDER — LIDOCAINE 2% (20 MG/ML) 5 ML SYRINGE
INTRAMUSCULAR | Status: DC | PRN
  Administered 2017-07-06: 50 mg via INTRAVENOUS

## 2017-07-06 MED ORDER — BUPIVACAINE-EPINEPHRINE 0.25% -1:200000 IJ SOLN
INTRAMUSCULAR | Status: AC
  Filled 2017-07-06: qty 1

## 2017-07-06 MED ORDER — PROMETHAZINE HCL 25 MG/ML IJ SOLN
INTRAMUSCULAR | Status: AC
  Filled 2017-07-06: qty 1

## 2017-07-06 MED ORDER — PHENYLEPHRINE 40 MCG/ML (10ML) SYRINGE FOR IV PUSH (FOR BLOOD PRESSURE SUPPORT)
PREFILLED_SYRINGE | INTRAVENOUS | Status: AC
  Filled 2017-07-06: qty 10

## 2017-07-06 MED ORDER — PROMETHAZINE HCL 25 MG/ML IJ SOLN
6.2500 mg | INTRAMUSCULAR | Status: DC | PRN
  Administered 2017-07-06: 6.25 mg via INTRAVENOUS

## 2017-07-06 MED ORDER — KETOROLAC TROMETHAMINE 15 MG/ML IJ SOLN
15.0000 mg | INTRAMUSCULAR | Status: DC
  Administered 2017-07-06: 15 mg via INTRAVENOUS
  Filled 2017-07-06: qty 1

## 2017-07-06 MED ORDER — LACTATED RINGERS IV SOLN
INTRAVENOUS | Status: DC
  Administered 2017-07-06: 1000 mL via INTRAVENOUS
  Administered 2017-07-06: 08:00:00 via INTRAVENOUS

## 2017-07-06 MED ORDER — ENSURE PRE-SURGERY PO LIQD
296.0000 mL | Freq: Once | ORAL | Status: AC
  Administered 2017-07-06: 296 mL via ORAL
  Filled 2017-07-06: qty 296

## 2017-07-06 MED ORDER — GABAPENTIN 300 MG PO CAPS
300.0000 mg | ORAL_CAPSULE | ORAL | Status: AC
  Administered 2017-07-06: 300 mg via ORAL
  Filled 2017-07-06: qty 1

## 2017-07-06 MED ORDER — EPHEDRINE 5 MG/ML INJ
INTRAVENOUS | Status: AC
  Filled 2017-07-06: qty 10

## 2017-07-06 MED ORDER — DEXAMETHASONE SODIUM PHOSPHATE 10 MG/ML IJ SOLN
INTRAMUSCULAR | Status: AC
  Filled 2017-07-06: qty 1

## 2017-07-06 MED ORDER — GABAPENTIN 300 MG PO CAPS: 300.0000 mg | ORAL_CAPSULE | Freq: Once | ORAL | Status: DC

## 2017-07-06 MED ORDER — FENTANYL CITRATE (PF) 100 MCG/2ML IJ SOLN
INTRAMUSCULAR | Status: AC
  Filled 2017-07-06: qty 2

## 2017-07-06 MED ORDER — KETOROLAC TROMETHAMINE 30 MG/ML IJ SOLN
INTRAMUSCULAR | Status: AC
  Filled 2017-07-06: qty 1

## 2017-07-06 MED ORDER — MEPERIDINE HCL 50 MG/ML IJ SOLN: 6.2500 mg | INTRAMUSCULAR | Status: DC | PRN

## 2017-07-06 MED ORDER — FENTANYL CITRATE (PF) 100 MCG/2ML IJ SOLN
INTRAMUSCULAR | Status: DC | PRN
  Administered 2017-07-06 (×2): 50 ug via INTRAVENOUS

## 2017-07-06 MED ORDER — ACETAMINOPHEN 500 MG PO TABS
1000.0000 mg | ORAL_TABLET | Freq: Once | ORAL | Status: AC
  Administered 2017-07-06: 1000 mg via ORAL
  Filled 2017-07-06: qty 2

## 2017-07-06 MED ORDER — CLONIDINE HCL 0.2 MG PO TABS
0.2000 mg | ORAL_TABLET | Freq: Once | ORAL | Status: AC
  Administered 2017-07-06: 0.2 mg via ORAL
  Filled 2017-07-06: qty 1

## 2017-07-06 MED ORDER — MIDAZOLAM HCL 5 MG/5ML IJ SOLN
INTRAMUSCULAR | Status: DC | PRN
  Administered 2017-07-06: 0.5 mg via INTRAVENOUS

## 2017-07-06 MED ORDER — ACETAMINOPHEN 10 MG/ML IV SOLN: 1000.0000 mg | Freq: Once | INTRAVENOUS | Status: DC | PRN

## 2017-07-06 MED ORDER — IBUPROFEN 800 MG PO TABS: 800.0000 mg | ORAL_TABLET | Freq: Three times a day (TID) | ORAL | 0 refills | Status: DC | PRN

## 2017-07-06 MED ORDER — PROPOFOL 10 MG/ML IV BOLUS
INTRAVENOUS | Status: AC
  Filled 2017-07-06: qty 20

## 2017-07-06 MED ORDER — ONDANSETRON HCL 4 MG/2ML IJ SOLN
INTRAMUSCULAR | Status: DC | PRN
  Administered 2017-07-06: 4 mg via INTRAVENOUS

## 2017-07-06 MED ORDER — ROCURONIUM BROMIDE 10 MG/ML (PF) SYRINGE
PREFILLED_SYRINGE | INTRAVENOUS | Status: DC | PRN
  Administered 2017-07-06: 40 mg via INTRAVENOUS

## 2017-07-06 MED ORDER — HYDROMORPHONE HCL 1 MG/ML IJ SOLN: 0.2500 mg | INTRAMUSCULAR | Status: DC | PRN

## 2017-07-06 MED ORDER — SUGAMMADEX SODIUM 200 MG/2ML IV SOLN
INTRAVENOUS | Status: DC | PRN
  Administered 2017-07-06: 200 mg via INTRAVENOUS

## 2017-07-06 MED ORDER — SCOPOLAMINE 1 MG/3DAYS TD PT72
1.0000 | MEDICATED_PATCH | TRANSDERMAL | Status: DC
  Administered 2017-07-06: 1.5 mg via TRANSDERMAL
  Filled 2017-07-06: qty 1

## 2017-07-06 MED ORDER — KETOROLAC TROMETHAMINE 15 MG/ML IJ SOLN
15.0000 mg | Freq: Once | INTRAMUSCULAR | Status: AC
  Administered 2017-07-06: 15 mg via INTRAVENOUS

## 2017-07-06 MED FILL — oxyCODONE HCL 5 MG TABS: 5 | 3 days supply | Qty: 20 | Fill #0

## 2017-07-06 MED FILL — IBUPROFEN 800 MG TABS: 800 | 10 days supply | Qty: 30 | Fill #0

## 2017-07-06 SURGICAL SUPPLY — 45 items
APL SKNCLS STERI-STRIP NONHPOA (GAUZE/BANDAGES/DRESSINGS) ×1
APPLIER CLIP ROT 10 11.4 M/L (STAPLE)
APR CLP MED LRG 11.4X10 (STAPLE)
BAG SPEC RTRVL 10 TROC 200 (ENDOMECHANICALS) ×1
BANDAGE ADH SHEER 1  50/CT (GAUZE/BANDAGES/DRESSINGS) ×10 IMPLANT
BENZOIN TINCTURE PRP APPL 2/3 (GAUZE/BANDAGES/DRESSINGS) ×2 IMPLANT
BNDG ADH 5X4 AIR PERM ELC (GAUZE/BANDAGES/DRESSINGS) ×4
BNDG COHESIVE 4X5 WHT NS (GAUZE/BANDAGES/DRESSINGS) ×4 IMPLANT
CABLE HIGH FREQUENCY MONO STRZ (ELECTRODE) ×2 IMPLANT
CATH CHOLANG 76X19 KUMAR (CATHETERS) IMPLANT
CHLORAPREP W/TINT 26ML (MISCELLANEOUS) ×2 IMPLANT
CLIP APPLIE ROT 10 11.4 M/L (STAPLE) IMPLANT
CLIP VESOLOCK LG 6/CT PURPLE (CLIP) ×1 IMPLANT
CLIP VESOLOCK XL 6/CT (CLIP) IMPLANT
COVER MAYO STAND STRL (DRAPES) IMPLANT
COVER SURGICAL LIGHT HANDLE (MISCELLANEOUS) ×2 IMPLANT
DECANTER SPIKE VIAL GLASS SM (MISCELLANEOUS) ×2 IMPLANT
DRAIN CHANNEL 19F RND (DRAIN) IMPLANT
DRAPE C-ARM 42X120 X-RAY (DRAPES) IMPLANT
EVACUATOR SILICONE 100CC (DRAIN) IMPLANT
GLOVE BIOGEL PI IND STRL 7.0 (GLOVE) ×1 IMPLANT
GLOVE BIOGEL PI INDICATOR 7.0 (GLOVE) ×1
GLOVE SURG SS PI 7.0 STRL IVOR (GLOVE) ×2 IMPLANT
GOWN STRL REUS W/TWL LRG LVL3 (GOWN DISPOSABLE) ×2 IMPLANT
GOWN STRL REUS W/TWL XL LVL3 (GOWN DISPOSABLE) ×4 IMPLANT
GRASPER SUT TROCAR 14GX15 (MISCELLANEOUS) ×2 IMPLANT
IRRIG SUCT STRYKERFLOW 2 WTIP (MISCELLANEOUS) ×2
IRRIGATION SUCT STRKRFLW 2 WTP (MISCELLANEOUS) ×1 IMPLANT
KIT BASIN OR (CUSTOM PROCEDURE TRAY) ×2 IMPLANT
POUCH RETRIEVAL ECOSAC 10 (ENDOMECHANICALS) ×1 IMPLANT
POUCH RETRIEVAL ECOSAC 10MM (ENDOMECHANICALS) ×1
SCISSORS LAP 5X35 DISP (ENDOMECHANICALS) ×2 IMPLANT
SHEARS HARMONIC ACE PLUS 36CM (ENDOMECHANICALS) IMPLANT
SLEEVE XCEL OPT CAN 5 100 (ENDOMECHANICALS) ×4 IMPLANT
STOPCOCK 4 WAY LG BORE MALE ST (IV SETS) ×2 IMPLANT
STRIP CLOSURE SKIN 1/2X4 (GAUZE/BANDAGES/DRESSINGS) ×2 IMPLANT
SUT ETHILON 2 0 PS N (SUTURE) IMPLANT
SUT MNCRL AB 4-0 PS2 18 (SUTURE) ×2 IMPLANT
SUT VICRYL 0 ENDOLOOP (SUTURE) IMPLANT
TOWEL OR 17X26 10 PK STRL BLUE (TOWEL DISPOSABLE) ×2 IMPLANT
TOWEL OR NON WOVEN STRL DISP B (DISPOSABLE) ×2 IMPLANT
TRAY LAPAROSCOPIC (CUSTOM PROCEDURE TRAY) ×2 IMPLANT
TROCAR BLADELESS OPT 5 100 (ENDOMECHANICALS) ×2 IMPLANT
TROCAR XCEL 12X100 BLDLESS (ENDOMECHANICALS) ×2 IMPLANT
TUBING INSUF HEATED (TUBING) ×2 IMPLANT

## 2017-07-06 NOTE — Anesthesia Postprocedure Evaluation (Signed)
Anesthesia Post Note  Patient: Stacey Mendoza  Procedure(s) Performed: LAPAROSCOPIC CHOLECYSTECTOMY (N/A )     Patient location during evaluation: PACU Anesthesia Type: General Level of consciousness: awake and alert Pain management: pain level controlled Vital Signs Assessment: post-procedure vital signs reviewed and stable Respiratory status: spontaneous breathing, nonlabored ventilation, respiratory function stable and patient connected to nasal cannula oxygen Cardiovascular status: blood pressure returned to baseline and stable Postop Assessment: no apparent nausea or vomiting Anesthetic complications: no    Last Vitals:  Vitals:   07/06/17 1135 07/06/17 1221  BP: 126/74 118/73  Pulse: 80 72  Resp: 16 18  Temp:  36.7 C  SpO2: 100% 100%    Last Pain:  Vitals:   07/06/17 1221  TempSrc: Oral  PainSc:                  Barnet [place]

## 2017-07-06 NOTE — Transfer of Care (Signed)
Immediate Anesthesia Transfer of Care Note  Patient: Stacey Mendoza  Procedure(s) Performed: LAPAROSCOPIC CHOLECYSTECTOMY (N/A )  Patient Location: PACU  Anesthesia Type:General  Level of Consciousness: awake, alert , oriented and patient cooperative  Airway & Oxygen Therapy: Patient Spontanous Breathing and Patient connected to face mask oxygen  Post-op Assessment: Report given to RN, Post -op Vital signs reviewed and stable and Patient moving all extremities  Post vital signs: Reviewed and stable  Last Vitals:  Vitals:   07/06/17 0651  BP: 135/81  Pulse: 81  Resp: 16  Temp: 37.7 C  SpO2: 100%    Last Pain:  Vitals:   07/06/17 0651  TempSrc: Oral         Complications: No apparent anesthesia complications

## 2017-07-06 NOTE — H&P (Signed)
Stacey Mendoza is an 52 y.o. female.   Chief Complaint: abdominal pain HPI: 52 yo female with abdominal pain and intermittent nausea. She has had persistent symptoms with several workups in the past.  Past Medical History:  Diagnosis Date  . Anemia   . Bulging disc    cervical MRI pending  . Elevated LFTs   . Hypertension   . Normal vaginal delivery 1992, 1986, 1990    Past Surgical History:  Procedure Laterality Date  . TUBAL LIGATION      Family History  Problem Relation Age of Onset  . Heart disease Father        HEART ATTACK  . Breast cancer Sister 33  . Cancer Sister        LIVER  . Hypertension Sister   . Hypertension Mother   . Diabetes Mother   . Heart disease Mother   . Throat cancer Mother   . Hypertension Brother   . Cancer Other 25       LIVER  . Hypertension Sister   . Hypertension Sister   . Colon cancer Neg Hx    Social History:  reports that  has never smoked. she has never used smokeless tobacco. She reports that she does not drink alcohol or use drugs.  Allergies:  Allergies  Allergen Reactions  . Vicodin [Hydrocodone-Acetaminophen]     HALLUCINATIONS    Medications Prior to Admission  Medication Sig Dispense Refill  . Biotin w/ Vitamins C & E (HAIR/SKIN/NAILS PO) Take 1 tablet by mouth every other day.    . hydrochlorothiazide (MICROZIDE) 12.5 MG capsule Take 12.5 mg by mouth daily.  1  . ondansetron (ZOFRAN-ODT) 4 MG disintegrating tablet Take 4 mg by mouth every 6 (six) hours as needed. For nausea.  0    No results found for this or any previous visit (from the past 48 hour(s)). No results found.  Review of Systems  Constitutional: Negative for chills and fever.  HENT: Negative for hearing loss.   Eyes: Negative for blurred vision and double vision.  Respiratory: Negative for cough and hemoptysis.   Cardiovascular: Negative for chest pain and palpitations.  Gastrointestinal: Negative for abdominal pain, nausea and vomiting.   Genitourinary: Negative for dysuria and urgency.  Musculoskeletal: Negative for myalgias and neck pain.  Skin: Negative for itching and rash.  Neurological: Negative for dizziness, tingling and headaches.  Endo/Heme/Allergies: Does not bruise/bleed easily.  Psychiatric/Behavioral: Negative for depression and suicidal ideas.    Blood pressure 135/81, pulse 81, temperature 99.8 F (37.7 C), temperature source Oral, resp. rate 16, height 5\' 4"  (1.626 m), weight 83.7 kg (184 lb 8 oz), SpO2 100 %. Physical Exam  Vitals reviewed. Constitutional: She is oriented to person, place, and time. She appears well-developed and well-nourished.  HENT:  Head: Normocephalic and atraumatic.  Eyes: Conjunctivae and EOM are normal. Pupils are equal, round, and reactive to light.  Neck: Normal range of motion. Neck supple.  Cardiovascular: Normal rate and regular rhythm.  Respiratory: Effort normal and breath sounds normal.  GI: Soft. Bowel sounds are normal. She exhibits no distension. There is tenderness.  Musculoskeletal: Normal range of motion.  Neurological: She is alert and oriented to person, place, and time.  Skin: Skin is warm and dry.  Psychiatric: She has a normal mood and affect. Her behavior is normal.     Assessment/Plan 52 yo female with biliary colic -lap chole -eras protocol -planned outpatient procedure  Mickeal Skinner, MD 07/06/2017, 8:12 AM

## 2017-07-06 NOTE — Anesthesia Procedure Notes (Signed)
Procedure Name: Intubation Date/Time: 07/06/2017 8:39 AM Performed by: Victoriano Lain, CRNA Pre-anesthesia Checklist: Patient identified, Emergency Drugs available, Suction available, Patient being monitored and Timeout performed Patient Re-evaluated:Patient Re-evaluated prior to induction Oxygen Delivery Method: Circle system utilized Preoxygenation: Pre-oxygenation with 100% oxygen Induction Type: IV induction Ventilation: Mask ventilation without difficulty Laryngoscope Size: Mac and 4 Grade View: Grade I Tube type: Oral Tube size: 7.5 mm Number of attempts: 1 Airway Equipment and Method: Stylet Placement Confirmation: ETT inserted through vocal cords under direct vision,  positive ETCO2 and breath sounds checked- equal and bilateral Secured at: 21 cm Tube secured with: Tape Dental Injury: Teeth and Oropharynx as per pre-operative assessment

## 2017-07-06 NOTE — Op Note (Signed)
PATIENT:  Stacey Mendoza  52 y.o. female  PRE-OPERATIVE DIAGNOSIS:  biliary colic  POST-OPERATIVE DIAGNOSIS:  biliary colic  PROCEDURE:  Procedure(s): LAPAROSCOPIC CHOLECYSTECTOMY   SURGEON:  Surgeon(s): Deunte Bledsoe, Arta Bruce, MD  ASSISTANT: none  ANESTHESIA:   local and general  Indications for procedure: Stacey Mendoza is a 52 y.o. female with symptoms of Abdominal pain and Nausea and vomiting consistent with gallbladder disease.  Description of procedure: The patient was brought into the operative suite, placed supine. Anesthesia was administered with endotracheal tube. Patient was strapped in place and foot board was secured. All pressure points were offloaded by foam padding. The patient was prepped and draped in the usual sterile fashion.  A small incision was made to the right of the umbilicus. A 64mm trocar was inserted into the peritoneal cavity with optical entry. Pneumoperitoneum was applied with high flow low pressure. 2 28mm trocars were placed in the RUQ. A 15mm trocar was placed in the subxiphoid space. All trocars sites were first anesthesized with 40 ml marcaine in the subcutaneous and preperitoneal layers. Next the patient was placed in reverse trendelenberg. The gallbladder was white in color.  The gallbladder was retracted cephalad and lateral. The peritoneum was reflected off the infundibulum working lateral to medial. The cystic duct and cystic artery were identified and further dissection revealed a critical view. The cystic duct and cystic artery were doubly clipped and ligated.   The gallbladder was removed off the liver bed with cautery. The Gallbladder was placed in a specimen bag. The gallbladder fossa was irrigated and hemostasis was applied with cautery. The gallbladder was removed via the 3mm trocar. The fascial defect was closed with interrupted 0 vicryl suture via laparoscopic trans-fascial suture passer. 58ml marcaine was infused into the area.  Pneumoperitoneum was removed, all trocar were removed. All incisions were closed with 4-0 monocryl subcuticular stitch. The patient woke from anesthesia and was brought to PACU in stable condition. All counts were correct  Findings: normal ductal anatomy  Specimen: gallbladder  Blood loss: Total I/O In: -  Out: 25 [Blood:25] ml  Local anesthesia: 50 ml marcaine  Complications: none  PLAN OF CARE: Discharge to home after PACU  PATIENT DISPOSITION:  PACU - hemodynamically stable.  Gurney Maxin, M.D. General, Bariatric, & Minimally Invasive Surgery [facility]

## 2017-07-07 ENCOUNTER — Encounter (HOSPITAL_COMMUNITY): Payer: Self-pay | Admitting: General Surgery

## 2017-07-21 MED FILL — HYDROCHLOROTHIAZIDE 12.5 MG: 12.5 | 90 days supply | Qty: 90 | Fill #0

## 2017-08-12 DIAGNOSIS — I10 Essential (primary) hypertension: Secondary | ICD-10-CM | POA: Diagnosis not present

## 2017-08-12 MED FILL — HYDROCHLOROTHIAZIDE 25 MG T: 25 | 30 days supply | Qty: 30 | Fill #0

## 2017-09-05 DIAGNOSIS — I10 Essential (primary) hypertension: Secondary | ICD-10-CM | POA: Diagnosis not present

## 2017-09-05 MED FILL — HYDROCHLOROTHIAZIDE 25 MG T: 25 | 90 days supply | Qty: 90 | Fill #0

## 2017-09-20 ENCOUNTER — Other Ambulatory Visit: Payer: Self-pay

## 2017-09-20 ENCOUNTER — Emergency Department (HOSPITAL_BASED_OUTPATIENT_CLINIC_OR_DEPARTMENT_OTHER): Payer: 59

## 2017-09-20 ENCOUNTER — Encounter (HOSPITAL_BASED_OUTPATIENT_CLINIC_OR_DEPARTMENT_OTHER): Payer: Self-pay | Admitting: *Deleted

## 2017-09-20 ENCOUNTER — Observation Stay (HOSPITAL_BASED_OUTPATIENT_CLINIC_OR_DEPARTMENT_OTHER)
Admission: EM | Admit: 2017-09-20 | Discharge: 2017-09-21 | Disposition: A | Discharge status: Home / Self Care | Payer: 59 | Attending: Internal Medicine | Admitting: Internal Medicine

## 2017-09-20 DIAGNOSIS — R202 Paresthesia of skin: Secondary | ICD-10-CM | POA: Diagnosis not present

## 2017-09-20 DIAGNOSIS — E876 Hypokalemia: Secondary | ICD-10-CM | POA: Insufficient documentation

## 2017-09-20 DIAGNOSIS — R0789 Other chest pain: Secondary | ICD-10-CM | POA: Diagnosis not present

## 2017-09-20 DIAGNOSIS — G459 Transient cerebral ischemic attack, unspecified: Principal | ICD-10-CM | POA: Insufficient documentation

## 2017-09-20 DIAGNOSIS — I10 Essential (primary) hypertension: Secondary | ICD-10-CM | POA: Diagnosis not present

## 2017-09-20 DIAGNOSIS — R079 Chest pain, unspecified: Secondary | ICD-10-CM | POA: Diagnosis not present

## 2017-09-20 DIAGNOSIS — R51 Headache: Secondary | ICD-10-CM | POA: Diagnosis not present

## 2017-09-20 DIAGNOSIS — Z79899 Other long term (current) drug therapy: Secondary | ICD-10-CM | POA: Diagnosis not present

## 2017-09-20 DIAGNOSIS — R200 Anesthesia of skin: Secondary | ICD-10-CM

## 2017-09-20 LAB — COMPREHENSIVE METABOLIC PANEL
ALBUMIN: 4.4 g/dL (ref 3.5–5.0)
ALK PHOS: 70 U/L (ref 38–126)
ALT: 31 U/L (ref 14–54)
AST: 22 U/L (ref 15–41)
Anion gap: 12 (ref 5–15)
BILIRUBIN TOTAL: 0.8 mg/dL (ref 0.3–1.2)
BUN: 12 mg/dL (ref 6–20)
CALCIUM: 9.9 mg/dL (ref 8.9–10.3)
CO2: 24 mmol/L (ref 22–32)
CREATININE: 0.83 mg/dL (ref 0.44–1.00)
Chloride: 100 mmol/L — ABNORMAL LOW (ref 101–111)
GFR calc Af Amer: >60 mL/min (ref >60)
GFR calc non Af Amer: >60 mL/min (ref >60)
GLUCOSE: 108 mg/dL — AB (ref 65–99)
Potassium: 2.9 mmol/L — ABNORMAL LOW (ref 3.5–5.1)
Sodium: 136 mmol/L (ref 135–145)
TOTAL PROTEIN: 7.8 g/dL (ref 6.5–8.1)

## 2017-09-20 LAB — CBC
HEMATOCRIT: 34.5 % — AB (ref 36.0–46.0)
Hemoglobin: 11.9 g/dL — ABNORMAL LOW (ref 12.0–15.0)
MCH: 23.5 pg — AB (ref 26.0–34.0)
MCHC: 34.5 g/dL (ref 30.0–36.0)
MCV: 68.2 fL — AB (ref 78.0–100.0)
PLATELETS: 223 10*3/uL (ref 150–400)
RBC: 5.06 MIL/uL (ref 3.87–5.11)
RDW: 16.2 % — ABNORMAL HIGH (ref 11.5–15.5)
WBC: 6.3 10*3/uL (ref 4.0–10.5)

## 2017-09-20 LAB — DIFFERENTIAL
Basophils Absolute: 0 10*3/uL (ref 0.0–0.1)
Basophils Relative: 0 %
EOS PCT: 1 %
Eosinophils Absolute: 0.1 10*3/uL (ref 0.0–0.7)
LYMPHS ABS: 2.6 10*3/uL (ref 0.7–4.0)
LYMPHS PCT: 41 %
Monocytes Absolute: 0.4 10*3/uL (ref 0.1–1.0)
Monocytes Relative: 6 %
NEUTROS ABS: 3.2 10*3/uL (ref 1.7–7.7)
Neutrophils Relative %: 52 %

## 2017-09-20 LAB — PROTIME-INR
INR: 0.98
PROTHROMBIN TIME: 12.9 s (ref 11.4–15.2)

## 2017-09-20 LAB — PREGNANCY, URINE: Preg Test, Ur: NEGATIVE

## 2017-09-20 LAB — TROPONIN I: Troponin I: <0.03 ng/mL (ref <0.03)

## 2017-09-20 LAB — APTT: aPTT: 26 seconds (ref 24–36)

## 2017-09-20 LAB — CBG MONITORING, ED: Glucose-Capillary: 108 mg/dL — ABNORMAL HIGH (ref 65–99)

## 2017-09-20 MED ORDER — NITROGLYCERIN 0.4 MG SL SUBL: 0.4000 mg | SUBLINGUAL_TABLET | SUBLINGUAL | Status: DC | PRN

## 2017-09-20 MED ORDER — ASPIRIN 81 MG PO CHEW
324.0000 mg | CHEWABLE_TABLET | Freq: Once | ORAL | Status: AC
  Administered 2017-09-20: 324 mg via ORAL
  Filled 2017-09-20: qty 4

## 2017-09-20 MED ORDER — POTASSIUM CHLORIDE 10 MEQ/100ML IV SOLN
10.0000 meq | Freq: Once | INTRAVENOUS | Status: AC
  Administered 2017-09-20: 10 meq via INTRAVENOUS
  Filled 2017-09-20: qty 100

## 2017-09-20 NOTE — ED Notes (Signed)
Contacted telespecialist for Dr. Leonette Monarch for the second time

## 2017-09-20 NOTE — ED Notes (Signed)
Pt sensory same on both sides whereas on initial neuro exam - sensation was different to left side

## 2017-09-20 NOTE — ED Notes (Signed)
Contacted Telespecialist Elberta Fortis) - for consult per Dr. Leonette Monarch

## 2017-09-20 NOTE — ED Notes (Signed)
Patient transported to X-ray 

## 2017-09-20 NOTE — ED Notes (Signed)
Pt reports feeling like she had to drag her left leg earlier, no weakness on assessment.

## 2017-09-20 NOTE — Consult Note (Signed)
[facility] TeleNeurology Consult Services  Asked to see this patient in telemedicine consultation. Consultation was performed with assistance of ancillary/medical staff at bedside.  Comments: Last Known normal  1900 Door Time: 2135 [facility] Contacted:  2301 [facility] first log in: 2307 NIHSS assessment time: 2318 Call back time: 2320 Needle Time: no iv tpa  HPI: 63 yof with hx of migraine and family hx of stroke presents with headache, chest pain and left sided numbness.  She felt numbness in her left arm and it progressed to heaviness in her left leg.  She denies any change in vision and states chest pain as resolved now.  She denies prior hx of heart attack and stroke.   CT scan head negative per radiology report.  VSS  Gen Wn/Wd in Nad  TeleStroke Assessment: LOC:   0 LOC questions:  0 LOC Commands :   0 Gaze : 0 Visual fields :  0  Facial movements : 0 Upper limb Motor  0 Lower limb Motor  0 Limb Coordination  - 0 Sensory -  - 0 Language -  0 Speech -   0 Neglect / extinction -  0  NIHSS Score: 0    IMPRESSION  TIA RO Stroke  Differential Diagnosis: 1- Cardioembolic stroke 2- Small vessel disease 3- Thrombotic artery to artery mechanism 4- Hypercoagulable state related 5- Thrombotic large vessel disease  Blood Pressure and Blood Glucose within acceptable parameters.   Medical Decision Making:   Patient is not candidate for alteplase due to non focal / localizing exam  Not an IR candidate as low clinical suspicion for LVO by neurologic assessment.   Recommendations: - Daily antithrombotics to initiate now if no contraindication.  - Further work up with Stroke labs, MRI brain, ECHO, NIVS Carotid will be deferred to inpt neurology service -  Needs Inpatient Neurology consultation and follow up  - Thank you for allowing [place] to participate in the care of your patient, if there are any questions please don't hesitate to contact us  Discussed plan of care with patient/family/hospital staff   Physician: Sylvan Cheese, DO   [facility]

## 2017-09-20 NOTE — ED Provider Notes (Signed)
[facility] Provider Note  CSN: [id number] Arrival date & time: 09/20/17 2135  Chief Complaint(s) Weakness  HPI Stacey Mendoza is a 52 y.o. female with a history of hypertension, hyperlipidemia, migraine headaches who presents to the emergency department with sudden onset of constant and fluctuating left-sided chest pressure that is nonexertional with no associated shortness of breath.  At the same time patient felt left arm numbness and tingling as well as left lower extremity tingling and heaviness.  Patient also had slight headache at that time.  Triage note states slurred speech and blurred vision but the patient is denied.  States that the tingling in the left lower extremity has subsided however it still feels heavy.  No alleviating or aggravating factors.  She denies any recent fevers or infections.  Denies any cough.  Endorses nausea without emesis.  No abdominal pain.  No urinary symptoms.  Reports strong family history of coronary artery disease and strokes.   Reports prior history of migraine headaches but this is atypical for her prior headaches.  HPI  Past Medical History Past Medical History:  Diagnosis Date  . Anemia   . Bulging disc    cervical MRI pending  . Elevated LFTs   . Hypertension   . Normal vaginal delivery 1992, 1986, 1990   Patient Active Problem List   Diagnosis Date Noted  . Abdominal pain, chronic, right upper quadrant 11/28/2013  . Family history of breast cancer in female 11/28/2013  . Nonspecific abnormal results of liver function study 06/29/2013  . Abdominal pain, right lower quadrant 06/29/2013  . Migraine headache 06/13/2013  . Dyspepsia and other specified disorders of function of stomach 04/10/2013  . Midepigastric pain 10/23/2012  . Unspecified constipation 10/23/2012   Home Medication(s) Prior to Admission medications   Medication Sig Start Date End Date Taking? Authorizing Provider  Biotin w/ Vitamins  C & E (HAIR/SKIN/NAILS PO) Take 1 tablet by mouth every other day.   Yes [provider]  hydrochlorothiazide (MICROZIDE) 12.5 MG capsule Take 12.5 mg by mouth daily. 03/24/17  Yes [provider]  ibuprofen (ADVIL,MOTRIN) 800 MG tablet Take 1 tablet (800 mg total) by mouth every 8 (eight) hours as needed. 07/06/17  Yes Kinsinger, Arta Bruce, MD  lactose free nutrition (BOOST) LIQD Take 237 mLs by mouth 3 (three) times daily between meals.   Yes [provider]  ondansetron (ZOFRAN-ODT) 4 MG disintegrating tablet Take 4 mg by mouth every 6 (six) hours as needed. For nausea. 05/27/17  Yes [provider]  oxyCODONE (OXY IR/ROXICODONE) 5 MG immediate release tablet Take 1-2 tablets (5-10 mg total) by mouth every 6 (six) hours as needed for severe pain. 07/06/17  Yes Kinsinger, Arta Bruce, MD  Past Surgical History Past Surgical History:  Procedure Laterality Date  . CHOLECYSTECTOMY N/A 07/06/2017   Procedure: LAPAROSCOPIC CHOLECYSTECTOMY;  Surgeon: Kinsinger, Arta Bruce, MD;  Location: [facility];  Service: General;  Laterality: N/A;  . TUBAL LIGATION     Family History Family History  Problem Relation Age of Onset  . Heart disease Father        HEART ATTACK  . Breast cancer Sister 50  . Cancer Sister        LIVER  . Hypertension Sister   . Hypertension Mother   . Diabetes Mother   . Heart disease Mother   . Throat cancer Mother   . Hypertension Brother   . Cancer Other 25       LIVER  . Hypertension Sister   . Hypertension Sister   . Colon cancer Neg Hx     Social History Social History   Tobacco Use  . Smoking status: Never Smoker  . Smokeless tobacco: Never Used  Substance Use Topics  . Alcohol use: No  . Drug use: No   Allergies Vicodin [hydrocodone-acetaminophen]  Review of Systems Review of Systems All other  systems are reviewed and are negative for acute change except as noted in the HPI  Physical Exam Vital Signs  I have reviewed the triage vital signs BP (!) 181/97 (BP Location: Right Arm)   Pulse (!) 106   Temp 98.6 F (37 C)   Resp 20   Ht 5' 4.25" (1.632 m)   Wt 83 kg (183 lb)   LMP 09/03/2017   SpO2 100%   BMI 31.17 kg/m   Physical Exam  Constitutional: She is oriented to person, place, and time. She appears well-developed and well-nourished. No distress.  HENT:  Head: Normocephalic and atraumatic.  Nose: Nose normal.  Eyes: Pupils are equal, round, and reactive to light. Conjunctivae and EOM are normal. Right eye exhibits no discharge. Left eye exhibits no discharge. No scleral icterus.  Neck: Normal range of motion. Neck supple.  Cardiovascular: Normal rate and regular rhythm. Exam reveals no gallop and no friction rub.  No murmur heard. Pulmonary/Chest: Effort normal and breath sounds normal. No stridor. No respiratory distress. She has no rales.  Abdominal: Soft. She exhibits no distension. There is no tenderness.  Musculoskeletal: She exhibits no edema or tenderness.  Neurological: She is alert and oriented to person, place, and time.  Mental Status:  Alert and oriented to person, place, and time.  Attention and concentration normal.  Speech clear.  Recent memory is intact  Cranial Nerves:  II Visual Fields: Intact to confrontation. Visual fields intact. III, IV, VI: Pupils equal and reactive to light and near. Full eye movement without nystagmus  V Facial Sensation: Normal. No weakness of masticatory muscles  VII: No facial weakness or asymmetry  VIII Auditory Acuity: Grossly normal  IX/X: The uvula is midline; the palate elevates symmetrically  XI: Normal sternocleidomastoid and trapezius strength  XII: The tongue is midline. No atrophy or fasciculations.   Motor System: Muscle Strength: 5/5 and symmetric in the upper and lower extremities. No pronation or  drift.  Muscle Tone: Tone and muscle bulk are normal in the upper and lower extremities.   Reflexes: DTRs: 2+ and symmetrical in all four extremities. No Clonus Coordination: Intact finger-to-nose, heel-to-shin. No tremor.  Sensation: Decreased sensation to left hemibody.  Gait: deferred   Skin: Skin is warm and dry. No rash noted. She is not diaphoretic. No erythema.  Psychiatric: She has  a normal mood and affect.  Vitals reviewed.   ED Results and Treatments Labs (all labs ordered are listed, but only abnormal results are displayed) Labs Reviewed  CBC - Abnormal; Notable for the following components:      Result Value   Hemoglobin 11.9 (*)    HCT 34.5 (*)    MCV 68.2 (*)    MCH 23.5 (*)    RDW 16.2 (*)    All other components within normal limits  COMPREHENSIVE METABOLIC PANEL - Abnormal; Notable for the following components:   Potassium 2.9 (*)    Chloride 100 (*)    Glucose, Bld 108 (*)    All other components within normal limits  CBG MONITORING, ED - Abnormal; Notable for the following components:   Glucose-Capillary 108 (*)    All other components within normal limits  PROTIME-INR  APTT  DIFFERENTIAL  TROPONIN I  PREGNANCY, URINE                                                                                                                         EKG  EKG Interpretation  Date/Time:  Tuesday Sep 20 2017 21:43:57 EDT Ventricular Rate:  105 PR Interval:  160 QRS Duration: 86 QT Interval:  328 QTC Calculation: 433 R Axis:   62 Text Interpretation:  Sinus tachycardia ST & T wave abnormality, consider inferior ischemia ST & T wave abnormality, consider anterolateral ischemia Abnormal ECG NO STEMI Otherwise no significant change Confirmed by Addison Lank [phone]) on 09/20/2017 10:39:58 PM      Radiology Ct Head Wo Contrast  Result Date: 09/20/2017 CLINICAL DATA:  Slight headache.  Slurred speech.  Blurred vision. EXAM: CT HEAD WITHOUT CONTRAST TECHNIQUE:  Contiguous axial images were obtained from the base of the skull through the vertex without intravenous contrast. COMPARISON:  None. FINDINGS: Brain: No evidence for acute infarction, hemorrhage, mass lesion, hydrocephalus, or extra-axial fluid. Normal for age cerebral volume. No white matter disease. Vascular: No hyperdense vessel or unexpected calcification. Skull: Normal. Negative for fracture or focal lesion. Sinuses/Orbits: No acute finding. Other: None. IMPRESSION: Normal exam.  No acute or focal intracranial abnormality. Electronically Signed   By: Staci Righter M.D.   On: 09/20/2017 22:21   Pertinent labs & imaging results that were available during my care of the patient were reviewed by me and considered in my medical decision making (see chart for details).  Medications Ordered in ED Medications  potassium chloride 10 mEq in 100 mL IVPB (has no administration in time range)  nitroGLYCERIN (NITROSTAT) SL tablet 0.4 mg (has no administration in time range)  aspirin chewable tablet 324 mg (has no administration in time range)  Procedures Procedures  (including critical care time)  Medical Decision Making / ED Course I have reviewed the nursing notes for this encounter and the patient's prior records (if available in EHR or on provided paperwork).    1. Chest pain. Atypical chest pain.  EKG without acute ischemic changes or evidence of pericarditis.  Initial troponin negative. HEART of 5. Will require admission for ACS rule out.  Low pretest probability for pulmonary embolism.  Presentation not classic for esophageal perforation.  Given the neurologic, there was suspicion for possible dissection however her presentation is atypical they are worse no objective neurologic findings on exam.  Chest x-ray without evidence suggestive of pneumonia, pneumothorax,  pneumomediastinum.  No abnormal contour of the mediastinum to suggest dissection. No evidence of acute injuries.   2.  Weakness. Patient has subjective sensation changes on exam, otherwise exam was nonfocal.  Tele-neurology consult was obtained who agreed.  They did not feel that the patient was a candidate for TPA given the lack of evidence concerning for LVO.  Possible TIA.  Screening labs were obtained and did reveal hypokalemia which could be contributing to her presentation however given her risk factors, patient will need admission for TIA work-up and ACS rule out.   Final Clinical Impression(s) / ED Diagnoses Final diagnoses:  Chest pain  Left sided numbness  Hypokalemia      This chart was dictated using voice recognition software.  Despite best efforts to proofread,  errors can occur which can change the documentation meaning.   Fatima Blank, MD 09/20/17 [phone]

## 2017-09-20 NOTE — ED Notes (Signed)
ED Provider at bedside. 

## 2017-09-20 NOTE — ED Notes (Signed)
Patient transported to CT 

## 2017-09-20 NOTE — ED Notes (Signed)
Pt passed swallow screen but reports she feels like something is stuck in her throat since 7pm when episode began

## 2017-09-20 NOTE — ED Triage Notes (Addendum)
7pm she had onset of slight headache, slurred speech, blurred vision. States her left arm and leg felt numb and tingly. Her left leg feels weak now but not tingling. Slight mouth droop that is hard to distinguish between true mouth droop and pts mannerisms.

## 2017-09-21 ENCOUNTER — Encounter (HOSPITAL_COMMUNITY): Payer: Self-pay | Admitting: Family Medicine

## 2017-09-21 ENCOUNTER — Observation Stay (HOSPITAL_BASED_OUTPATIENT_CLINIC_OR_DEPARTMENT_OTHER): Payer: 59

## 2017-09-21 ENCOUNTER — Observation Stay (HOSPITAL_COMMUNITY): Payer: 59

## 2017-09-21 DIAGNOSIS — R51 Headache: Secondary | ICD-10-CM | POA: Diagnosis not present

## 2017-09-21 DIAGNOSIS — E785 Hyperlipidemia, unspecified: Secondary | ICD-10-CM | POA: Diagnosis not present

## 2017-09-21 DIAGNOSIS — R079 Chest pain, unspecified: Secondary | ICD-10-CM | POA: Diagnosis present

## 2017-09-21 DIAGNOSIS — R200 Anesthesia of skin: Secondary | ICD-10-CM | POA: Diagnosis not present

## 2017-09-21 DIAGNOSIS — G459 Transient cerebral ischemic attack, unspecified: Secondary | ICD-10-CM | POA: Diagnosis not present

## 2017-09-21 DIAGNOSIS — F419 Anxiety disorder, unspecified: Secondary | ICD-10-CM | POA: Diagnosis not present

## 2017-09-21 DIAGNOSIS — Z79899 Other long term (current) drug therapy: Secondary | ICD-10-CM | POA: Diagnosis not present

## 2017-09-21 DIAGNOSIS — I10 Essential (primary) hypertension: Secondary | ICD-10-CM | POA: Diagnosis present

## 2017-09-21 DIAGNOSIS — E876 Hypokalemia: Secondary | ICD-10-CM | POA: Diagnosis not present

## 2017-09-21 DIAGNOSIS — R202 Paresthesia of skin: Secondary | ICD-10-CM | POA: Diagnosis not present

## 2017-09-21 HISTORY — DX: Hypokalemia: E87.6

## 2017-09-21 HISTORY — DX: Chest pain, unspecified: R07.9

## 2017-09-21 HISTORY — DX: Transient cerebral ischemic attack, unspecified: G45.9

## 2017-09-21 LAB — TROPONIN I

## 2017-09-21 LAB — BASIC METABOLIC PANEL
Anion gap: 10 (ref 5–15)
BUN: 8 mg/dL (ref 6–20)
CALCIUM: 9.3 mg/dL (ref 8.9–10.3)
CHLORIDE: 105 mmol/L (ref 101–111)
CO2: 25 mmol/L (ref 22–32)
CREATININE: 0.8 mg/dL (ref 0.44–1.00)
Glucose, Bld: 116 mg/dL — ABNORMAL HIGH (ref 65–99)
Potassium: 3.2 mmol/L — ABNORMAL LOW (ref 3.5–5.1)
SODIUM: 140 mmol/L (ref 135–145)

## 2017-09-21 LAB — RAPID URINE DRUG SCREEN, HOSP PERFORMED
Amphetamines: NOT DETECTED
Barbiturates: NOT DETECTED
Benzodiazepines: NOT DETECTED
Cocaine: NOT DETECTED
Opiates: NOT DETECTED
TETRAHYDROCANNABINOL: NOT DETECTED

## 2017-09-21 LAB — ECHOCARDIOGRAM COMPLETE
Height: 64 in
Weight: 2955.93 oz

## 2017-09-21 LAB — LIPID PANEL
CHOL/HDL RATIO: 3.3 ratio
CHOLESTEROL: 194 mg/dL (ref 0–200)
HDL: 59 mg/dL (ref >40)
LDL Cholesterol: 122 mg/dL — ABNORMAL HIGH (ref 0–99)
TRIGLYCERIDES: 64 mg/dL (ref <150)
VLDL: 13 mg/dL (ref 0–40)

## 2017-09-21 LAB — MAGNESIUM: MAGNESIUM: 2.6 mg/dL — AB (ref 1.7–2.4)

## 2017-09-21 LAB — HIV ANTIBODY (ROUTINE TESTING W REFLEX): HIV Screen 4th Generation wRfx: NONREACTIVE

## 2017-09-21 MED ORDER — POTASSIUM CHLORIDE IN NACL 20-0.9 MEQ/L-% IV SOLN
INTRAVENOUS | Status: AC
  Administered 2017-09-21: 04:00:00 via INTRAVENOUS
  Filled 2017-09-21: qty 1000

## 2017-09-21 MED ORDER — ACETAMINOPHEN 650 MG RE SUPP: 650.0000 mg | RECTAL | Status: DC | PRN

## 2017-09-21 MED ORDER — ASPIRIN 325 MG PO TABS
325.0000 mg | ORAL_TABLET | Freq: Every day | ORAL | Status: DC
  Administered 2017-09-21: 325 mg via ORAL
  Filled 2017-09-21: qty 1

## 2017-09-21 MED ORDER — POTASSIUM CHLORIDE CRYS ER 20 MEQ PO TBCR
40.0000 meq | EXTENDED_RELEASE_TABLET | Freq: Once | ORAL | Status: AC
  Administered 2017-09-21: 40 meq via ORAL
  Filled 2017-09-21: qty 2

## 2017-09-21 MED ORDER — SENNOSIDES-DOCUSATE SODIUM 8.6-50 MG PO TABS: 1.0000 | ORAL_TABLET | Freq: Every evening | ORAL | Status: DC | PRN

## 2017-09-21 MED ORDER — IOPAMIDOL (ISOVUE-370) INJECTION 76%
INTRAVENOUS | Status: AC
  Filled 2017-09-21: qty 50

## 2017-09-21 MED ORDER — LABETALOL HCL 5 MG/ML IV SOLN: 5.0000 mg | INTRAVENOUS | Status: DC | PRN

## 2017-09-21 MED ORDER — STROKE: EARLY STAGES OF RECOVERY BOOK
Freq: Once | Status: AC
  Administered 2017-09-21: 04:00:00

## 2017-09-21 MED ORDER — ATORVASTATIN CALCIUM 10 MG PO TABS
20.0000 mg | ORAL_TABLET | Freq: Every day | ORAL | Status: DC
  Administered 2017-09-21: 20 mg via ORAL
  Filled 2017-09-21: qty 2

## 2017-09-21 MED ORDER — LORAZEPAM 2 MG/ML IJ SOLN
1.0000 mg | Freq: Four times a day (QID) | INTRAMUSCULAR | Status: DC | PRN
  Administered 2017-09-21: 1 mg via INTRAVENOUS
  Filled 2017-09-21: qty 1

## 2017-09-21 MED ORDER — ASPIRIN 300 MG RE SUPP: 300.0000 mg | Freq: Every day | RECTAL | Status: DC

## 2017-09-21 MED ORDER — ACETAMINOPHEN 160 MG/5ML PO SOLN: 650.0000 mg | ORAL | Status: DC | PRN

## 2017-09-21 MED ORDER — ATORVASTATIN CALCIUM 20 MG PO TABS: 20.0000 mg | ORAL_TABLET | Freq: Every day | ORAL | 0 refills | Status: DC

## 2017-09-21 MED ORDER — IOPAMIDOL (ISOVUE-370) INJECTION 76%
50.0000 mL | Freq: Once | INTRAVENOUS | Status: AC | PRN
  Administered 2017-09-21: 50 mL via INTRAVENOUS

## 2017-09-21 MED ORDER — ENOXAPARIN SODIUM 40 MG/0.4ML ~~LOC~~ SOLN
40.0000 mg | SUBCUTANEOUS | Status: DC
  Administered 2017-09-21: 40 mg via SUBCUTANEOUS
  Filled 2017-09-21: qty 0.4

## 2017-09-21 MED ORDER — ASPIRIN 81 MG PO TBEC: 81.0000 mg | DELAYED_RELEASE_TABLET | Freq: Every day | ORAL | 0 refills | Status: AC

## 2017-09-21 MED ORDER — MAGNESIUM SULFATE IN D5W 1-5 GM/100ML-% IV SOLN
1.0000 g | Freq: Once | INTRAVENOUS | Status: AC
  Administered 2017-09-21: 1 g via INTRAVENOUS
  Filled 2017-09-21: qty 100

## 2017-09-21 MED ORDER — ASPIRIN EC 81 MG PO TBEC: 81.0000 mg | DELAYED_RELEASE_TABLET | Freq: Every day | ORAL | Status: DC

## 2017-09-21 MED ORDER — ACETAMINOPHEN 325 MG PO TABS: 650.0000 mg | ORAL_TABLET | ORAL | Status: DC | PRN

## 2017-09-21 MED ORDER — POTASSIUM CHLORIDE ER 20 MEQ PO TBCR: 20.0000 meq | EXTENDED_RELEASE_TABLET | Freq: Every day | ORAL | 0 refills | Status: DC

## 2017-09-21 MED ORDER — BOOST PO LIQD
237.0000 mL | Freq: Three times a day (TID) | ORAL | Status: DC
  Filled 2017-09-21 (×5): qty 237

## 2017-09-21 NOTE — Evaluation (Signed)
Physical Therapy Evaluation Patient Details Name: Stacey Mendoza MRN: [id number] DOB: 1965/06/22 Today's Date: 09/21/2017   History of Present Illness   52 y.o. female with medical history significant for hypertension, now presenting to the emergency department for evaluation of headache, left arm numbness, possible left leg weakness, and chest pain. Noncontrast head CT is a normal study. EKG features a sinus tachycardia with rate 105 and nonspecific ST-T abnormalities in diffuse leads.   Clinical Impression  PTA pt independent with mobility and iADLs, employed at [employer]. Pt currently limited in her safe mobility by decreased strength, sensation and mild instability. Pt is currently mod I for bed mobility and transfers and supervision for ambulation of a total of 400 feet. Pt with mild instability and dizziness with ambulation of 300 feet requiring seated rest break after 3 minutes able to ascend/descend 5 steps and return to room without additional instability. Pt does not require follow up PT however should have supervision for mobility especially if she needs to climb 33 steps to her apartment. PT will continue to follow acutely until d/c.     Follow Up Recommendations No PT follow up;Supervision for mobility/OOB    Equipment Recommendations  None recommended by PT    Recommendations for Other Services       Precautions / Restrictions Precautions Precautions: None Restrictions Weight Bearing Restrictions: No      Mobility  Bed Mobility Overal bed mobility: Modified Independent                Transfers Overall transfer level: Modified independent Equipment used: None                Ambulation/Gait Ambulation/Gait assistance: Supervision Ambulation Distance (Feet): 400 Feet Assistive device: None Gait Pattern/deviations: Step-through pattern;Decreased dorsiflexion - left Gait velocity: below pt baseline Gait velocity interpretation: >2.62 ft/sec, indicative  of community ambulatory General Gait Details: supervision for safety, as ambulation progressed pt with decreased L foot clearance and complaints of fatigue, pt became dizzy and experienced mild instability reaching out for UE support to steady herself, after seated rest break pt able to resume PT with stair training  Stairs Stairs: Yes Stairs assistance: Supervision Stair Management: One rail Left Number of Stairs: 5 General stair comments: strong steady ascent/descent of 5 steps after seated rest break  Wheelchair Mobility    Modified Rankin (Stroke Patients Only) Modified Rankin (Stroke Patients Only) Pre-Morbid Rankin Score: No symptoms Modified Rankin: Moderate disability     Balance Overall balance assessment: Needs assistance Sitting-balance support: No upper extremity supported;Feet supported Sitting balance-Leahy Scale: Good     Standing balance support: No upper extremity supported;During functional activity Standing balance-Leahy Scale: Fair Standing balance comment: decreased balance with 350 feet of ambulation                              Pertinent Vitals/Pain Pain Assessment: 0-10 Pain Score: 2  Pain Location: headache Pain Descriptors / Indicators: Headache Pain Intervention(s): Limited activity within patient's tolerance;Monitored during session    [place] expects to be discharged to:: Private residence Living Arrangements: Alone Available Help at Discharge: Family;Available 24 hours/day Type of Home: Apartment Home Access: Stairs to enter Entrance Stairs-Rails: Left Entrance Stairs-Number of Steps: 33 Home Layout: One level   Additional Comments: able to d/c to mother's house with 1 step to enter     Prior Function Level of Independence: Independent  Comments: driver, 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[employer]     Hand Dominance   Dominant Hand: Right    Extremity/Trunk Assessment   Upper Extremity Assessment Upper  Extremity Assessment: LUE deficits/detail LUE Deficits / Details: not formally assessed c/o of tiredness     Lower Extremity Assessment Lower Extremity Assessment: LLE deficits/detail LLE Deficits / Details: ROM WFL, strength grossly assessed at 4/5 (weaker than R LE) LLE Sensation: decreased light touch LLE Coordination: decreased gross motor    Cervical / Trunk Assessment Cervical / Trunk Assessment: Normal  Communication   Communication: No difficulties  Cognition Arousal/Alertness: Awake/alert Behavior During Therapy: WFL for tasks assessed/performed Overall Cognitive Status: Within Functional Limits for tasks assessed                                               Assessment/Plan    PT Assessment Patient needs continued PT services  PT Problem List Decreased activity tolerance;Decreased mobility;Decreased balance;Impaired sensation       PT Treatment Interventions Gait training;Stair training;Functional mobility training;Therapeutic activities;Therapeutic exercise;Balance training;Neuromuscular re-education;Patient/family education    PT Goals (Current goals can be found in the Care Plan section)  Acute Rehab PT Goals Patient Stated Goal: go home PT Goal Formulation: With patient Time For Goal Achievement: 10/05/17 Potential to Achieve Goals: Fair    Frequency Min 3X/week    AM-PAC PT "6 Clicks" Daily Activity  Outcome Measure Difficulty turning over in bed (including adjusting bedclothes, sheets and blankets)?: None Difficulty moving from lying on back to sitting on the side of the bed? : None Difficulty sitting down on and standing up from a chair with arms (e.g., wheelchair, bedside commode, etc,.)?: None Help needed moving to and from a bed to chair (including a wheelchair)?: None Help needed walking in hospital room?: A Little Help needed climbing 3-5 steps with a railing? : A Little 6 Click Score: 22    End of Session Equipment Utilized  During Treatment: Gait belt Activity Tolerance: Patient limited by fatigue Patient left: in bed;with call bell/phone within reach Nurse Communication: Mobility status PT Visit Diagnosis: Muscle weakness (generalized) (M62.81);Other abnormalities of gait and mobility (R26.89);Unsteadiness on feet (R26.81)    Time: 1024-1050 PT Time Calculation (min) (ACUTE ONLY): 26 min   Charges:   PT Evaluation $PT Eval Low Complexity: 1 Low PT Treatments $Gait Training: 8-22 mins   PT G Codes:        Ajee Heasley B. Migdalia Dk PT, DPT Acute Rehabilitation  [phone] Pager [phone]    [place] 09/21/2017, 12:23 PM

## 2017-09-21 NOTE — Progress Notes (Signed)
  Echocardiogram 2D Echocardiogram has been performed.  Stacey Mendoza 09/21/2017, 3:14 PM

## 2017-09-21 NOTE — ED Notes (Signed)
[facility] arrived to transport [facility] to [facility] 

## 2017-09-21 NOTE — Progress Notes (Signed)
Occupational Therapy Evaluation Patient Details Name: Stacey Mendoza MRN: [id number] DOB: Jan 04, 1966 Today's Date: 09/21/2017    History of Present Illness  52 y.o. female with medical history significant for hypertension, now presenting to the emergency department for evaluation of headache, left arm numbness, possible left leg weakness, and chest pain. Noncontrast head CT is a normal study. EKG features a sinus tachycardia with rate 105 and nonspecific ST-T abnormalities in diffuse leads.    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 Pt progressing well, however demonstrated difficulty with maintaining balance with challenging dynamic tasks. Will follow acutely to further assess ned for outpt OT to facilitate safe return to work.     Follow Up Recommendations  Supervision - Intermittent    Equipment Recommendations  None recommended by OT    Recommendations for Other Services       Precautions / Restrictions Precautions Precautions: None Restrictions Weight Bearing Restrictions: No      Mobility Bed Mobility Overal bed mobility: Modified Independent                Transfers Overall transfer level: Needs assistance Equipment used: None Transfers: Sit to/from Stand Sit to Stand: Supervision              Balance Overall balance assessment: Needs assistance Sitting-balance support: No upper extremity supported;Feet supported Sitting balance-Leahy Scale: Good     Standing balance support: No upper extremity supported;During functional activity Standing balance-Leahy Scale: Fair Standing balance comment: difficulty with balcne after beding forward to pick up item from floor to simulate work                           ADL either performed or assessed with clinical judgement   ADL Overall ADL's : Needs assistance/impaired Eating/Feeding: Independent   Grooming:  Supervision/safety;Standing   Upper Body Bathing: Set up;Sitting   Lower Body Bathing: Supervison/ safety;Sit to/from stand   Upper Body Dressing : Set up;Sitting   Lower Body Dressing: Supervision/safety;Sit to/from stand   Toilet Transfer: Supervision/safety;Ambulation   Toileting- Clothing Manipulation and Hygiene: Modified independent       Functional mobility during ADLs: Min guard General ADL Comments: When challenged with higher level balance tasks, pt becomes unsteady and states she does not feel well and requires A to steady herself     Vision Baseline Vision/History: Wears glasses Wears Glasses: Reading only Patient Visual Report: Blurring of vision Additional Comments: Vision appears normal and pt reports that her "blurry" vision is better     Perception Perception Comments: [facility]   Praxis Praxis Praxis tested?: Within functional limits    Pertinent Vitals/Pain Pain Assessment: No/denies pain     Hand Dominance Right   Extremity/Trunk Assessment Upper Extremity Assessment Upper Extremity Assessment: Overall WFL for tasks assessed LUE Deficits / Details: Pt using functionally wihtout difficulty; states no numbness   Lower Extremity Assessment Lower Extremity Assessment: Defer to PT evaluation LLE Deficits / Details: ROM WFL, strength grossly assessed at 4/5 (weaker than R LE) LLE Sensation: decreased light touch LLE Coordination: decreased gross motor   Cervical / Trunk Assessment Cervical / Trunk Assessment: Normal   Communication Communication Communication: No difficulties   Cognition Arousal/Alertness: Awake/alert Behavior During Therapy: WFL for tasks assessed/performed Overall Cognitive Status: Within Functional Limits for tasks assessed  General Comments       Exercises     Shoulder Instructions      Home Living Family/patient expects to be discharged to:: Private residence Living  Arrangements: Alone Available Help at Discharge: Family;Available 24 hours/day Type of Home: Apartment Home Access: Stairs to enter Entrance Stairs-Number of Steps: 33 Entrance Stairs-Rails: Left Home Layout: One level     Bathroom Shower/Tub: Corporate investment banker: Standard Bathroom Accessibility: Yes How Accessible: Accessible via walker Home Equipment: None   Additional Comments: able to d/c to mother's house with 1 step to enter       Prior Functioning/Environment Level of Independence: Independent        Comments: driver, 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[employer]        OT Problem List: Impaired balance (sitting and/or standing)      OT Treatment/Interventions: Self-care/ADL training;Therapeutic activities;Patient/family education;Balance training    OT Goals(Current goals can be found in the care plan section) Acute Rehab OT Goals Patient Stated Goal: go home OT Goal Formulation: With patient Time For Goal Achievement: 10/05/17 Potential to Achieve Goals: Good  OT Frequency: Min 2X/week   Barriers to D/C:            Co-evaluation              AM-PAC PT "6 Clicks" Daily Activity     Outcome Measure Help from another person eating meals?: None Help from another person taking care of personal grooming?: None Help from another person toileting, which includes using toliet, bedpan, or urinal?: A Little Help from another person bathing (including washing, rinsing, drying)?: None Help from another person to put on and taking off regular upper body clothing?: None Help from another person to put on and taking off regular lower body clothing?: A Little 6 Click Score: 22   End of Session Equipment Utilized During Treatment: Gait belt Nurse Communication: Mobility status  Activity Tolerance: Patient tolerated treatment well Patient left: in bed;with call bell/phone within reach;Other (comment)(going to procidure)  OT Visit Diagnosis: Unsteadiness on  feet (R26.81)                Time: 1311-1330 OT Time Calculation (min): 19 min Charges:  OT General Charges $OT Visit: 1 Visit OT Evaluation $OT Eval Low Complexity: 1 Low G-Codes:     [place], OT/L  [phone] 09/21/2017  [name],Mendoza 09/21/2017, 2:04 PM

## 2017-09-21 NOTE — Progress Notes (Signed)
D/c instructions and scripts given to patient. Patient verbalizes understanding. Will d/c via WC.

## 2017-09-21 NOTE — Progress Notes (Signed)
Patient is a 52 year old female with past medical history of hypertension who presented to the emergency department with complaints of headache, left arm numbness, left leg weakness and chest pain. Patient admitted for suspicion of acute stroke. Underwent MRI of the head which did not show any acute intracranial abnormalities.  Neurology is following. Patient evaluated by PT/OT and no specific recommendation provided.  She is undergoing echocardiogram and CTA of  the head and neck. Patient seen and examined the bedside this  afternoon.  Remains comfortable.  Complains of some heaviness on the left leg.Power 4+/5 on the left upper and lower extremity. Patient was seen by Dr. Myna Hidalgo this morning.

## 2017-09-21 NOTE — ED Notes (Signed)
Pt ambulatory to bathroom without difficulty.  

## 2017-09-21 NOTE — Progress Notes (Signed)
Received from [place] via stretcher, denies pain.  Oriented to room, plan of care & safety precautions.  Family at bedside.

## 2017-09-21 NOTE — Consult Note (Addendum)
Stroke Neurology Consultation Note  Consult Requested by: Dr. Tawanna Solo  Reason for Consult: chest pain with left arm numbness and left leg heaviness  Consult Date: 09/21/17  The history was obtained from the pt.  During history and examination, all items were able to obtain unless otherwise noted.  History of Present Illness:  Stacey Mendoza is a 52 y.o. African American female with PMH of HTN and migraine HA admitted for above complains.  She works in [employer] and yesterday after she off work, she had sudden onset chest pain, sharp at left side of the chest, and then she felt the pain went to her back of neck and then down to her left arm with left arm and hand numbness. She also felt some heaviness of left leg. She also has slight b/l frontal HA. She was sent to ER and tele stroke consulted. NIHSS = 0 and she was admitted for stroke work up. Currently, she denies any CP or numbness, but still fell heaviness at left arm and leg. She was able to walk with PT/OT but seems tired easily on the left.  She has hx of HTN and on BP meds at home, on admission BP 180/100 but now normalized. She had hx of migraine but was very infrequent. Usually stress and certain smell can trigger it but the HA episode is rare but sometimes she can get back to back HA episodes.   She denies smoking, alcohol or illicit drugs.  LSN: 7pm 09/20/17 tPA Given: No: NIHSS = 0  Past Medical History:  Diagnosis Date  . Anemia   . Bulging disc    cervical MRI pending  . Elevated LFTs   . Hypertension   . Normal vaginal delivery 1992, 1986, 1990    Past Surgical History:  Procedure Laterality Date  . CHOLECYSTECTOMY N/A 07/06/2017   Procedure: LAPAROSCOPIC CHOLECYSTECTOMY;  Surgeon: Kinsinger, Arta Bruce, MD;  Location: [facility];  Service: General;  Laterality: N/A;  . TUBAL LIGATION      Family History  Problem Relation Age of Onset  . Heart disease Father        HEART ATTACK  . Breast cancer Sister 62  . Cancer  Sister        LIVER  . Hypertension Sister   . Hypertension Mother   . Diabetes Mother   . Heart disease Mother   . Throat cancer Mother   . Hypertension Brother   . Cancer Other 25       LIVER  . Hypertension Sister   . Hypertension Sister   . Colon cancer Neg Hx     Social History:  reports that she has never smoked. She has never used smokeless tobacco. She reports that she does not drink alcohol or use drugs.  Allergies:  Allergies  Allergen Reactions  . Vicodin [Hydrocodone-Acetaminophen]     HALLUCINATIONS    No current facility-administered medications on file prior to encounter.    Current Outpatient Medications on File Prior to Encounter  Medication Sig Dispense Refill  . hydrochlorothiazide (MICROZIDE) 12.5 MG capsule Take 12.5 mg by mouth daily.  1  . lactose free nutrition (BOOST) LIQD Take 237 mLs by mouth 3 (three) times daily between meals.      Review of Systems: A full ROS was attempted today and was able to be performed.  Systems assessed include - Constitutional, Eyes,  HENT, Respiratory, Cardiovascular, Gastrointestinal, Genitourinary, Integument/breast, Hematologic/lymphatic, Musculoskeletal, Neurological, Behavioral/Psych, Endocrine,  Allergic/Immunologic - with pertinent responses as per  HPI.  Physical Examination: Temp:  [97.8 F (36.6 C)-99.2 F (37.3 C)] 97.9 F (36.6 C) (05/08 1526) Pulse Rate:  [74-106] 83 (05/08 1526) Resp:  [15-20] 16 (05/08 1526) BP: (126-181)/(81-104) 126/81 (05/08 1526) SpO2:  [100 %] 100 % (05/08 1526) Weight:  [183 lb (83 kg)-184 lb 11.9 oz (83.8 kg)] 184 lb 11.9 oz (83.8 kg) (05/08 0220)  General - well nourished, well developed, in no apparent distress.    Ophthalmologic - fundi not visualized due to noncooperation.    Cardiovascular - regular rate and rhythm  Mental Status -  Level of arousal and orientation to time, place, and person were intact. Language including expression, naming, repetition,  comprehension, reading, and writing was assessed and found intact. Fund of Knowledge was assessed and was intact.  Cranial Nerves II - XII - II - Vision intact OU. III, IV, VI - Extraocular movements intact. V - Facial sensation intact bilaterally. VII - Facial movement intact bilaterally. VIII - Hearing & vestibular intact bilaterally. X - Palate elevates symmetrically. XI - Chin turning & shoulder shrug intact bilaterally. XII - Tongue protrusion intact.  Motor Strength - The patient's strength was normal in all extremities and pronator drift was absent.   Motor Tone & Bulk - Muscle tone was assessed at the neck and appendages and was normal.  Bulk was normal and fasciculations were absent.   Reflexes - The patient's reflexes were normal in all extremities and she had no pathological reflexes.  Sensory - Light touch, temperature/pinprick were assessed and were normal.    Coordination - The patient had normal movements in the hands and feet with no ataxia or dysmetria.  Tremor was absent.  Gait and Station - deferred  Data Reviewed: Ct Angio Head W Or Wo Contrast  Result Date: 09/21/2017 CLINICAL DATA:  52 y/o F; chest pain radiating to the left. Tingling in the left hand. Left leg is heavy and dragging. EXAM: CT ANGIOGRAPHY HEAD AND NECK TECHNIQUE: Multidetector CT imaging of the head and neck was performed using the standard protocol during bolus administration of intravenous contrast. Multiplanar CT image reconstructions and MIPs were obtained to evaluate the vascular anatomy. Carotid stenosis measurements (when applicable) are obtained utilizing NASCET criteria, using the distal internal carotid diameter as the denominator. CONTRAST:  33mL ISOVUE-370 IOPAMIDOL (ISOVUE-370) INJECTION 76% COMPARISON:  09/20/2017 CT head.  09/21/2017 MRI head. FINDINGS: CTA NECK FINDINGS Aortic arch: Bovine variant branching. Imaged portion shows no evidence of aneurysm or dissection. No significant  stenosis of the major arch vessel origins. Right carotid system: No evidence of dissection, stenosis (50% or greater) or occlusion. Left carotid system: No evidence of dissection, stenosis (50% or greater) or occlusion. Vertebral arteries: Codominant. No evidence of dissection, stenosis (50% or greater) or occlusion. Skeleton: Mild cervical spondylosis. No high-grade bony canal stenosis. Other neck: Negative. Upper chest: Negative. Review of the MIP images confirms the above findings CTA HEAD FINDINGS Anterior circulation: No significant stenosis, proximal occlusion, aneurysm, or vascular malformation. Posterior circulation: No significant stenosis, proximal occlusion, aneurysm, or vascular malformation. Venous sinuses: As permitted by contrast timing, patent. Anatomic variants: Fetal right PCA. Patent anterior communicating artery. Delayed phase: No abnormal intracranial enhancement. Review of the MIP images confirms the above findings IMPRESSION: 1. Patent carotid and vertebral arteries. No dissection, aneurysm, or hemodynamically significant stenosis utilizing NASCET criteria. 2. Patent anterior and posterior intracranial circulation. No large vessel occlusion, aneurysm, or significant stenosis. Electronically Signed   By: Kristine Garbe M.D.   On:  09/21/2017 14:27   Dg Chest 2 View  Result Date: 09/20/2017 CLINICAL DATA:  52 year old female chest pain. EXAM: CHEST - 2 VIEW COMPARISON:  None. FINDINGS: The heart size and mediastinal contours are within normal limits. Both lungs are clear. The visualized skeletal structures are unremarkable. IMPRESSION: No active cardiopulmonary disease. Electronically Signed   By: Anner Crete M.D.   On: 09/20/2017 23:53   Ct Head Wo Contrast  Result Date: 09/20/2017 CLINICAL DATA:  Slight headache.  Slurred speech.  Blurred vision. EXAM: CT HEAD WITHOUT CONTRAST TECHNIQUE: Contiguous axial images were obtained from the base of the skull through the vertex  without intravenous contrast. COMPARISON:  None. FINDINGS: Brain: No evidence for acute infarction, hemorrhage, mass lesion, hydrocephalus, or extra-axial fluid. Normal for age cerebral volume. No white matter disease. Vascular: No hyperdense vessel or unexpected calcification. Skull: Normal. Negative for fracture or focal lesion. Sinuses/Orbits: No acute finding. Other: None. IMPRESSION: Normal exam.  No acute or focal intracranial abnormality. Electronically Signed   By: Staci Righter M.D.   On: 09/20/2017 22:21   Ct Angio Neck W Or Wo Contrast  Result Date: 09/21/2017 CLINICAL DATA:  52 y/o F; chest pain radiating to the left. Tingling in the left hand. Left leg is heavy and dragging. EXAM: CT ANGIOGRAPHY HEAD AND NECK TECHNIQUE: Multidetector CT imaging of the head and neck was performed using the standard protocol during bolus administration of intravenous contrast. Multiplanar CT image reconstructions and MIPs were obtained to evaluate the vascular anatomy. Carotid stenosis measurements (when applicable) are obtained utilizing NASCET criteria, using the distal internal carotid diameter as the denominator. CONTRAST:  57mL ISOVUE-370 IOPAMIDOL (ISOVUE-370) INJECTION 76% COMPARISON:  09/20/2017 CT head.  09/21/2017 MRI head. FINDINGS: CTA NECK FINDINGS Aortic arch: Bovine variant branching. Imaged portion shows no evidence of aneurysm or dissection. No significant stenosis of the major arch vessel origins. Right carotid system: No evidence of dissection, stenosis (50% or greater) or occlusion. Left carotid system: No evidence of dissection, stenosis (50% or greater) or occlusion. Vertebral arteries: Codominant. No evidence of dissection, stenosis (50% or greater) or occlusion. Skeleton: Mild cervical spondylosis. No high-grade bony canal stenosis. Other neck: Negative. Upper chest: Negative. Review of the MIP images confirms the above findings CTA HEAD FINDINGS Anterior circulation: No significant stenosis,  proximal occlusion, aneurysm, or vascular malformation. Posterior circulation: No significant stenosis, proximal occlusion, aneurysm, or vascular malformation. Venous sinuses: As permitted by contrast timing, patent. Anatomic variants: Fetal right PCA. Patent anterior communicating artery. Delayed phase: No abnormal intracranial enhancement. Review of the MIP images confirms the above findings IMPRESSION: 1. Patent carotid and vertebral arteries. No dissection, aneurysm, or hemodynamically significant stenosis utilizing NASCET criteria. 2. Patent anterior and posterior intracranial circulation. No large vessel occlusion, aneurysm, or significant stenosis. Electronically Signed   By: Kristine Garbe M.D.   On: 09/21/2017 14:27   Mr Brain Wo Contrast  Result Date: 09/21/2017 CLINICAL DATA:  52 y/o F; left-sided numbness, chest pain, headache. EXAM: MRI HEAD WITHOUT CONTRAST TECHNIQUE: Multiplanar, multiecho pulse sequences of the brain and surrounding structures were obtained without intravenous contrast. COMPARISON:  09/20/2017 CT head FINDINGS: Brain: No acute infarction, hemorrhage, hydrocephalus, extra-axial collection or mass lesion. Vascular: Normal flow voids. Skull and upper cervical spine: Normal marrow signal. Sinuses/Orbits: Negative. Other: None. IMPRESSION: No acute intracranial abnormality identified. Unremarkable MRI of the brain. Electronically Signed   By: Kristine Garbe M.D.   On: 09/21/2017 13:34    TTE - LVEF 60-65%, normal wall thickness, normal  wall motion, normal   diastolic function, trivial MR, normal LA size, trivial TR, RVSP   23 mmHg, normal IVC.   Assessment: 52 y.o. female PMH of HTN and migraine HA had sudden onset chest pain, radiating to the back of neck with left arm and hand numbness and heaviness of left leg. She also has slight b/l frontal HA. She does have hx of migraine but was very infrequent. Usually stress and certain smell can trigger it.  Currently neuro exam unremarkable also still feel left sided heaviness. Doubt for stroke or TIA but more likely related to CP and anxiety.   Stroke Risk Factors - hypertension   Plan:  Left sided heaviness - likely related to CP and anxiety  MRI  No acute stroke  CTA head and neck - unremarkable  2D Echo EF 60-65%  LDL 122  HgbA1c pending  UDS negative  lovenox for VTE prophylaxis  No antithrombotic prior to admission, now on aspirin 81 mg daily.   Patient counseled to be compliant with her antithrombotic medications  Ongoing aggressive stroke risk factor management  Therapy recommendations:  pending  Disposition:  pending  Hypertension Stable home HCTZ  Long term BP goal normotensive  Hyperlipidemia  Home meds:  none   LDL 122, goal < 70  Now on lipitor 20  Continue statin at discharge  Other Stroke Risk Factors  Obesity, Body mass index is 31.71 kg/m.   Migraines - infrequent  Other Active Problems    Hospital day # 0  Neurology will sign off. Please call with questions. Pt will follow up with stroke clinic NP at [facility] in about 4 weeks. Thanks for the consult.   Rosalin Hawking, MD PhD Stroke Neurology 09/21/2017 5:05 PM    To contact Stroke Continuity provider, please refer to [email or web address] After hours, contact General Neurology   Thank you for this consultation and allowing [place] to participate in the care of this patient.

## 2017-09-21 NOTE — H&P (Signed)
History and Physical    Stacey Mendoza DOB: [birth year] DOA: 09/20/2017  PCP: Aretta Nip, MD   Patient coming from: Home, by way of [facility]  Chief Complaint: Left-sided numbness, chest pain, headache   HPI: Stacey Mendoza is a 52 y.o. female with medical history significant for hypertension, now presenting to the emergency department for evaluation of headache, left arm numbness, possible left leg weakness, and chest pain.  Patient reports that she was in her usual state of health and was having an uneventful day until approximately 7 PM when she developed a slight headache with numbness and tingling in the left arm and possible left leg weakness.  She has history of headaches, but never experienced these other symptoms previously.  She denies any recent fall or trauma, denies shortness of breath or cough, and denies fevers or chills.  [facility] ED Course: Upon arrival to the ED, patient is found to be afebrile, saturating well on room air, hypertensive to 180/100, and vitals otherwise stable.  EKG features a sinus tachycardia with rate 105 and nonspecific ST-T abnormalities in diffuse leads.  Chest x-ray is negative for acute cardiopulmonary disease.  Noncontrast head CT is a normal study.  Chemistry panel is notable for potassium of 2.9.  CBC features a mild stable microcytic anemia with hemoglobin of 11.9.  Troponin is undetectable.  Patient was given 324 mg of aspirin and 10 mEq IV potassium.  Telemetry neurology evaluated the patient and recommended admission for MRI and formal neurology consultation.  Chest pain and left arm numbness have resolved prior to arrival at [facility].  Review of Systems:  All other systems reviewed and apart from HPI, are negative.  Past Medical History:  Diagnosis Date  . Anemia   . Bulging disc    cervical MRI pending  . Elevated LFTs   . Hypertension   . Normal vaginal delivery 1992, 1986, 1990    Past Surgical  History:  Procedure Laterality Date  . CHOLECYSTECTOMY N/A 07/06/2017   Procedure: LAPAROSCOPIC CHOLECYSTECTOMY;  Surgeon: Kinsinger, Arta Bruce, MD;  Location: [facility];  Service: General;  Laterality: N/A;  . TUBAL LIGATION       reports that she has never smoked. She has never used smokeless tobacco. She reports that she does not drink alcohol or use drugs.  Allergies  Allergen Reactions  . Vicodin [Hydrocodone-Acetaminophen]     HALLUCINATIONS    Family History  Problem Relation Age of Onset  . Heart disease Father        HEART ATTACK  . Breast cancer Sister 7  . Cancer Sister        LIVER  . Hypertension Sister   . Hypertension Mother   . Diabetes Mother   . Heart disease Mother   . Throat cancer Mother   . Hypertension Brother   . Cancer Other 25       LIVER  . Hypertension Sister   . Hypertension Sister   . Colon cancer Neg Hx      Prior to Admission medications   Medication Sig Start Date End Date Taking? Authorizing Provider  hydrochlorothiazide (MICROZIDE) 12.5 MG capsule Take 12.5 mg by mouth daily. 03/24/17  Yes [provider]  lactose free nutrition (BOOST) LIQD Take 237 mLs by mouth 3 (three) times daily between meals.   Yes [provider]    Physical Exam: Vitals:   09/20/17 2225 09/21/17 0000 09/21/17 0220 09/21/17 0222  BP:  Marland Kitchen)  145/94 (!) 171/97 (!) 155/94  Pulse:  89 85   Resp:  17 20   Temp: 98.6 F (37 C)  98 F (36.7 C)   TempSrc:   Oral   SpO2:  100% 100%   Weight:   83.8 kg (184 lb 11.9 oz)   Height:   5\' 4"  (1.626 m)       Constitutional: NAD, calm  Eyes: PERTLA, lids and conjunctivae normal ENMT: Mucous membranes are moist. Posterior pharynx clear of any exudate or lesions.   Neck: normal, supple, no masses, no thyromegaly Respiratory: clear to auscultation bilaterally, no wheezing, no crackles. Normal respiratory effort.    Cardiovascular: S1 & S2 heard, regular rate and rhythm. No extremity edema. No  significant JVD. Abdomen: No distension, no tenderness, soft. Bowel sounds normal.  Musculoskeletal: no clubbing / cyanosis. No joint deformity upper and lower extremities.    Skin: no significant rashes, lesions, ulcers. Warm, dry, well-perfused. Neurologic: CN 2-12 grossly intact. Sensation intact, patellar DTR normal. Strength 5/5 in all 4 limbs.  Psychiatric: Alert and oriented x 3. Calm, cooperative.     Labs on Admission: I have personally reviewed following labs and imaging studies  CBC: Recent Labs  Lab 09/20/17 2157  WBC 6.3  NEUTROABS 3.2  HGB 11.9*  HCT 34.5*  MCV 68.2*  PLT 081   Basic Metabolic Panel: Recent Labs  Lab 09/20/17 2157  NA 136  K 2.9*  CL 100*  CO2 24  GLUCOSE 108*  BUN 12  CREATININE 0.83  CALCIUM 9.9   GFR: Estimated Creatinine Clearance: 85.8 mL/min (by C-G formula based on SCr of 0.83 mg/dL). Liver Function Tests: Recent Labs  Lab 09/20/17 2157  AST 22  ALT 31  ALKPHOS 70  BILITOT 0.8  PROT 7.8  ALBUMIN 4.4   No results for input(s): LIPASE, AMYLASE in the last 168 hours. No results for input(s): AMMONIA in the last 168 hours. Coagulation Profile: Recent Labs  Lab 09/20/17 2157  INR 0.98   Cardiac Enzymes: Recent Labs  Lab 09/20/17 2158  TROPONINI <0.03   BNP (last 3 results) No results for input(s): PROBNP in the last 8760 hours. HbA1C: No results for input(s): HGBA1C in the last 72 hours. CBG: Recent Labs  Lab 09/20/17 2200  GLUCAP 108*   Lipid Profile: No results for input(s): CHOL, HDL, LDLCALC, TRIG, CHOLHDL, LDLDIRECT in the last 72 hours. Thyroid Function Tests: No results for input(s): TSH, T4TOTAL, FREET4, T3FREE, THYROIDAB in the last 72 hours. Anemia Panel: No results for input(s): VITAMINB12, FOLATE, FERRITIN, TIBC, IRON, RETICCTPCT in the last 72 hours. Urine analysis:    Component Value Date/Time   COLORURINE YELLOW 06/13/2013 1154   APPEARANCEUR CLEAR 06/13/2013 1154   LABSPEC 1.013  06/13/2013 1154   PHURINE 6.0 06/13/2013 1154   GLUCOSEU NEG 06/13/2013 1154   HGBUR NEG 06/13/2013 1154   BILIRUBINUR NEG 06/13/2013 1154   KETONESUR NEG 06/13/2013 1154   PROTEINUR NEG 06/13/2013 1154   UROBILINOGEN 0.2 06/13/2013 1154   NITRITE NEG 06/13/2013 1154   LEUKOCYTESUR TRACE (A) 06/13/2013 1154   Sepsis Labs: @LABRCNTIP (procalcitonin:4,lacticidven:4) )No results found for this or any previous visit (from the past 240 hour(s)).   Radiological Exams on Admission: Dg Chest 2 View  Result Date: 09/20/2017 CLINICAL DATA:  52 year old female chest pain. EXAM: CHEST - 2 VIEW COMPARISON:  None. FINDINGS: The heart size and mediastinal contours are within normal limits. Both lungs are clear. The visualized skeletal structures are unremarkable. IMPRESSION: No active  cardiopulmonary disease. Electronically Signed   By: Anner Crete M.D.   On: 09/20/2017 23:53   Ct Head Wo Contrast  Result Date: 09/20/2017 CLINICAL DATA:  Slight headache.  Slurred speech.  Blurred vision. EXAM: CT HEAD WITHOUT CONTRAST TECHNIQUE: Contiguous axial images were obtained from the base of the skull through the vertex without intravenous contrast. COMPARISON:  None. FINDINGS: Brain: No evidence for acute infarction, hemorrhage, mass lesion, hydrocephalus, or extra-axial fluid. Normal for age cerebral volume. No white matter disease. Vascular: No hyperdense vessel or unexpected calcification. Skull: Normal. Negative for fracture or focal lesion. Sinuses/Orbits: No acute finding. Other: None. IMPRESSION: Normal exam.  No acute or focal intracranial abnormality. Electronically Signed   By: Staci Righter M.D.   On: 09/20/2017 22:21    EKG: Independently reviewed. Sinus tachycardia (rate 105), ST-T abnormality in diffuse leads is similar but more pronounced than prior.   Assessment/Plan   1. Transient left-sided weakness  - Presents with slight headache, LUE numbness and tingling, and possible transient LLE  weakness - Symptoms resolved prior to arrival at [facility]  - Head CT is a normal study  - Discussed with neurology at [facility], appreciate consultation  - Continue cardiac monitoring, frequent neuro checks, PT/OT evals, already passed swallow screen  - Check MRI brain, MRA head, carotid doppler, echo, fasting lipids, and A1c; continue ASA    2. Chest pain  - Resolved by time of admission  - No known hx of CAD, but RF's include HTN and FHx of premature CAD  - EKG features non-specific ST-T abnormalities that are more pronounced than on prior  - Initial troponin undetectable and CXR unremarkable  - Treated in ED with ASA 324 mg  - Continue cardiac monitoring, obtain serial troponin measurements, continue ASA, follow-up echo   3. Hypertension  - BP 180/100 in ED  - Managed with HCTZ at home, held on admission pending stroke workup    4. Hypokalemia  - Serum potassium is 2.9 on admission  - Possibly secondary to HCTZ  - Treated in ED with 10 mEq IV potassium  - Will give 40 mEq oral potassium, 1 g IV mag, add KCl to IVF, continue cardiac monitoring, and repeat chem panel in am    DVT prophylaxis: Lovenox Code Status: Full  Family Communication: Discussed with patient Consults called: Neurology Admission status: Observation    Vianne Bulls, MD [facility] Pager [phone]  If 7PM-7AM, please contact night-coverage [email or web address] Password [facility]  09/21/2017, 3:19 AM

## 2017-09-21 NOTE — Discharge Summary (Addendum)
Physician Discharge Summary  Stacey Mendoza PFX:[id number] DOB: Sep 30, 1965 DOA: 09/20/2017  PCP: Aretta Nip, MD  Admit date: 09/20/2017 Discharge date: 09/21/2017  Admitted From: Home Disposition:  Home  Discharge Condition:Stable CODE STATUS:FULL Diet recommendation: Heart Healthy   Brief/Interim Summary: Admission H and P: HPI: Stacey Mendoza is a 52 y.o. female with medical history significant for hypertension, now presenting to the emergency department for evaluation of headache, left arm numbness, possible left leg weakness, and chest pain.  Patient reports that she was in her usual state of health and was having an uneventful day until approximately 7 PM when she developed a slight headache with numbness and tingling in the left arm and possible left leg weakness.  She has history of headaches, but never experienced these other symptoms previously.  She denies any recent fall or trauma, denies shortness of breath or cough, and denies fevers or chills.  [facility] ED Course: Upon arrival to the ED, patient is found to be afebrile, saturating well on room air, hypertensive to 180/100, and vitals otherwise stable.  EKG features a sinus tachycardia with rate 105 and nonspecific ST-T abnormalities in diffuse leads.  Chest x-ray is negative for acute cardiopulmonary disease.  Noncontrast head CT is a normal study.  Chemistry panel is notable for potassium of 2.9.  CBC features a mild stable microcytic anemia with hemoglobin of 11.9.  Troponin is undetectable.  Patient was given 324 mg of aspirin and 10 mEq IV potassium.  Telemetry neurology evaluated the patient and recommended admission for MRI and formal neurology consultation.  Chest pain and left arm numbness have resolved prior to arrival at [facility].  Hospital course: Weakness and numbness on her left side improved.  She was evaluated by PT/OT and she does not need any follow-up. MRI of the brain did not show any  acute intracranial abnormalities.  Echocardiogram showed normal left ventricular ejection fraction,  No wall motion abnormalities.  CTA head and neck were unremarkable.  Neurology also evaluated the patient during the hospitalization.  Patient has been started on aspirin and Lipitor. Patient is stable for discharge to home today.  She will follow-up with neurology as an outpatient.  Following problems were addressed during hospitalization:  1. Transient left-sided weakness  - Presents with slight headache, LUE numbness and tingling, and possible transient LLE weakness - Symptoms resolved prior to arrival at [facility]  - Head CT and otther imagings normal  2. Chest pain  - Resolved by time of admission  - No known hx of CAD, but RF's include HTN and FHx of premature CAD  - EKG features non-specific ST-T abnormalities that are more pronounced than on prior  - Initial troponin undetectable and CXR unremarkable  -Echo unremarkable  3. Hypertension  - BP 180/100 in ED  - Continue hydrochlorothiazide at home.BP stable this afternoon.  4. Hypokalemia  - Serum potassium is 2.9 on admission  - Possibly secondary to HCTZ  - Supplemented    Discharge Diagnoses:  Principal Problem:   TIA (transient ischemic attack) Active Problems:   Chest pain   Hypertension   Hypokalemia    Discharge Instructions  Discharge Instructions    Ambulatory referral to Neurology   Complete by:  As directed    Follow up with stroke clinic NP (Jessica Vanschaick or Cecille Rubin, if both not available, consider Zachery Dauer, or Ahern) at [facility] in about 4 weeks. Thanks.   Diet - low sodium heart healthy  Complete by:  As directed    Discharge instructions   Complete by:  As directed    1) Follow up with your PCP in a week.Check potassium level in a week. 2) Follow up with neurology as an outpatient. 3) Take prescribed medications as instructed.   Increase activity slowly   Complete by:  As directed       Allergies as of 09/21/2017      Reactions   Vicodin [hydrocodone-acetaminophen]    HALLUCINATIONS      Medication List    TAKE these medications   aspirin 81 MG EC tablet Take 1 tablet (81 mg total) by mouth daily. Start taking on:  09/22/2017   atorvastatin 20 MG tablet Commonly known as:  LIPITOR Take 1 tablet (20 mg total) by mouth daily at 6 PM.   hydrochlorothiazide 12.5 MG capsule Commonly known as:  MICROZIDE Take 12.5 mg by mouth daily.   lactose free nutrition Liqd Take 237 mLs by mouth 3 (three) times daily between meals.   Potassium Chloride ER 20 MEQ Tbcr Take 20 mEq by mouth daily for 5 days. Start taking on:  09/22/2017      Follow-up Information    Rankins, Bill Salinas, MD. Schedule an appointment as soon as possible for a visit in 1 week(s).   Specialty:  Family Medicine Contact information: [place] [address] [facility]. Schedule an appointment as soon as possible for a visit in 4 week(s).   Specialty:  Radiology Contact information: [place] [phone]         Allergies  Allergen Reactions  . Vicodin [Hydrocodone-Acetaminophen]     HALLUCINATIONS    Consultations: neurology  Procedures/Studies: Ct Angio Head W Or Wo Contrast  Result Date: 09/21/2017 CLINICAL DATA:  52 y/o F; chest pain radiating to the left. Tingling in the left hand. Left leg is heavy and dragging. EXAM: CT ANGIOGRAPHY HEAD AND NECK TECHNIQUE: Multidetector CT imaging of the head and neck was performed using the standard protocol during bolus administration of intravenous contrast. Multiplanar CT image reconstructions and MIPs were obtained to evaluate the vascular anatomy. Carotid stenosis measurements (when applicable) are obtained utilizing NASCET criteria, using the distal internal carotid diameter as the denominator. CONTRAST:  24mL  ISOVUE-370 IOPAMIDOL (ISOVUE-370) INJECTION 76% COMPARISON:  09/20/2017 CT head.  09/21/2017 MRI head. FINDINGS: CTA NECK FINDINGS Aortic arch: Bovine variant branching. Imaged portion shows no evidence of aneurysm or dissection. No significant stenosis of the major arch vessel origins. Right carotid system: No evidence of dissection, stenosis (50% or greater) or occlusion. Left carotid system: No evidence of dissection, stenosis (50% or greater) or occlusion. Vertebral arteries: Codominant. No evidence of dissection, stenosis (50% or greater) or occlusion. Skeleton: Mild cervical spondylosis. No high-grade bony canal stenosis. Other neck: Negative. Upper chest: Negative. Review of the MIP images confirms the above findings CTA HEAD FINDINGS Anterior circulation: No significant stenosis, proximal occlusion, aneurysm, or vascular malformation. Posterior circulation: No significant stenosis, proximal occlusion, aneurysm, or vascular malformation. Venous sinuses: As permitted by contrast timing, patent. Anatomic variants: Fetal right PCA. Patent anterior communicating artery. Delayed phase: No abnormal intracranial enhancement. Review of the MIP images confirms the above findings IMPRESSION: 1. Patent carotid and vertebral arteries. No dissection, aneurysm, or hemodynamically significant stenosis utilizing NASCET criteria. 2. Patent anterior and posterior intracranial circulation. No large vessel occlusion, aneurysm, or significant stenosis.  Electronically Signed   By: Kristine Garbe M.D.   On: 09/21/2017 14:27   Dg Chest 2 View  Result Date: 09/20/2017 CLINICAL DATA:  52 year old female chest pain. EXAM: CHEST - 2 VIEW COMPARISON:  None. FINDINGS: The heart size and mediastinal contours are within normal limits. Both lungs are clear. The visualized skeletal structures are unremarkable. IMPRESSION: No active cardiopulmonary disease. Electronically Signed   By: Anner Crete M.D.   On: 09/20/2017 23:53    Ct Head Wo Contrast  Result Date: 09/20/2017 CLINICAL DATA:  Slight headache.  Slurred speech.  Blurred vision. EXAM: CT HEAD WITHOUT CONTRAST TECHNIQUE: Contiguous axial images were obtained from the base of the skull through the vertex without intravenous contrast. COMPARISON:  None. FINDINGS: Brain: No evidence for acute infarction, hemorrhage, mass lesion, hydrocephalus, or extra-axial fluid. Normal for age cerebral volume. No white matter disease. Vascular: No hyperdense vessel or unexpected calcification. Skull: Normal. Negative for fracture or focal lesion. Sinuses/Orbits: No acute finding. Other: None. IMPRESSION: Normal exam.  No acute or focal intracranial abnormality. Electronically Signed   By: Staci Righter M.D.   On: 09/20/2017 22:21   Ct Angio Neck W Or Wo Contrast  Result Date: 09/21/2017 CLINICAL DATA:  52 y/o F; chest pain radiating to the left. Tingling in the left hand. Left leg is heavy and dragging. EXAM: CT ANGIOGRAPHY HEAD AND NECK TECHNIQUE: Multidetector CT imaging of the head and neck was performed using the standard protocol during bolus administration of intravenous contrast. Multiplanar CT image reconstructions and MIPs were obtained to evaluate the vascular anatomy. Carotid stenosis measurements (when applicable) are obtained utilizing NASCET criteria, using the distal internal carotid diameter as the denominator. CONTRAST:  38mL ISOVUE-370 IOPAMIDOL (ISOVUE-370) INJECTION 76% COMPARISON:  09/20/2017 CT head.  09/21/2017 MRI head. FINDINGS: CTA NECK FINDINGS Aortic arch: Bovine variant branching. Imaged portion shows no evidence of aneurysm or dissection. No significant stenosis of the major arch vessel origins. Right carotid system: No evidence of dissection, stenosis (50% or greater) or occlusion. Left carotid system: No evidence of dissection, stenosis (50% or greater) or occlusion. Vertebral arteries: Codominant. No evidence of dissection, stenosis (50% or greater) or  occlusion. Skeleton: Mild cervical spondylosis. No high-grade bony canal stenosis. Other neck: Negative. Upper chest: Negative. Review of the MIP images confirms the above findings CTA HEAD FINDINGS Anterior circulation: No significant stenosis, proximal occlusion, aneurysm, or vascular malformation. Posterior circulation: No significant stenosis, proximal occlusion, aneurysm, or vascular malformation. Venous sinuses: As permitted by contrast timing, patent. Anatomic variants: Fetal right PCA. Patent anterior communicating artery. Delayed phase: No abnormal intracranial enhancement. Review of the MIP images confirms the above findings IMPRESSION: 1. Patent carotid and vertebral arteries. No dissection, aneurysm, or hemodynamically significant stenosis utilizing NASCET criteria. 2. Patent anterior and posterior intracranial circulation. No large vessel occlusion, aneurysm, or significant stenosis. Electronically Signed   By: Kristine Garbe M.D.   On: 09/21/2017 14:27   Mr Brain Wo Contrast  Result Date: 09/21/2017 CLINICAL DATA:  52 y/o F; left-sided numbness, chest pain, headache. EXAM: MRI HEAD WITHOUT CONTRAST TECHNIQUE: Multiplanar, multiecho pulse sequences of the brain and surrounding structures were obtained without intravenous contrast. COMPARISON:  09/20/2017 CT head FINDINGS: Brain: No acute infarction, hemorrhage, hydrocephalus, extra-axial collection or mass lesion. Vascular: Normal flow voids. Skull and upper cervical spine: Normal marrow signal. Sinuses/Orbits: Negative. Other: None. IMPRESSION: No acute intracranial abnormality identified. Unremarkable MRI of the brain. Electronically Signed   By: Kristine Garbe M.D.   On: 09/21/2017  13:34      Subjective: Patient seen and examined the bedside this morning.  Remains comfortable.  Complains of some heaviness on the left lower extremity.  Discharge Exam: Vitals:   09/21/17 0811 09/21/17 1526  BP: (!) 155/104 126/81   Pulse: 85 83  Resp: 18 16  Temp: 97.8 F (36.6 C) 97.9 F (36.6 C)  SpO2: 100% 100%   Vitals:   09/21/17 0222 09/21/17 0406 09/21/17 0811 09/21/17 1526  BP: (!) 155/94 137/85 (!) 155/104 126/81  Pulse:  74 85 83  Resp:  18 18 16   Temp:  98 F (36.7 C) 97.8 F (36.6 C) 97.9 F (36.6 C)  TempSrc:  Oral Oral Oral  SpO2:  100% 100% 100%  Weight:      Height:        General: Pt is alert, awake, not in acute distress Cardiovascular: RRR, S1/S2 +, no rubs, no gallops Respiratory: CTA bilaterally, no wheezing, no rhonchi Abdominal: Soft, NT, ND, bowel sounds + Extremities: no edema, no cyanosis    The results of significant diagnostics from this hospitalization (including imaging, microbiology, ancillary and laboratory) are listed below for reference.     Microbiology: No results found for this or any previous visit (from the past 240 hour(s)).   Labs: BNP (last 3 results) No results for input(s): BNP in the last 8760 hours. Basic Metabolic Panel: Recent Labs  Lab 09/20/17 2157 09/21/17 0517  NA 136 140  K 2.9* 3.2*  CL 100* 105  CO2 24 25  GLUCOSE 108* 116*  BUN 12 8  CREATININE 0.83 0.80  CALCIUM 9.9 9.3  MG  --  2.6*   Liver Function Tests: Recent Labs  Lab 09/20/17 2157  AST 22  ALT 31  ALKPHOS 70  BILITOT 0.8  PROT 7.8  ALBUMIN 4.4   No results for input(s): LIPASE, AMYLASE in the last 168 hours. No results for input(s): AMMONIA in the last 168 hours. CBC: Recent Labs  Lab 09/20/17 2157  WBC 6.3  NEUTROABS 3.2  HGB 11.9*  HCT 34.5*  MCV 68.2*  PLT 223   Cardiac Enzymes: Recent Labs  Lab 09/20/17 2158 09/21/17 0517 09/21/17 1048  TROPONINI <0.03 <0.03 <0.03   BNP: Invalid input(s): POCBNP CBG: Recent Labs  Lab 09/20/17 2200  GLUCAP 108*   D-Dimer No results for input(s): DDIMER in the last 72 hours. Hgb A1c No results for input(s): HGBA1C in the last 72 hours. Lipid Profile Recent Labs    09/21/17 0517  CHOL 194  HDL  59  LDLCALC 122*  TRIG 64  CHOLHDL 3.3   Thyroid function studies No results for input(s): TSH, T4TOTAL, T3FREE, THYROIDAB in the last 72 hours.  Invalid input(s): FREET3 Anemia work up No results for input(s): VITAMINB12, FOLATE, FERRITIN, TIBC, IRON, RETICCTPCT in the last 72 hours. Urinalysis    Component Value Date/Time   COLORURINE YELLOW 06/13/2013 1154   APPEARANCEUR CLEAR 06/13/2013 1154   LABSPEC 1.013 06/13/2013 1154   PHURINE 6.0 06/13/2013 1154   GLUCOSEU NEG 06/13/2013 1154   HGBUR NEG 06/13/2013 1154   BILIRUBINUR NEG 06/13/2013 1154   KETONESUR NEG 06/13/2013 1154   PROTEINUR NEG 06/13/2013 1154   UROBILINOGEN 0.2 06/13/2013 1154   NITRITE NEG 06/13/2013 1154   LEUKOCYTESUR TRACE (A) 06/13/2013 1154   Sepsis Labs Invalid input(s): PROCALCITONIN,  WBC,  LACTICIDVEN Microbiology No results found for this or any previous visit (from the past 240 hour(s)).   Time coordinating discharge: 35 minutes  SIGNED:   Shelly Coss, MD  [facility] 09/21/2017, 5:11 PM Pager [phone]  If 7PM-7AM, please contact night-coverage [email or web address] Password [email or web address]

## 2017-09-22 MED FILL — ATORVASTATIN 20 MG TABLET: 20 | 30 days supply | Qty: 30 | Fill #0

## 2017-09-22 MED FILL — POTASSIUM CL ER 20 MEQ TABL: 20 | 5 days supply | Qty: 5 | Fill #0

## 2017-09-22 MED FILL — ASPIRIN ADULT LOW STRENGTH: 81 | 30 days supply | Qty: 30 | Fill #0

## 2017-09-28 DIAGNOSIS — I10 Essential (primary) hypertension: Secondary | ICD-10-CM | POA: Diagnosis not present

## 2017-09-28 DIAGNOSIS — M722 Plantar fascial fibromatosis: Secondary | ICD-10-CM | POA: Diagnosis not present

## 2017-09-28 MED FILL — LOSARTAN-HCTZ 50-12.5 MG TA: 50-12.5 | 30 days supply | Qty: 30 | Fill #0

## 2017-10-25 NOTE — Progress Notes (Signed)
[facility] [address] [place]. [place] [address] [phone]       OFFICE FOLLOW UP NOTE  [name] Date of Birth:  July 08, 1965 Medical Record Number:  [id number]   Reason for Referral:  hospital follow up  CHIEF COMPLAINT:  Chief Complaint  Patient presents with  . New Patient (Initial Visit)    Room 10, alone. Hospital f/u post stroke. Was at [facility] 5/7/8-09/21/17. Doing okay since discharged. Has noticed her left side gets fatigued when she is ambulating. Denies any falls.     HPI: Stacey Mendoza is being seen today for initial visit in the office for possible TIA versus anxiety on 09/20/2017. History obtained from patient and chart review. Reviewed all radiology images and labs personally.  Stacey Mendoza is a 52 y.o. African American female with PMH of HTN and migraine HA. She works in [employer] and after she was off work on 09/20/17, she had sudden onset chest pain, sharp at left side of the chest, and then she felt the pain went to her back of neck and then down to her left arm with left arm and hand numbness. She also felt some heaviness of left leg. She also had slight b/l frontal HA. She was sent to ER and tele stroke consulted. NIHSS = 0 and she was admitted for stroke work up. Currently, she denies any CP or numbness, but still fell heaviness at left arm and leg. She was able to walk with PT/OT but seems tired easily on the left.  Patient reports history of migraines but these are very infrequent and usually related to stress or be triggered by certain smell.  MRI head reviewed and showed no acute stroke.  CTA head and neck unremarkable.  2D echo showed EF of 60 to 65%.  LDL 122 recommended to start Lipitor 20 mg daily.  As patient was not on antithrombotic PTA was recommended to start aspirin 81 mg.  Per Dr. Phoebe Sharps notes, it was doubtful that this event was related to stroke or TIA or more likely related to CP and anxiety.  Patient was discharged home in stable  condition.  Patient is being seen today for hospital follow-up.  She continues to take aspirin with mild bruising but no bleeding.  Continues to take Lipitor with mild myalgias in bilateral lower extremities.  She states at times will feel like a mild cramping in bilateral calves and mild pain on the bottom of her feet.  She was also recently stopped taking hydrochlorothiazide and started on Hyzaar by her PCP.  Blood pressure is satisfactory at 133/90 but since starting this medication, patient has concerns of increased fatigue and decreased energy along with increased swelling in her feet and hands.  Patient also states that she will have increased weakness on the left side of her body with increased fatigue where her left leg and arm feel heavy but denies pain.  Reports that at times she will find herself dragging her left foot or tripping over it.  She is also been waking up with daily headaches and feeling "woozy" and this has been occurring for the past week.  Patient states that she did not experience daytime fatigue prior to this event and always had enough energy to be active during the day.  Denies new or worsening stroke/TIA symptoms.  ROS:   14 system review of systems performed and negative with exception of snoring  PMH:  Past Medical History:  Diagnosis Date  .  Anemia   . Bulging disc    cervical MRI pending  . Elevated LFTs   . Hypertension   . Normal vaginal delivery 1992, 1986, 1990    PSH:  Past Surgical History:  Procedure Laterality Date  . CHOLECYSTECTOMY N/A 07/06/2017   Procedure: LAPAROSCOPIC CHOLECYSTECTOMY;  Surgeon: Kinsinger, Arta Bruce, MD;  Location: [facility] ORS;  Service: General;  Laterality: N/A;  . TUBAL LIGATION      Social History:  Social History   Socioeconomic History  . Marital status: Single    Spouse name: Not on file  . Number of children: 3  . Years of education: Not on file  . Highest education level: Not on file  Occupational History  .  Occupation: Customer service manager: [employer]  . Financial resource strain: Not on file  . Food insecurity:    Worry: Not on file    Inability: Not on file  . Transportation needs:    Medical: Not on file    Non-medical: Not on file  Tobacco Use  . Smoking status: Never Smoker  . Smokeless tobacco: Never Used  Substance and Sexual Activity  . Alcohol use: No  . Drug use: No  . Sexual activity: Yes  Lifestyle  . Physical activity:    Days per week: Not on file    Minutes per session: Not on file  . Stress: Not on file  Relationships  . Social connections:    Talks on phone: Not on file    Gets together: Not on file    Attends religious service: Not on file    Active member of club or organization: Not on file    Attends meetings of clubs or organizations: Not on file    Relationship status: Not on file  . Intimate partner violence:    Fear of current or ex partner: Not on file    Emotionally abused: Not on file    Physically abused: Not on file    Forced sexual activity: Not on file  Other Topics Concern  . Not on file  Social History Narrative   Lives alone   Caffeine use: No coffee, no soda   Herbal tea sometimes- no caffeine   Right handed     Family History:  Family History  Problem Relation Age of Onset  . Heart disease Father        HEART ATTACK  . Breast cancer Sister 33  . Cancer Sister        LIVER  . Hypertension Sister   . Hypertension Mother   . Diabetes Mother   . Heart disease Mother   . Throat cancer Mother   . Hypertension Brother   . Cancer Other 25       LIVER  . Hypertension Sister   . Hypertension Sister   . Colon cancer Neg Hx     Medications:   Current Outpatient Medications on File Prior to Visit  Medication Sig Dispense Refill  . aspirin EC 81 MG EC tablet Take 1 tablet (81 mg total) by mouth daily. 30 tablet 0  . losartan-hydrochlorothiazide (HYZAAR) 50-12.5 MG tablet Take 1 tablet by mouth daily.  1    No current facility-administered medications on file prior to visit.     Allergies:   Allergies  Allergen Reactions  . Vicodin [Hydrocodone-Acetaminophen]     HALLUCINATIONS     Physical Exam  Vitals:   10/26/17 0721  BP: 133/90  Pulse:  82  Weight: 187 lb 9.6 oz (85.1 kg)  Height: 5\' 4"  (1.626 m)   Body mass index is 32.2 kg/m. No exam data present  General: well developed, pleasant middle-aged African-American female, well nourished, seated, in no evident distress Head: head normocephalic and atraumatic.   Neck: supple with no carotid or supraclavicular bruits Cardiovascular: regular rate and rhythm, no murmurs Musculoskeletal: no deformity Skin:  no rash/petichiae Vascular:  Normal pulses all extremities  Neurologic Exam Mental Status: Awake and fully alert. Oriented to place and time. Recent and remote memory intact. Attention span, concentration and fund of knowledge appropriate. Mood and affect appropriate.  Cranial Nerves: Fundoscopic exam reveals sharp disc margins. Pupils equal, briskly reactive to light. Extraocular movements full without nystagmus. Visual fields full to confrontation. Hearing intact. Facial sensation intact. Face, tongue, palate moves normally and symmetrically.  Motor: Normal bulk and tone. Normal strength in all tested extremity muscles.  Giveaway weakness noted in left upper and lower extremity Sensory.: intact to touch , pinprick , position and vibratory sensation.  Coordination: Rapid alternating movements normal in all extremities. Finger-to-nose and heel-to-shin performed accurately bilaterally. Gait and Station: Arises from chair without difficulty. Stance is normal. Gait demonstrates normal stride length and balance . Able to heel, toe and tandem walk without difficulty.  Reflexes: 1+ and symmetric. Toes downgoing.    NIHSS  0 Modified Rankin  1    ASSESSMENT: Stacey Mendoza is a 52 y.o. year old female here with possible TIA  versus anxiety on 09/20/2017. Vascular risk factors include HTN.    PLAN: -Continue aspirin 81 mg daily  and Lipitor for secondary stroke prevention advised patient -Patient preferred to continue to take Lipitor at this time and see if her myalgias subside over time as they are not debilitating.  Recommended co-Q10 200 mg daily to help with muscle aches and pains -Patient will consider sleep study in the future to assess for possible sleep apnea as she does have daytime sleepiness, snoring and insomnia -patient would like to hold off on sleep study at this time and possibly consider in the future -F/u with PCP regarding your HLD and HTN management -advised patient to notify PCP of possible side effects from new antihypertensive -f/u with cardiology appointment on 11/21/2017 -Order placed for PT for subjective left-sided weakness strengthening -continue to monitor BP at home -Maintain strict control of hypertension with blood pressure goal below 130/90, diabetes with hemoglobin A1c goal below 6.5% and cholesterol with LDL cholesterol (bad cholesterol) goal below 70 mg/dL. I also advised the patient to eat a healthy diet with plenty of whole grains, cereals, fruits and vegetables, exercise regularly and maintain ideal body weight.  Follow up in 4 months or call earlier if needed   Greater than 50% of time during this 25 minute visit was spent on counseling,explanation of diagnosis of TIA versus anxiety, reviewing risk factor management of HLD and HTN, planning of further management, discussion with patient and family and coordination of care    Venancio Poisson, [facility]  [facility] [address] [place], [place] [address]  Phone [phone] Fax [phone]

## 2017-10-26 ENCOUNTER — Encounter: Payer: Self-pay | Admitting: Adult Health

## 2017-10-26 ENCOUNTER — Other Ambulatory Visit: Payer: Self-pay

## 2017-10-26 ENCOUNTER — Ambulatory Visit: Payer: 59 | Admitting: Adult Health

## 2017-10-26 VITALS — BP 133/90 | HR 82 | Ht 64.0 in | Wt 187.6 lb

## 2017-10-26 DIAGNOSIS — E785 Hyperlipidemia, unspecified: Secondary | ICD-10-CM | POA: Diagnosis not present

## 2017-10-26 DIAGNOSIS — I10 Essential (primary) hypertension: Secondary | ICD-10-CM

## 2017-10-26 DIAGNOSIS — G459 Transient cerebral ischemic attack, unspecified: Secondary | ICD-10-CM | POA: Diagnosis not present

## 2017-10-26 MED ORDER — ATORVASTATIN CALCIUM 20 MG PO TABS: 20.0000 mg | ORAL_TABLET | Freq: Every day | ORAL | 3 refills | Status: DC

## 2017-10-26 MED FILL — ATORVASTATIN 20 MG TABLET: 20 | 90 days supply | Qty: 90 | Fill #0

## 2017-10-26 NOTE — Patient Instructions (Addendum)
Continue aspirin 81 mg daily  and lipitor  for secondary stroke prevention  If the lipitor continues with muscle aches or pains or gets worse, please call and we can the medication; you can also try CoQ10 200mg  daily to help with muscle aches and pains  Placed order for physical therapy to help strengthen left side  Continue to follow up with PCP regarding cholesterol and blood pressure management   Continue to monitor blood pressure at home  Maintain strict control of hypertension with blood pressure goal below 130/90, diabetes with hemoglobin A1c goal below 6.5% and cholesterol with LDL cholesterol (bad cholesterol) goal below 70 mg/dL. I also advised the patient to eat a healthy diet with plenty of whole grains, cereals, fruits and vegetables, exercise regularly and maintain ideal body weight.  Followup in the future with me in 4 months or call earlier if needed        Thank you for coming to see [place] at [facility]. I hope we have been able to provide you high quality care today.  You may receive a patient satisfaction survey over the next few weeks. We would appreciate your feedback and comments so that we may continue to improve ourselves and the health of our patients.

## 2017-10-26 NOTE — Progress Notes (Signed)
I agree with the above plan 

## 2017-10-27 MED FILL — LOSARTAN-HCTZ 50-12.5 MG TA: 50-12.5 | 30 days supply | Qty: 30 | Fill #1

## 2017-10-31 DIAGNOSIS — L309 Dermatitis, unspecified: Secondary | ICD-10-CM | POA: Diagnosis not present

## 2017-10-31 DIAGNOSIS — I10 Essential (primary) hypertension: Secondary | ICD-10-CM | POA: Diagnosis not present

## 2017-11-01 MED FILL — AMLODIPINE BESYLATE 5 MG TA: 5 | 30 days supply | Qty: 30 | Fill #0

## 2017-11-01 MED FILL — HYDROCHLOROTHIAZIDE 12.5 MG: 12.5 | 30 days supply | Qty: 30 | Fill #0

## 2017-11-18 NOTE — Progress Notes (Signed)
Cardiology Office Note   Date:  11/21/2017   ID:  Stacey Mendoza, DOB 30-Nov-1965, MRN [id number]  PCP:  Aretta Nip, MD  Cardiologist:  Dr. Johnsie Cancel    Chief Complaint  Patient presents with  . Chest Pain  . Coronary Artery Disease      History of Present Illness: Stacey Mendoza is a 52 y.o. female who presents for CAD and chest pain.   +FH of CAD and TIA, sister with MI at age 35, another sister with TIA.  Mother with hx stent.  Last seen by Dr. Johnsie Cancel 10/2015.  Was admitted 09/20/17 with hypertension at 180/100.  K+ was 2.9.  Troponin neg.  Also with weakness and numbness on Lt side.  Was seen by neuro -CT head unremarkable.  MRI of brain was normal.  CT angio of head and neck with patent carotid and vertebral arteries and no dissection.    Echo 09/21/17 with EF 60-65%   Today she has noted chest pain, mid sternal that occurs with exercise. No associated symptoms of N/V, SOB or diaphoresis.  She stops her activity and symptoms resolve.  With hospitalization in May she did have ST depression in inf and ant lat leads on EKG though troponins were neg.  Today EKG imrproved and more similar to EKG 2017.  The prior EKG changes could be related to TIA.   But with her family hx she is high risk for CAD.     Past Medical History:  Diagnosis Date  . Abdominal pain, chronic, right upper quadrant 11/28/2013  . Abdominal pain, right lower quadrant 06/29/2013  . Anemia   . Bulging disc    cervical MRI pending  . Chest pain 09/21/2017  . Dyspepsia and other specified disorders of function of stomach 04/10/2013  . Elevated LFTs   . Family history of breast cancer in female 11/28/2013  . Hypertension   . Hypokalemia 09/21/2017  . Midepigastric pain 10/23/2012  . Migraine headache 06/13/2013  . Nonspecific abnormal results of liver function study 06/29/2013  . Normal vaginal delivery 1992, 1986, 1990  . TIA (transient ischemic attack) 09/21/2017  . Unspecified constipation 10/23/2012    Past  Surgical History:  Procedure Laterality Date  . CHOLECYSTECTOMY N/A 07/06/2017   Procedure: LAPAROSCOPIC CHOLECYSTECTOMY;  Surgeon: Kinsinger, Arta Bruce, MD;  Location: [facility];  Service: General;  Laterality: N/A;  . TUBAL LIGATION       Current Outpatient Medications  Medication Sig Dispense Refill  . amLODipine (NORVASC) 5 MG tablet Take 5 mg by mouth daily.    Marland Kitchen aspirin EC 81 MG EC tablet Take 1 tablet (81 mg total) by mouth daily. 30 tablet 0  . atorvastatin (LIPITOR) 20 MG tablet Take 1 tablet (20 mg total) by mouth daily at 6 PM. 90 tablet 3  . losartan-hydrochlorothiazide (HYZAAR) 50-12.5 MG tablet Take 1 tablet by mouth daily.  1   No current facility-administered medications for this visit.     Allergies:   Vicodin [hydrocodone-acetaminophen]    Social History:  The patient  reports that she has never smoked. She has never used smokeless tobacco. She reports that she does not drink alcohol or use drugs.   Family History:  The patient's family history includes Breast cancer (age of onset: 28) in her sister; Cancer in her sister; Cancer (age of onset: 77) in her other; Diabetes in her mother; Heart disease in her father and mother; Hypertension in her brother, mother, sister, sister, and sister; Throat  cancer in her mother.    ROS:  General:no colds or fevers, no weight changes Skin:no rashes or ulcers HEENT:no blurred vision, no congestion CV:see HPI PUL:see HPI GI:no diarrhea constipation or melena, no indigestion GU:no hematuria, no dysuria MS:no joint pain, no claudication Neuro:no syncope, no lightheadedness Endo:no diabetes, no thyroid disease GYN: LMP 11/12/17 denies pregnancy  Wt Readings from Last 3 Encounters:  11/21/17 185 lb (83.9 kg)  10/26/17 187 lb 9.6 oz (85.1 kg)  09/21/17 184 lb 11.9 oz (83.8 kg)     PHYSICAL EXAM: VS:  BP (!) 142/90   Pulse 84   Ht 5\' 4"  (1.626 m)   Wt 185 lb (83.9 kg)   LMP 11/12/2017   SpO2 99%   BMI 31.76 kg/m  , BMI  Body mass index is 31.76 kg/m. General:Pleasant affect, NAD Skin:Warm and dry, brisk capillary refill HEENT:normocephalic, sclera clear, mucus membranes moist Neck:supple, no JVD, no bruits  Heart:S1S2 RRR without murmur, gallup, rub or click Lungs:clear without rales, rhonchi, or wheezes UXN:ATFT, non tender, + BS, do not palpate liver spleen or masses Ext:no lower ext edema, 2+ pedal pulses, 2+ radial pulses Neuro:alert and oriented X 3, MAE, follows commands, + facial symmetry    EKG:  EKG is ordered today. The ekg ordered today demonstrates SR with mild ST depression III, otherwise no changes from 2017 but improved from 09/2017.     Recent Labs: 09/20/2017: ALT 31; Hemoglobin 11.9; Platelets 223 09/21/2017: BUN 8; Creatinine, Ser 0.80; Magnesium 2.6; Potassium 3.2; Sodium 140    Lipid Panel    Component Value Date/Time   CHOL 194 09/21/2017 0517   TRIG 64 09/21/2017 0517   HDL 59 09/21/2017 0517   CHOLHDL 3.3 09/21/2017 0517   VLDL 13 09/21/2017 0517   LDLCALC 122 (H) 09/21/2017 0517       Other studies Reviewed: Additional studies/ records that were reviewed today include: . Echo 09/21/17 Study Conclusions  - Left ventricle: The cavity size was normal. Wall thickness was   normal. Systolic function was normal. The estimated ejection   fraction was in the range of 60% to 65%. Wall motion was normal;   there were no regional wall motion abnormalities. Left   ventricular diastolic function parameters were normal. - Aortic valve: Structurally normal valve. Trileaflet. Cusp   separation was normal. Transvalvular velocity was minimally   increased. There was no stenosis. There was no regurgitation. - Mitral valve: Mildly thickened leaflets . There was trivial   regurgitation. - Left atrium: The atrium was normal in size. - Tricuspid valve: There was trivial regurgitation. - Pulmonary arteries: PA peak pressure: 23 mm Hg (S). - Inferior vena cava: The vessel was normal  in size. The   respirophasic diameter changes were in the normal range (= 50%),   consistent with normal central venous pressure.  Impressions:  - LVEF 60-65%, normal wall thickness, normal wall motion, normal   diastolic function, trivial MR, normal LA size, trivial TR, RVSP   23 mmHg, normal IVC.   ASSESSMENT AND PLAN:  1.  Chest pain, most likely exertional angina and with abnormal EKG 09/2017, in addition to strong  FH of CAD and TIA.  Will plan for Cardiac CTA with FFR with BB prior to study.   If symptoms increase she will call us and we may need to proceed with cath, and if symptoms severe she will go to ER.  She will need follow up with me or Dr. Johnsie Cancel after test.  2.  Recent TIA  3.   HLD on statin, continue  4.   HTN treated, mildly elevated today but improved from May.    5.   Premature FH CAD.     Current medicines are reviewed with the patient today.  The patient Has no concerns regarding medicines.  The following changes have been made:  See above Labs/ tests ordered today include:see above  Disposition:   FU:  see above  Signed, Cecilie Kicks, NP  11/21/2017 9:02 AM    [address], [place], [place], [place] Phone: [phone]; Fax: [phone]

## 2017-11-21 ENCOUNTER — Encounter: Payer: Self-pay | Admitting: Cardiology

## 2017-11-21 ENCOUNTER — Encounter (INDEPENDENT_AMBULATORY_CARE_PROVIDER_SITE_OTHER): Payer: Self-pay

## 2017-11-21 ENCOUNTER — Ambulatory Visit: Payer: 59 | Admitting: Cardiology

## 2017-11-21 VITALS — BP 142/90 | HR 84 | Ht 64.0 in | Wt 185.0 lb

## 2017-11-21 DIAGNOSIS — I10 Essential (primary) hypertension: Secondary | ICD-10-CM

## 2017-11-21 DIAGNOSIS — R079 Chest pain, unspecified: Secondary | ICD-10-CM

## 2017-11-21 DIAGNOSIS — R9431 Abnormal electrocardiogram [ECG] [EKG]: Secondary | ICD-10-CM

## 2017-11-21 DIAGNOSIS — E782 Mixed hyperlipidemia: Secondary | ICD-10-CM | POA: Diagnosis not present

## 2017-11-21 DIAGNOSIS — Z8249 Family history of ischemic heart disease and other diseases of the circulatory system: Secondary | ICD-10-CM

## 2017-11-21 LAB — BASIC METABOLIC PANEL
BUN/Creatinine Ratio: 13 (ref 9–23)
BUN: 11 mg/dL (ref 6–24)
CALCIUM: 9.9 mg/dL (ref 8.7–10.2)
CHLORIDE: 98 mmol/L (ref 96–106)
CO2: 24 mmol/L (ref 20–29)
Creatinine, Ser: 0.87 mg/dL (ref 0.57–1.00)
GFR calc non Af Amer: 78 mL/min/{1.73_m2} (ref >59)
GFR, EST AFRICAN AMERICAN: 90 mL/min/{1.73_m2} (ref >59)
Glucose: 115 mg/dL — ABNORMAL HIGH (ref 65–99)
Potassium: 3.6 mmol/L (ref 3.5–5.2)
Sodium: 137 mmol/L (ref 134–144)

## 2017-11-21 MED ORDER — METOPROLOL TARTRATE 50 MG PO TABS: 50.0000 mg | ORAL_TABLET | Freq: Once | ORAL | 0 refills | Status: DC

## 2017-11-21 MED FILL — METOPROLOL TARTRATE 50 MG T: 50 | 1 days supply | Qty: 1 | Fill #0

## 2017-11-21 NOTE — Patient Instructions (Addendum)
Medication Instructions:  1. Your physician recommends that you continue on your current medications as directed. Please refer to the Current Medication list given to you today.  2. TAKE METOPROLOL 50 MG 1 HOUR BEFORE TESTING.    Labwork: TODAY: BMET  Testing/Procedures: CORONARY CT-A TO BE DONE AT [facility].  Follow-Up: Your physician recommends that you schedule a follow-up appointment WITH DR. Johnsie Cancel OR Cecilie Kicks, NP AFTER TESTING IS COMPLETE.   Any Other Special Instructions Will Be Listed Below (If Applicable).  Please arrive at the [facility] main entrance of [facility] [place], [place] [address] [phone]  Proceed to the [facility] Radiology Department (First Floor).  Please follow these instructions carefully (unless otherwise directed):   On the Night Before the Test: . Drink plenty of water. . Do not consume any caffeinated/decaffeinated beverages or chocolate 12 hours prior to your test. . Do not take any antihistamines 12 hours prior to your test.  On the Day of the Test: . Drink plenty of water. Do not drink any water within one hour of the test. . Do not eat any food 4 hours prior to the test. . You may take your regular medications prior to the test. . IF NOT ON A BETA BLOCKER - Take 50 mg of lopressor (metoprolol) one hour before the test.  After the Test: . Drink plenty of water. . After receiving IV contrast, you may experience a mild flushed feeling. This is normal. . On occasion, you may experience a mild rash up to 24 hours after the test. This is not dangerous. If this occurs, you can take Benadryl 25 mg and increase your fluid intake. . If you experience trouble breathing, this can be serious. If it is severe call 911 IMMEDIATELY. If it is mild, please call our office. . If you take any of these medications: Glipizide/Metformin, Avandament, Glucavance, please do not take 48  hours after completing test.     If you need a refill on your cardiac medications before your next appointment, please call your pharmacy.

## 2017-11-28 DIAGNOSIS — I10 Essential (primary) hypertension: Secondary | ICD-10-CM | POA: Diagnosis not present

## 2017-11-28 DIAGNOSIS — E7800 Pure hypercholesterolemia, unspecified: Secondary | ICD-10-CM | POA: Diagnosis not present

## 2017-11-28 DIAGNOSIS — Z8249 Family history of ischemic heart disease and other diseases of the circulatory system: Secondary | ICD-10-CM | POA: Diagnosis not present

## 2017-11-28 MED FILL — HYDROCHLOROTHIAZIDE 12.5 MG: 12.5 | 90 days supply | Qty: 90 | Fill #0

## 2017-11-28 MED FILL — AMLODIPINE BESYLATE 5 MG TA: 5 | 90 days supply | Qty: 90 | Fill #0

## 2017-12-19 ENCOUNTER — Ambulatory Visit (HOSPITAL_COMMUNITY)
Admission: RE | Admit: 2017-12-19 | Discharge: 2017-12-19 | Disposition: A | Discharge status: Home / Self Care | Payer: 59 | Source: Ambulatory Visit | Attending: Cardiology | Admitting: Cardiology

## 2017-12-19 DIAGNOSIS — R079 Chest pain, unspecified: Secondary | ICD-10-CM | POA: Insufficient documentation

## 2017-12-19 DIAGNOSIS — R9431 Abnormal electrocardiogram [ECG] [EKG]: Secondary | ICD-10-CM | POA: Diagnosis not present

## 2017-12-19 MED ORDER — METOPROLOL TARTRATE 5 MG/5ML IV SOLN: 10.0000 mg | Freq: Once | INTRAVENOUS | Status: DC

## 2017-12-19 MED ORDER — IOPAMIDOL (ISOVUE-370) INJECTION 76%
INTRAVENOUS | Status: AC
  Administered 2017-12-19: 80 mL
  Filled 2017-12-19: qty 100

## 2017-12-19 MED ORDER — METOPROLOL TARTRATE 5 MG/5ML IV SOLN
INTRAVENOUS | Status: AC
  Filled 2017-12-19: qty 10

## 2017-12-19 MED ORDER — METOPROLOL TARTRATE 5 MG/5ML IV SOLN
5.0000 mg | INTRAVENOUS | Status: DC | PRN
  Administered 2017-12-19 (×2): 5 mg via INTRAVENOUS

## 2017-12-19 MED ORDER — NITROGLYCERIN 0.4 MG SL SUBL
0.8000 mg | SUBLINGUAL_TABLET | Freq: Once | SUBLINGUAL | Status: AC
  Administered 2017-12-19: 0.8 mg via SUBLINGUAL

## 2017-12-19 MED ORDER — NITROGLYCERIN 0.4 MG SL SUBL
SUBLINGUAL_TABLET | SUBLINGUAL | Status: AC
  Filled 2017-12-19: qty 2

## 2017-12-19 NOTE — Progress Notes (Signed)
Ct complete. Patient complains of headache and lightheaded. Patients feet elevated in chair and eating crackers and drink at bedside.

## 2017-12-19 NOTE — Progress Notes (Signed)
Patient states she is feeling better and would like to go home. Patient taken to car via wc. Family driving patient home. Patient did report of nausea when getting into car but stated she wanted to go home and go to bed. Writer offered for patient to come back to department.

## 2017-12-20 ENCOUNTER — Telehealth: Payer: Self-pay | Admitting: Cardiology

## 2017-12-20 NOTE — Telephone Encounter (Signed)
Returned pts call and she has been made aware of her CT results. See result note.

## 2017-12-20 NOTE — Progress Notes (Signed)
Pt has been made aware of normal result and verbalized understanding.  jw 12/20/17

## 2017-12-20 NOTE — Telephone Encounter (Signed)
New problem   Pt stated she is returning call to nurse concerning results. Please call pt.

## 2017-12-20 NOTE — Telephone Encounter (Signed)
-----   Message from Isaiah Serge, NP sent at 12/20/2017  3:14 PM EDT ----- Excellent news no CAD , calcium score is 0,  Excellent.  We do no think the chest pain is cardiac.  We will send copy to PCP.  In Addition the lungs were clear as well.

## 2018-01-23 ENCOUNTER — Ambulatory Visit: Payer: 59 | Admitting: Cardiology

## 2018-02-06 DIAGNOSIS — I10 Essential (primary) hypertension: Secondary | ICD-10-CM | POA: Diagnosis not present

## 2018-02-06 DIAGNOSIS — G4700 Insomnia, unspecified: Secondary | ICD-10-CM | POA: Diagnosis not present

## 2018-02-06 DIAGNOSIS — R5383 Other fatigue: Secondary | ICD-10-CM | POA: Diagnosis not present

## 2018-02-06 DIAGNOSIS — E559 Vitamin D deficiency, unspecified: Secondary | ICD-10-CM | POA: Diagnosis not present

## 2018-02-06 DIAGNOSIS — D509 Iron deficiency anemia, unspecified: Secondary | ICD-10-CM | POA: Diagnosis not present

## 2018-02-06 DIAGNOSIS — R0681 Apnea, not elsewhere classified: Secondary | ICD-10-CM | POA: Diagnosis not present

## 2018-02-20 ENCOUNTER — Ambulatory Visit: Payer: 59 | Admitting: Cardiology

## 2018-02-20 MED FILL — HYDROCHLOROTHIAZIDE 12.5 MG: 12.5 | 90 days supply | Qty: 90 | Fill #0

## 2018-02-20 MED FILL — AMLODIPINE BESYLATE 5 MG TA: 5 | 90 days supply | Qty: 90 | Fill #0

## 2018-02-28 NOTE — Progress Notes (Signed)
[facility] [address] [place]. [place] [address] [phone]       OFFICE FOLLOW UP NOTE  [name] Date of Birth:  04/06/1966 Medical Record Number:  [id number]   Reason for Referral:  hospital follow up  CHIEF COMPLAINT:  Chief Complaint  Patient presents with  . Follow-up    Pt says that she has been doing very well. She will need refills today.     HPI: Stacey Mendoza is being seen today in the office for possible TIA versus anxiety on 09/20/2017. History obtained from patient and chart review. Reviewed all radiology images and labs personally.  Stacey Mendoza is a 52 y.o. African American female with PMH of HTN and migraine HA. She works in [employer] and after she was off work on 09/20/17, she had sudden onset chest pain, sharp at left side of the chest, and then she felt the pain went to her back of neck and then down to her left arm with left arm and hand numbness. She also felt some heaviness of left leg. She also had slight b/l frontal HA. She was sent to ER and tele stroke consulted. NIHSS = 0 and she was admitted for stroke work up. Currently, she denies any CP or numbness, but still fell heaviness at left arm and leg. She was able to walk with PT/OT but seems tired easily on the left.  Patient reports history of migraines but these are very infrequent and usually related to stress or be triggered by certain smell.  MRI head reviewed and showed no acute stroke.  CTA head and neck unremarkable.  2D echo showed EF of 60 to 65%.  LDL 122 recommended to start Lipitor 20 mg daily.  As patient was not on antithrombotic PTA was recommended to start aspirin 81 mg.  Per Dr. Phoebe Sharps notes, it was doubtful that this event was related to stroke or TIA or more likely related to CP and anxiety.  Patient was discharged home in stable condition.  10/26/17 visit: Patient is being seen today for hospital follow-up.  She continues to take aspirin with mild bruising but no  bleeding.  Continues to take Lipitor with mild myalgias in bilateral lower extremities.  She states at times will feel like a mild cramping in bilateral calves and mild pain on the bottom of her feet.  She was also recently stopped taking hydrochlorothiazide and started on Hyzaar by her PCP.  Blood pressure is satisfactory at 133/90 but since starting this medication, patient has concerns of increased fatigue and decreased energy along with increased swelling in her feet and hands.  Patient also states that she will have increased weakness on the left side of her body with increased fatigue where her left leg and arm feel heavy but denies pain.  Reports that at times she will find herself dragging her left foot or tripping over it.  She is also been waking up with daily headaches and feeling "woozy" and this has been occurring for the past week.  Patient states that she did not experience daytime fatigue prior to this event and always had enough energy to be active during the day.  Denies new or worsening stroke/TIA symptoms.  Interval history 02/28/18: Patient is being seen today for scheduled follow-up visit.  She states overall she is doing well from a stroke standpoint without recurrent TIA-like symptoms.  She continues to take aspirin with mild bruising but no bleeding.  Continues to take Lipitor  without side effects of myalgias.  Blood pressure today satisfactory 130/81.  She does have concerns of vertigo sensations recently that had also occurred last year but subsided until now.  She does check her blood pressure with increased vertigo sensations but denies any change.  She describes the vertigo as a room spinning sensation and states that she holds her head still it will subside.  At times these can be accompanied by nausea and slight headache.  She has been able to associate any kind of rapid head movement or position changes that make this worse but also states that she has not been paying attention to  her movements prior to this sensation as she was told by her primary doctor that is due to standing up too quickly therefore she was attempting to ignore the sensation.  She continues to also expressed daytime sleepiness with snoring but continues to decline OSA work-up.  No further concerns at this time.  Denies new or worsening stroke/TIA symptoms.      ROS:   14 system review of systems performed and negative with exception of snoring, daytime sleepiness and anemia  PMH:  Past Medical History:  Diagnosis Date  . Abdominal pain, chronic, right upper quadrant 11/28/2013  . Abdominal pain, right lower quadrant 06/29/2013  . Anemia   . Bulging disc    cervical MRI pending  . Chest pain 09/21/2017  . Dyspepsia and other specified disorders of function of stomach 04/10/2013  . Elevated LFTs   . Family history of breast cancer in female 11/28/2013  . Hypertension   . Hypokalemia 09/21/2017  . Midepigastric pain 10/23/2012  . Migraine headache 06/13/2013  . Nonspecific abnormal results of liver function study 06/29/2013  . Normal vaginal delivery 1992, 1986, 1990  . TIA (transient ischemic attack) 09/21/2017  . Unspecified constipation 10/23/2012    PSH:  Past Surgical History:  Procedure Laterality Date  . CHOLECYSTECTOMY N/A 07/06/2017   Procedure: LAPAROSCOPIC CHOLECYSTECTOMY;  Surgeon: Kinsinger, Arta Bruce, MD;  Location: [facility];  Service: General;  Laterality: N/A;  . TUBAL LIGATION      Social History:  Social History   Socioeconomic History  . Marital status: Single    Spouse name: Not on file  . Number of children: 3  . Years of education: Not on file  . Highest education level: Not on file  Occupational History  . Occupation: Customer service manager: [employer]  . Financial resource strain: Not on file  . Food insecurity:    Worry: Not on file    Inability: Not on file  . Transportation needs:    Medical: Not on file    Non-medical: Not on file    Tobacco Use  . Smoking status: Never Smoker  . Smokeless tobacco: Never Used  Substance and Sexual Activity  . Alcohol use: No  . Drug use: No  . Sexual activity: Yes  Lifestyle  . Physical activity:    Days per week: Not on file    Minutes per session: Not on file  . Stress: Not on file  Relationships  . Social connections:    Talks on phone: Not on file    Gets together: Not on file    Attends religious service: Not on file    Active member of club or organization: Not on file    Attends meetings of clubs or organizations: Not on file    Relationship status: Not on file  . Intimate  partner violence:    Fear of current or ex partner: Not on file    Emotionally abused: Not on file    Physically abused: Not on file    Forced sexual activity: Not on file  Other Topics Concern  . Not on file  Social History Narrative   Lives alone   Caffeine use: No coffee, no soda   Herbal tea sometimes- no caffeine   Right handed     Family History:  Family History  Problem Relation Age of Onset  . Heart disease Father        HEART ATTACK  . Breast cancer Sister 40  . Cancer Sister        LIVER  . Hypertension Sister   . Hypertension Mother   . Diabetes Mother   . Heart disease Mother   . Throat cancer Mother   . Hypertension Brother   . Cancer Other 25       LIVER  . Hypertension Sister   . Hypertension Sister   . Colon cancer Neg Hx     Medications:   Current Outpatient Medications on File Prior to Visit  Medication Sig Dispense Refill  . amLODipine (NORVASC) 5 MG tablet Take 5 mg by mouth daily.    Marland Kitchen aspirin EC 81 MG EC tablet Take 1 tablet (81 mg total) by mouth daily. 30 tablet 0  . atorvastatin (LIPITOR) 20 MG tablet Take 1 tablet (20 mg total) by mouth daily at 6 PM. 90 tablet 3  . hydrochlorothiazide (MICROZIDE) 12.5 MG capsule Take 12.5 mg by mouth daily.  3   No current facility-administered medications on file prior to visit.     Allergies:   Allergies   Allergen Reactions  . Vicodin [Hydrocodone-Acetaminophen]     HALLUCINATIONS     Physical Exam  Vitals:   03/01/18 0802  BP: 130/81  Pulse: 78  Weight: 182 lb 12.8 oz (82.9 kg)  Height: 5\' 4"  (1.626 m)   Body mass index is 31.38 kg/m. No exam data present  General: well developed, pleasant middle-aged African-American female, well nourished, seated, in no evident distress Head: head normocephalic and atraumatic.   Neck: supple with no carotid or supraclavicular bruits Cardiovascular: regular rate and rhythm, no murmurs Musculoskeletal: no deformity Skin:  no rash/petichiae Vascular:  Normal pulses all extremities  Neurologic Exam Mental Status: Awake and fully alert. Oriented to place and time. Recent and remote memory intact. Attention span, concentration and fund of knowledge appropriate. Mood and affect appropriate.  Cranial Nerves: Fundoscopic exam reveals sharp disc margins. Pupils equal, briskly reactive to light. Extraocular movements full without nystagmus. Visual fields full to confrontation. Hearing intact. Facial sensation intact. Face, tongue, palate moves normally and symmetrically.  Motor: Normal bulk and tone. Normal strength in all tested extremity muscles.  Sensory.: intact to touch , pinprick , position and vibratory sensation.  Coordination: Rapid alternating movements normal in all extremities. Finger-to-nose and heel-to-shin performed accurately bilaterally. Gait and Station: Arises from chair without difficulty. Stance is normal. Gait demonstrates normal stride length and balance . Able to heel, toe and tandem walk without difficulty.  Romberg negative. Reflexes: 1+ and symmetric. Toes downgoing.       ASSESSMENT: Stacey Mendoza is a 52 y.o. year old female here with possible TIA versus anxiety on 09/20/2017. Vascular risk factors include HTN.  Patient is being seen today for scheduled follow-up visit and overall doing well from a stroke standpoint but  has been experiencing recent vertigo  sensation which also occurred last year.   PLAN: -Continue aspirin 81 mg daily  and Lipitor for secondary stroke prevention advised patient -request PCP to continue to refill -F/u with PCP regarding your HLD and HTN management  -Vertigo -could be due to BPPV and reviewed with patient that this is treated with vestibular rehab.  She would like to speak with her PCP regarding this first and then obtain possible referral from them or will call us back if referral needed.  Advised patient that if treatment does not subside her symptoms, possible need for ENT referral.  No need for any imaging at this time as patient did experience this last year and subsided.  Did advise patient to log specific movements that precede vertigo sensation.  Patient was provided with information regarding vertigo, BPPV and treatment. -Advised to continue to stay active and maintain a healthy diet. -continue to monitor BP at home -Maintain strict control of hypertension with blood pressure goal below 130/90, diabetes with hemoglobin A1c goal below 6.5% and cholesterol with LDL cholesterol (bad cholesterol) goal below 70 mg/dL. I also advised the patient to eat a healthy diet with plenty of whole grains, cereals, fruits and vegetables, exercise regularly and maintain ideal body weight.  Follow up in 6 months or call earlier if needed   Greater than 50% of time during this 25 minute visit was spent on counseling,explanation of diagnosis of TIA versus anxiety, reviewing risk factor management of HLD and HTN, planning of further management, discussion with patient and family and coordination of care    Venancio Poisson, [facility]  [facility] [address] [place], [place] [address]  Phone [phone] Fax [phone]

## 2018-03-01 ENCOUNTER — Encounter: Payer: Self-pay | Admitting: Adult Health

## 2018-03-01 ENCOUNTER — Ambulatory Visit: Payer: 59 | Admitting: Adult Health

## 2018-03-01 VITALS — BP 130/81 | HR 78 | Ht 64.0 in | Wt 182.8 lb

## 2018-03-01 DIAGNOSIS — E785 Hyperlipidemia, unspecified: Secondary | ICD-10-CM

## 2018-03-01 DIAGNOSIS — I10 Essential (primary) hypertension: Secondary | ICD-10-CM | POA: Diagnosis not present

## 2018-03-01 DIAGNOSIS — G459 Transient cerebral ischemic attack, unspecified: Secondary | ICD-10-CM

## 2018-03-01 NOTE — Patient Instructions (Signed)
Continue aspirin 81 mg daily  and lipitor  for secondary stroke prevention  Continue to follow up with PCP regarding cholesterol and blood pressure management   Vertigo - most likely BPPV (benign paroxsymal positional vertigo) - typically treated with vestibular rehab. If this does not help, you and your primary doctor can talk about possible ENT referral. If you are interested in this treatment, speak to your primary doctor or call office so orders can be placed.   Continue to stay active as tolerated and maintain a healthy diet  Continue to monitor blood pressure at home  Maintain strict control of hypertension with blood pressure goal below 130/90, diabetes with hemoglobin A1c goal below 6.5% and cholesterol with LDL cholesterol (bad cholesterol) goal below 70 mg/dL. I also advised the patient to eat a healthy diet with plenty of whole grains, cereals, fruits and vegetables, exercise regularly and maintain ideal body weight.  Followup in the future with me in 6 months or call earlier if needed       Thank you for coming to see [place] at [facility]. I hope we have been able to provide you high quality care today.  You may receive a patient satisfaction survey over the next few weeks. We would appreciate your feedback and comments so that we may continue to improve ourselves and the health of our patients.

## 2018-03-02 NOTE — Progress Notes (Signed)
I agree with the above plan 

## 2018-03-06 DIAGNOSIS — I10 Essential (primary) hypertension: Secondary | ICD-10-CM | POA: Diagnosis not present

## 2018-03-06 DIAGNOSIS — R1013 Epigastric pain: Secondary | ICD-10-CM | POA: Diagnosis not present

## 2018-03-07 ENCOUNTER — Ambulatory Visit: Payer: 59 | Admitting: Cardiology

## 2018-03-07 ENCOUNTER — Encounter: Payer: Self-pay | Admitting: Cardiology

## 2018-03-07 VITALS — BP 144/80 | HR 92 | Ht 64.0 in | Wt 178.8 lb

## 2018-03-07 DIAGNOSIS — Z8249 Family history of ischemic heart disease and other diseases of the circulatory system: Secondary | ICD-10-CM | POA: Diagnosis not present

## 2018-03-07 DIAGNOSIS — I10 Essential (primary) hypertension: Secondary | ICD-10-CM

## 2018-03-07 DIAGNOSIS — E782 Mixed hyperlipidemia: Secondary | ICD-10-CM

## 2018-03-07 DIAGNOSIS — R079 Chest pain, unspecified: Secondary | ICD-10-CM | POA: Diagnosis not present

## 2018-03-07 MED FILL — ATORVASTATIN CALCIUM 20 MG: 20 | 90 days supply | Qty: 90 | Fill #0

## 2018-03-07 NOTE — Patient Instructions (Signed)
Medication Instructions:  Your physician recommends that you continue on your current medications as directed. Please refer to the Current Medication list given to you today.   If you need a refill on your cardiac medications before your next appointment, please call your pharmacy.   Lab work: None  If you have labs (blood work) drawn today and your tests are completely normal, you will receive your results only by: Marland Kitchen MyChart Message (if you have MyChart) OR . A paper copy in the mail If you have any lab test that is abnormal or we need to change your treatment, we will call you to review the results.  Testing/Procedures: None   Follow-Up: At [facility], you and your health needs are our priority.  As part of our continuing mission to provide you with exceptional heart care, we have created designated Provider Care Teams.  These Care Teams include your primary Cardiologist (physician) and Advanced Practice Providers (APPs -  Physician Assistants and Nurse Practitioners) who all work together to provide you with the care you need, when you need it. You will need a follow up appointment in 12 months.  Please call our office 2 months in advance to schedule this appointment.  You may see Dr. Johnsie Cancel or one of the following Advanced Practice Providers on your designated Care Team:   Truitt Merle, NP Cecilie Kicks, NP . Kathyrn Drown, NP  Any Other Special Instructions Will Be Listed Below (If Applicable).

## 2018-03-07 NOTE — Progress Notes (Signed)
Cardiology Office Note   Date:  03/07/2018   ID:  Stacey Mendoza, DOB 02-May-1966, MRN [id number]  PCP:  Aretta Nip, MD  Cardiologist:  Dr. Johnsie Cancel    Chief Complaint  Patient presents with  . Non-stress Test    results      History of Present Illness: Stacey Mendoza is a 52 y.o. female who presents for follow up cardiac CTA.   +FH of CAD and TIA, sister with MI at age 2, another sister with TIA.  Mother with hx stent.  Last seen by Dr. Johnsie Cancel 10/2015.  Was admitted 09/20/17 with hypertension at 180/100.  K+ was 2.9.  Troponin neg.  Also with weakness and numbness on Lt side.  Was seen by neuro -CT head unremarkable.  MRI of brain was normal.  CT angio of head and neck with patent carotid and vertebral arteries and no dissection.    Echo 09/21/17 with EF 60-65%   On last visit she had noted chest pain, mid sternal that occurs with exercise. No associated symptoms of N/V, SOB or diaphoresis.  She stops her activity and symptoms resolve.  With hospitalization in May she did have ST depression in inf and ant lat leads on EKG though troponins were neg.  Today EKG imrproved and more similar to EKG 2017.  The prior EKG changes could be related to TIA.   But with her family hx she is high risk for CAD.    Cardiac CTA was ordered and her calcium score was 0 and normal coronary arteries.    Today she feels great no chest pain and no SOB.  She has seen neurology and is on ASA and lipitor.  We reviewed cardiac CTA and questions were answered.    Past Medical History:  Diagnosis Date  . Abdominal pain, chronic, right upper quadrant 11/28/2013  . Abdominal pain, right lower quadrant 06/29/2013  . Anemia   . Bulging disc    cervical MRI pending  . Chest pain 09/21/2017  . Dyspepsia and other specified disorders of function of stomach 04/10/2013  . Elevated LFTs   . Family history of breast cancer in female 11/28/2013  . Hypertension   . Hypokalemia 09/21/2017  . Midepigastric  pain 10/23/2012  . Migraine headache 06/13/2013  . Nonspecific abnormal results of liver function study 06/29/2013  . Normal vaginal delivery 1992, 1986, 1990  . TIA (transient ischemic attack) 09/21/2017  . Unspecified constipation 10/23/2012    Past Surgical History:  Procedure Laterality Date  . CHOLECYSTECTOMY N/A 07/06/2017   Procedure: LAPAROSCOPIC CHOLECYSTECTOMY;  Surgeon: Kinsinger, Arta Bruce, MD;  Location: [facility]S;  Service: General;  Laterality: N/A;  . TUBAL LIGATION       Current Outpatient Medications  Medication Sig Dispense Refill  . amLODipine (NORVASC) 5 MG tablet Take 5 mg by mouth daily.    Marland Kitchen aspirin EC 81 MG EC tablet Take 1 tablet (81 mg total) by mouth daily. 30 tablet 0  . atorvastatin (LIPITOR) 20 MG tablet Take 1 tablet (20 mg total) by mouth daily at 6 PM. 90 tablet 3  . hydrochlorothiazide (MICROZIDE) 12.5 MG capsule Take 12.5 mg by mouth daily.  3   No current facility-administered medications for this visit.     Allergies:   Vicodin [hydrocodone-acetaminophen]    Social History:  The patient  reports that she has never smoked. She has never used smokeless tobacco. She reports that she does not drink alcohol or use drugs.  Family History:  The patient's family history includes Breast cancer (age of onset: 73) in her sister; Cancer in her sister; Cancer (age of onset: 43) in her other; Diabetes in her mother; Heart disease in her father and mother; Hypertension in her brother, mother, sister, sister, and sister; Throat cancer in her mother.    ROS:  General:no colds or fevers, no weight changes CV:see HPI PUL:see HPI GI:no diarrhea constipation or melena, no indigestion Endo:no diabetes, no thyroid disease  Wt Readings from Last 3 Encounters:  03/07/18 178 lb 12.8 oz (81.1 kg)  03/01/18 182 lb 12.8 oz (82.9 kg)  11/21/17 185 lb (83.9 kg)     PHYSICAL EXAM: VS:  BP (!) 144/80   Pulse 92   Ht 5\' 4"  (1.626 m)   Wt 178 lb 12.8 oz (81.1 kg)   SpO2  99%   BMI 30.69 kg/m  , BMI Body mass index is 30.69 kg/m. General:Pleasant affect, NAD Heart:S1S2 RRR without murmur, gallup, rub or click Lungs:clear without rales, rhonchi, or wheezes SEG:BTDV, non tender, + BS, do not palpate liver spleen or masses Neuro:alert and oriented, MAE, follows commands, + facial symmetry    EKG:  EKG is NOT ordered today.    Recent Labs: 09/20/2017: ALT 31; Hemoglobin 11.9; Platelets 223 09/21/2017: Magnesium 2.6 11/21/2017: BUN 11; Creatinine, Ser 0.87; Potassium 3.6; Sodium 137    Lipid Panel    Component Value Date/Time   CHOL 194 09/21/2017 0517   TRIG 64 09/21/2017 0517   HDL 59 09/21/2017 0517   CHOLHDL 3.3 09/21/2017 0517   VLDL 13 09/21/2017 0517   LDLCALC 122 (H) 09/21/2017 0517       Other studies Reviewed: Additional studies/ records that were reviewed today include: Cardiac CTA. FINDINGS: Limited view of the lung parenchyma demonstrates no suspicious nodularity. Airways are normal.  Limited view of the mediastinum demonstrates no adenopathy. Esophagus normal.  Limited view of the upper abdomen unremarkable.  Limited view of the skeleton and chest wall is unremarkable.  IMPRESSION: No significant extracardiac findings. FINDINGS: Non-cardiac: See separate report from [facility]. No significant findings on limited lung and soft tissue windows.  Calcium Score: No calcium detected  Coronary Arteries: Right dominant with no anomalies  LM: Normal  LAD: Normal  IM: Normal  D1: Normal  Circumflex: Normal  OM1: High take off normal  AV Groove: Normal  RCA: Normal  PDA: Normal  PLA: Normal  IMPRESSION: 1.  Normal right dominant coronary arteries  2.  Calcium score 0  3.  Normal aortic root 2.7 cm  Echo 09/21/17 Study Conclusions  - Left ventricle: The cavity size was normal. Wall thickness was normal. Systolic function was normal. The estimated ejection fraction was in  the range of 60% to 65%. Wall motion was normal; there were no regional wall motion abnormalities. Left ventricular diastolic function parameters were normal. - Aortic valve: Structurally normal valve. Trileaflet. Cusp separation was normal. Transvalvular velocity was minimally increased. There was no stenosis. There was no regurgitation. - Mitral valve: Mildly thickened leaflets . There was trivial regurgitation. - Left atrium: The atrium was normal in size. - Tricuspid valve: There was trivial regurgitation. - Pulmonary arteries: PA peak pressure: 23 mm Hg (S). - Inferior vena cava: The vessel was normal in size. The respirophasic diameter changes were in the normal range (= 50%), consistent with normal central venous pressure.  Impressions:  - LVEF 60-65%, normal wall thickness, normal wall motion, normal diastolic function, trivial MR, normal LA  size, trivial TR, RVSP 23 mmHg, normal IVC.   ASSESSMENT AND PLAN:  1.  Chest pain, non cardiac with normal cardiac CTA of chest. Reassured.  2.  Recent TIA followed by neuro  On ASA 81 mg daily. Seen by neuro on the 16th.   3.  HLD on statin continue  4.  HTN controlled instructed to call if BP > 229 systolic.  She checks at home and is usually 798 systolic.   5.  Premature FH CAD.       Current medicines are reviewed with the patient today.  The patient Has no concerns regarding medicines.  The following changes have been made:  See above Labs/ tests ordered today include:see above  Disposition:   FU:  see above  Signed, Cecilie Kicks, NP  03/07/2018 3:39 PM    [facility] [address], [place], [place], [place] Phone: [phone]; Fax: [phone]

## 2018-03-14 ENCOUNTER — Ambulatory Visit: Payer: 59 | Admitting: Obstetrics & Gynecology

## 2018-04-12 DIAGNOSIS — H524 Presbyopia: Secondary | ICD-10-CM | POA: Diagnosis not present

## 2018-04-27 ENCOUNTER — Other Ambulatory Visit: Payer: Self-pay | Admitting: Family Medicine

## 2018-04-27 DIAGNOSIS — Z1231 Encounter for screening mammogram for malignant neoplasm of breast: Secondary | ICD-10-CM

## 2018-05-16 ENCOUNTER — Ambulatory Visit: Payer: 59 | Admitting: Obstetrics & Gynecology

## 2018-05-24 MED FILL — HYDROCHLOROTHIAZIDE 12.5 MG: 12.5 | 90 days supply | Qty: 90 | Fill #1

## 2018-05-24 MED FILL — ATORVASTATIN CALCIUM 20 MG: 20 | 90 days supply | Qty: 90 | Fill #1

## 2018-05-24 MED FILL — AMLODIPINE BESYLATE 5 MG TA: 5 | 90 days supply | Qty: 90 | Fill #1

## 2018-05-29 DIAGNOSIS — K5901 Slow transit constipation: Secondary | ICD-10-CM | POA: Diagnosis not present

## 2018-05-29 DIAGNOSIS — Z1211 Encounter for screening for malignant neoplasm of colon: Secondary | ICD-10-CM | POA: Diagnosis not present

## 2018-05-29 DIAGNOSIS — D509 Iron deficiency anemia, unspecified: Secondary | ICD-10-CM | POA: Diagnosis not present

## 2018-06-06 MED FILL — PEG-3350 SOLUTION: 420 | 1 days supply | Qty: 4000 | Fill #0

## 2018-06-07 ENCOUNTER — Ambulatory Visit
Admission: RE | Admit: 2018-06-07 | Discharge: 2018-06-07 | Disposition: A | Discharge status: Home / Self Care | Payer: 59 | Source: Ambulatory Visit | Attending: Family Medicine | Admitting: Family Medicine

## 2018-06-07 DIAGNOSIS — Z1231 Encounter for screening mammogram for malignant neoplasm of breast: Secondary | ICD-10-CM | POA: Diagnosis not present

## 2018-06-12 DIAGNOSIS — K5901 Slow transit constipation: Secondary | ICD-10-CM | POA: Diagnosis not present

## 2018-06-12 DIAGNOSIS — D509 Iron deficiency anemia, unspecified: Secondary | ICD-10-CM | POA: Diagnosis not present

## 2018-06-12 DIAGNOSIS — K635 Polyp of colon: Secondary | ICD-10-CM | POA: Diagnosis not present

## 2018-06-12 MED FILL — LINZESS 290 MCG CAPSULE: 290 | 90 days supply | Qty: 90 | Fill #0

## 2018-07-06 ENCOUNTER — Ambulatory Visit: Payer: 59 | Admitting: Obstetrics & Gynecology

## 2018-07-12 ENCOUNTER — Ambulatory Visit (INDEPENDENT_AMBULATORY_CARE_PROVIDER_SITE_OTHER): Payer: 59 | Admitting: Obstetrics & Gynecology

## 2018-07-12 ENCOUNTER — Encounter: Payer: Self-pay | Admitting: Obstetrics & Gynecology

## 2018-07-12 VITALS — BP 152/88 | Ht 64.0 in | Wt 179.8 lb

## 2018-07-12 DIAGNOSIS — Z01419 Encounter for gynecological examination (general) (routine) without abnormal findings: Secondary | ICD-10-CM | POA: Diagnosis not present

## 2018-07-12 DIAGNOSIS — E6609 Other obesity due to excess calories: Secondary | ICD-10-CM | POA: Diagnosis not present

## 2018-07-12 DIAGNOSIS — Z6830 Body mass index (BMI) 30.0-30.9, adult: Secondary | ICD-10-CM | POA: Diagnosis not present

## 2018-07-12 DIAGNOSIS — Z789 Other specified health status: Secondary | ICD-10-CM

## 2018-07-12 NOTE — Patient Instructions (Signed)
1. Well female exam with routine gynecological exam Menstruating today, will f/u for the Pelvic exam with Pap/HPV HR testing.  Breasts normal.  Screening mammo 05/2018 negative.  Health labs with Fam MD.  Colonoscopy 06/2018 benign polyps per patient.  2. Use of condoms for contraception  3. Class 1 obesity due to excess calories without serious comorbidity with body mass index (BMI) of 30.0 to 30.9 in adult Low calorie/carb diet such as [employer] recommended.  Aerobic physical activities 5 times a week and weightlifting every 2 days.  Stacey Mendoza, it was a pleasure meeting you today!

## 2018-07-12 NOTE — Progress Notes (Signed)
Stacey Mendoza May 03, 1966 [id number]   History:    53 y.o. G3P3L3 Single  RP:  New (>3 yrs) patient presenting for annual gyn exam   HPI: Menstrual periods every month, stable heavy flow first 2 days.  No BTB.  No pelvic pain.  Uses condoms when sexually active.  Breasts normal.  BMI 30.86.  Plans to start exercising again.  Healthy nutrition per patient.  Health labs with Fam MD.  Past medical history,surgical history, family history and social history were all reviewed and documented in the EPIC chart.  Gynecologic History Patient's last menstrual period was 07/11/2018. Contraception: condoms Last Pap: 10/2010. Results were: normal Last mammogram: 05/2018. Results were: negative Bone Density: Never Colonoscopy: 06/2018 Benign Polyps per patient  Obstetric History OB History  Gravida Para Term Preterm AB Living  3 3       3   SAB TAB Ectopic Multiple Live Births               # Outcome Date GA Lbr Len/2nd Weight Sex Delivery Anes PTL Lv  3 Para           2 Para           1 Para              ROS: A ROS was performed and pertinent positives and negatives are included in the history.  GENERAL: No fevers or chills. HEENT: No change in vision, no earache, sore throat or sinus congestion. NECK: No pain or stiffness. CARDIOVASCULAR: No chest pain or pressure. No palpitations. PULMONARY: No shortness of breath, cough or wheeze. GASTROINTESTINAL: No abdominal pain, nausea, vomiting or diarrhea, melena or bright red blood per rectum. GENITOURINARY: No urinary frequency, urgency, hesitancy or dysuria. MUSCULOSKELETAL: No joint or muscle pain, no back pain, no recent trauma. DERMATOLOGIC: No rash, no itching, no lesions. ENDOCRINE: No polyuria, polydipsia, no heat or cold intolerance. No recent change in weight. HEMATOLOGICAL: No anemia or easy bruising or bleeding. NEUROLOGIC: No headache, seizures, numbness, tingling or weakness. PSYCHIATRIC: No depression, no loss of interest in normal  activity or change in sleep pattern.     Exam:   BP (!) 152/88 (BP Location: Right Arm, Patient Position: Sitting, Cuff Size: Large)   Ht 5\' 4"  (1.626 m)   Wt 179 lb 12.8 oz (81.6 kg)   LMP 07/11/2018 Comment: heavy bleeding with clots  BMI 30.86 kg/m   Body mass index is 30.86 kg/m.  General appearance : Well developed well nourished female. No acute distress HEENT: Eyes: no retinal hemorrhage or exudates,  Neck supple, trachea midline, no carotid bruits, no thyroidmegaly Lungs: Clear to auscultation, no rhonchi or wheezes, or rib retractions  Heart: Regular rate and rhythm, no murmurs or gallops Breast:Examined in sitting and supine position were symmetrical in appearance, no palpable masses or tenderness,  no skin retraction, no nipple inversion, no nipple discharge, no skin discoloration, no axillary or supraclavicular lymphadenopathy Abdomen: no palpable masses or tenderness, no rebound or guarding Extremities: no edema or skin discoloration or tenderness  Pelvic: Declined by patient because she is on her menstrual period.   Assessment/Plan:  53 y.o. female for annual exam   1. Well female exam with routine gynecological exam Menstruating today, will f/u for the Pelvic exam with Pap/HPV HR testing.  Breasts normal.  Screening mammo 05/2018 negative.  Health labs with Fam MD.  Colonoscopy 06/2018 benign polyps per patient.  2. Use of condoms for contraception  3. Class 1  obesity due to excess calories without serious comorbidity with body mass index (BMI) of 30.0 to 30.9 in adult Low calorie/carb diet such as [employer] recommended.  Aerobic physical activities 5 times a week and weightlifting every 2 days.   Princess Bruins MD, 12:34 PM 07/12/2018

## 2018-08-07 ENCOUNTER — Ambulatory Visit: Payer: 59 | Admitting: Obstetrics & Gynecology

## 2018-08-17 ENCOUNTER — Other Ambulatory Visit: Payer: Self-pay

## 2018-08-18 MED FILL — AMLODIPINE BESYLATE 5 MG TA: 5 | 90 days supply | Qty: 90 | Fill #0

## 2018-08-18 MED FILL — ATORVASTATIN 20 MG TABLET: 20 | 90 days supply | Qty: 90 | Fill #0

## 2018-08-18 MED FILL — HYDROCHLOROTHIAZIDE 12.5 MG: 12.5 | 90 days supply | Qty: 90 | Fill #0

## 2018-08-21 ENCOUNTER — Other Ambulatory Visit: Payer: Self-pay

## 2018-08-21 ENCOUNTER — Encounter: Payer: Self-pay | Admitting: Obstetrics & Gynecology

## 2018-08-21 ENCOUNTER — Ambulatory Visit: Payer: 59 | Admitting: Obstetrics & Gynecology

## 2018-08-21 VITALS — BP 134/84

## 2018-08-21 DIAGNOSIS — Z1151 Encounter for screening for human papillomavirus (HPV): Secondary | ICD-10-CM | POA: Diagnosis not present

## 2018-08-21 DIAGNOSIS — D219 Benign neoplasm of connective and other soft tissue, unspecified: Secondary | ICD-10-CM

## 2018-08-21 DIAGNOSIS — Z01419 Encounter for gynecological examination (general) (routine) without abnormal findings: Secondary | ICD-10-CM

## 2018-08-21 NOTE — Progress Notes (Signed)
    Stacey Mendoza 03-Mar-1966 [id number]        53 y.o.  G3P3L3 Single  RP: Completing Annual Gyn exam with Pap test/Gyn exam today  HPI: Menses regular normal every month.  No BTB.  No menopausal Sx.  Using condoms when sexually active.  Last IC 1 year ago.  No pelvic pain.  H/O Uterine Fibroids.     OB History  Gravida Para Term Preterm AB Living  3 3       3   SAB TAB Ectopic Multiple Live Births               # Outcome Date GA Lbr Len/2nd Weight Sex Delivery Anes PTL Lv  3 Para           2 Para           1 Para             Past medical history,surgical history, problem list, medications, allergies, family history and social history were all reviewed and documented in the EPIC chart.   Directed ROS with pertinent positives and negatives documented in the history of present illness/assessment and plan.  Exam:  Vitals:   08/21/18 0833  BP: 134/84   General appearance:  Normal  Abdomen: Normal  Gynecologic exam: Vulva normal.  Speculum:  Cervix/Vagina normal.  Pap/HPV HR done.  Bimanual exam:  Uterus AV, mildly increased in volume/nodular 9 cm/NT.  No adnexal mass/NT bilaterally.   Assessment/Plan:  53 y.o. G3P3   1. Encounter for gynecological examination with Papanicolaou smear of cervix Gynecologic exam with small uterine fibroids.  Pap test with high-risk HPV done today.  2. Fibroids Small asymptomatic uterine fibroids.  Patient's questions answered and reassured that with coming up menopause, the fibroids should progressively shrink.  Counseling on fibroids and menopause done.  Will observe.  Counseling on above issues and coordination of care more than 50% for 10 minutes.  Princess Bruins MD, 9:20 AM 08/21/2018

## 2018-08-21 NOTE — Patient Instructions (Signed)
1. Encounter for gynecological examination with Papanicolaou smear of cervix Gynecologic exam with small uterine fibroids.  Pap test with high-risk HPV done today.  2. Fibroids Small asymptomatic uterine fibroids.  Patient's questions answered and reassured that with coming up menopause, the fibroids should progressively shrink.  Counseling on fibroids and menopause done.  Will observe.  Stacey Mendoza, it was a pleasure seeing you today!  I will inform you of your results as soon as they are available.

## 2018-08-21 NOTE — Addendum Note (Signed)
Addended by: Thurnell Garbe A on: 08/21/2018 11:50 AM   Modules accepted: Orders

## 2018-08-22 ENCOUNTER — Telehealth: Payer: Self-pay | Admitting: Adult Health

## 2018-08-22 LAB — PAP, TP IMAGING W/ HPV RNA, RFLX HPV TYPE 16,18/45: HPV DNA High Risk: NOT DETECTED

## 2018-08-22 NOTE — Telephone Encounter (Addendum)
LVM to dsicuss changing appt to a video

## 2018-08-31 ENCOUNTER — Ambulatory Visit: Payer: 59 | Admitting: Adult Health

## 2018-08-31 NOTE — Telephone Encounter (Signed)
I called pt about her appt today with Janett Billow NP. I stated a vm was left last week. Pt stated she never checks her vm, and she was currently at work. Pt did not know she had an appt today. I explain due to COVID 19 we are only doing video visits. RN reviewed the chart and pt has had two visits at our office. I ask the pt if she has any new hospital visits for stroke. I reviewed the chart and did not see any hospitals since her first stroke. PT states she is feeling fine and having no problems. I advise pt her appt can be cancel and she can follow up as needed at our office. I advised pt that JEssica NP last prescribed her lipitor and her PCP will need to manage ongoing for refills. Pt verbalized understanding.

## 2018-10-18 DIAGNOSIS — R05 Cough: Secondary | ICD-10-CM | POA: Diagnosis not present

## 2018-10-18 DIAGNOSIS — Z20828 Contact with and (suspected) exposure to other viral communicable diseases: Secondary | ICD-10-CM | POA: Diagnosis not present

## 2018-10-20 MED FILL — HYDROCODONE-HOMATROPINE SOL: 5-1.5 | 12 days supply | Qty: 120 | Fill #0

## 2018-10-27 MED FILL — BENZONATATE 100 MG CAPS: 100 | 10 days supply | Qty: 30 | Fill #0

## 2018-11-10 DIAGNOSIS — U071 COVID-19: Secondary | ICD-10-CM | POA: Diagnosis not present

## 2018-11-13 MED FILL — BENZONATATE 100 MG CAPS: 100 | 10 days supply | Qty: 30 | Fill #0

## 2018-11-13 MED FILL — ALBUTEROL SULFATE HFA 108 (: 108 (90 BAS | 17 days supply | Qty: 9 | Fill #0

## 2018-11-20 ENCOUNTER — Other Ambulatory Visit: Payer: Self-pay | Admitting: Adult Health

## 2018-11-20 MED FILL — HYDROCHLOROTHIAZIDE 12.5 MG: 12.5 | 90 days supply | Qty: 90 | Fill #0

## 2018-11-20 MED FILL — AMLODIPINE BESYLATE 5 MG TA: 5 | 90 days supply | Qty: 90 | Fill #0

## 2018-11-23 MED FILL — ATORVASTATIN 20 MG TABLET: 20 | 90 days supply | Qty: 90 | Fill #0

## 2019-01-17 MED FILL — LINZESS 290 MCG CAPSULE: 290 | 90 days supply | Qty: 90 | Fill #1

## 2019-02-27 MED FILL — AMLODIPINE BESYLATE 5 MG TA: 5 | 30 days supply | Qty: 30 | Fill #0

## 2019-02-27 MED FILL — HYDROCHLOROTHIAZIDE 12.5 MG: 12.5 | 30 days supply | Qty: 30 | Fill #0

## 2019-02-27 MED FILL — ATORVASTATIN 20 MG TABLET: 20 | 30 days supply | Qty: 30 | Fill #0

## 2019-02-27 MED FILL — LINZESS 290 MCG CAPSULE: 290 | 90 days supply | Qty: 90 | Fill #1

## 2019-02-28 DIAGNOSIS — E7800 Pure hypercholesterolemia, unspecified: Secondary | ICD-10-CM | POA: Diagnosis not present

## 2019-02-28 DIAGNOSIS — Z8619 Personal history of other infectious and parasitic diseases: Secondary | ICD-10-CM | POA: Diagnosis not present

## 2019-02-28 DIAGNOSIS — R05 Cough: Secondary | ICD-10-CM | POA: Diagnosis not present

## 2019-02-28 DIAGNOSIS — I10 Essential (primary) hypertension: Secondary | ICD-10-CM | POA: Diagnosis not present

## 2019-02-28 MED FILL — ADVAIR 250/50 DISKUS: 250-50 | 30 days supply | Qty: 60 | Fill #0

## 2019-02-28 MED FILL — ALBUTEROL SULFATE HFA 108 (: 108 (90 BAS | 25 days supply | Qty: 9 | Fill #0

## 2019-02-28 MED FILL — HYDROCHLOROTHIAZIDE 12.5 MG: 12.5 | 90 days supply | Qty: 90 | Fill #0

## 2019-02-28 MED FILL — BENZONATATE 100 MG CAPS: 100 | 10 days supply | Qty: 30 | Fill #0

## 2019-03-08 NOTE — Progress Notes (Signed)
Cardiology Office Note   Date:  03/12/2019   ID:  Stacey Mendoza, DOB 1966/02/12, MRN [id number]  PCP:  Aretta Nip, MD  Cardiologist:  Dr. Johnsie Cancel    No chief complaint on file.     History of Present Illness: Stacey Mendoza is a 53 y.o. female who presents for follow up cardiac CTA.   +FH of CAD and TIA, sister with MI at age 84, another sister with TIA.  Mother with hx stent.  Last seen by Dr. Johnsie Cancel 10/2015.  Was admitted 09/20/17 with hypertension at 180/100.  K+ was 2.9.  Troponin neg.  Also with weakness and numbness on Lt side.  Was seen by neuro -CT head unremarkable.  MRI of brain was normal.  CT angio of head and neck with patent carotid and vertebral arteries and no dissection.    Echo 09/21/17 with EF 60-65%   On last visit she had noted chest pain, mid sternal that occurs with exercise. No associated symptoms of N/V, SOB or diaphoresis.  She stops her activity and symptoms resolve.  With hospitalization in May she did have ST depression in inf and ant lat leads on EKG though troponins were neg.  Today EKG imrproved and more similar to EKG 2017.  The prior EKG changes could be related to TIA.   But with her family hx she is high risk for CAD.    Cardiac CTA was ordered and her calcium score was 0 and normal coronary arteries.  12/19/17  Today she feels great no chest pain and no SOB.  She has seen neurology and is on ASA and lipitor.  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 Abdominal pain, chronic, right upper quadrant 11/28/2013  . Abdominal pain, right lower quadrant 06/29/2013  . Anemia   . Bulging disc    cervical MRI pending  . Chest pain 09/21/2017  . Dyspepsia and other specified disorders of function of stomach 04/10/2013  . Elevated LFTs   . Family history of breast cancer in female  11/28/2013  . Hypertension   . Hypokalemia 09/21/2017  . Midepigastric pain 10/23/2012  . Migraine headache 06/13/2013  . Nonspecific abnormal results of liver function study 06/29/2013  . Normal vaginal delivery 1992, 1986, 1990  . TIA (transient ischemic attack) 09/21/2017  . Unspecified constipation 10/23/2012    Past Surgical History:  Procedure Laterality Date  . CHOLECYSTECTOMY N/A 07/06/2017   Procedure: LAPAROSCOPIC CHOLECYSTECTOMY;  Surgeon: Kinsinger, Arta Bruce, MD;  Location: [facility];  Service: General;  Laterality: N/A;  . TUBAL LIGATION       Current Outpatient Medications  Medication Sig Dispense Refill  . amLODipine (NORVASC) 5 MG tablet Take 5 mg by mouth daily.    Marland Kitchen aspirin EC 81 MG EC tablet Take 1 tablet (81 mg total) by mouth daily. 30 tablet 0  . atorvastatin (LIPITOR) 20 MG tablet Take 1 tablet (20 mg total) by mouth daily at 6 PM. 90 tablet 3  . hydrochlorothiazide (MICROZIDE) 12.5 MG capsule Take 12.5 mg by mouth daily.  3  . LINZESS 290 MCG CAPS capsule      No current facility-administered medications for this visit.     Allergies:   Vicodin [hydrocodone-acetaminophen]    Social History:  The patient  reports  that she has never smoked. She has never used smokeless tobacco. She reports that she does not drink alcohol or use drugs.   Family History:  The patient's family history includes Breast cancer (age of onset: 45) in her sister; Cancer in her sister; Cancer (age of onset: 67) in an other family member; Diabetes in her mother; Heart disease in her father and mother; Hypertension in her brother, mother, sister, sister, and sister; Throat cancer in her mother.    ROS:  General:no colds or fevers, no weight changes CV:see HPI PUL:see HPI GI:no diarrhea constipation or melena, no indigestion Endo:no diabetes, no thyroid disease  Wt Readings from Last 3 Encounters:  03/12/19 169 lb (76.7 kg)  07/12/18 179 lb 12.8 oz (81.6 kg)  03/07/18 178 lb 12.8 oz (81.1  kg)     PHYSICAL EXAM: VS:  BP 138/84   Pulse 93   Ht 5\' 4"  (1.626 m)   Wt 169 lb (76.7 kg)   SpO2 99%   BMI 29.01 kg/m  , BMI   Affect appropriate Healthy:  appears stated age HEENT: normal Neck supple with no adenopathy JVP normal no bruits no thyromegaly Lungs clear with no wheezing and good diaphragmatic motion Heart:  S1/S2 no murmur, no rub, gallop or click PMI normal Abdomen: benighn, BS positve, no tenderness, no AAA no bruit.  No HSM or HJR Distal pulses intact with no bruits No edema Neuro non-focal Skin warm and dry No muscular weakness     EKG:  11/21/17 NSR 78 normal ECG 03/12/19 SR rate 93 nonspecific ST/T Wave changes    Recent Labs: No results found for requested labs within last 8760 hours.    Lipid Panel    Component Value Date/Time   CHOL 194 09/21/2017 0517   TRIG 64 09/21/2017 0517   HDL 59 09/21/2017 0517   CHOLHDL 3.3 09/21/2017 0517   VLDL 13 09/21/2017 0517   LDLCALC 122 (H) 09/21/2017 0517       Other studies Reviewed: Additional studies/ records that were reviewed today include: Cardiac CTA. FINDINGS: Limited view of the lung parenchyma demonstrates no suspicious nodularity. Airways are normal.  Limited view of the mediastinum demonstrates no adenopathy. Esophagus normal.  Limited view of the upper abdomen unremarkable.  Limited view of the skeleton and chest wall is unremarkable.  IMPRESSION: No significant extracardiac findings. FINDINGS: Non-cardiac: See separate report from [facility]. No significant findings on limited lung and soft tissue windows.  Calcium Score: No calcium detected  Coronary Arteries: Right dominant with no anomalies  LM: Normal  LAD: Normal  IM: Normal  D1: Normal  Circumflex: Normal  OM1: High take off normal  AV Groove: Normal  RCA: Normal  PDA: Normal  PLA: Normal  IMPRESSION: 1.  Normal right dominant coronary arteries  2.  Calcium score 0   3.  Normal aortic root 2.7 cm  Echo 09/21/17 Study Conclusions  - Left ventricle: The cavity size was normal. Wall thickness was normal. Systolic function was normal. The estimated ejection fraction was in the range of 60% to 65%. Wall motion was normal; there were no regional wall motion abnormalities. Left ventricular diastolic function parameters were normal. - Aortic valve: Structurally normal valve. Trileaflet. Cusp separation was normal. Transvalvular velocity was minimally increased. There was no stenosis. There was no regurgitation. - Mitral valve: Mildly thickened leaflets . There was trivial regurgitation. - Left atrium: The atrium was normal in size. - Tricuspid valve: There was trivial regurgitation. - Pulmonary arteries:  PA peak pressure: 23 mm Hg (S). - Inferior vena cava: The vessel was normal in size. The respirophasic diameter changes were in the normal range (= 50%), consistent with normal central venous pressure.  Impressions:  - LVEF 60-65%, normal wall thickness, normal wall motion, normal diastolic function, trivial MR, normal LA size, trivial TR, RVSP 23 mmHg, normal IVC.   ASSESSMENT AND PLAN:  1.  Chest pain, non cardiac with normal cardiac CTA 12/19/17  of chest. Reassured. Also had normal stress echo in 2017   2.  TIA followed by neuro  On ASA 81 mg daily. May 2019 not clear she had stroke history of headaches/ migraine MRI was normal normal CTA head and neck as well   3.  HLD on statin continue  4.  HTN controlled instructed to call if BP > [id number] systolic.  She checks at home and is usually [id number] systolic.   5.  Premature FH CAD.       Current medicines are reviewed with the patient today.  The patient Has no concerns regarding medicines.  The following changes have been made:  See above Labs/ tests ordered today include:see above  Disposition:   FU:  see above  Signed, Jenkins Rouge, MD  03/12/2019 9:16 AM     [facility] [place], [place], [place], [place] Phone: [phone]; Fax: [phone]

## 2019-03-12 ENCOUNTER — Other Ambulatory Visit: Payer: Self-pay

## 2019-03-12 ENCOUNTER — Ambulatory Visit: Payer: 59 | Admitting: Cardiovascular Disease

## 2019-03-12 ENCOUNTER — Encounter: Payer: Self-pay | Admitting: Cardiovascular Disease

## 2019-03-12 VITALS — BP 138/84 | HR 93 | Ht 64.0 in | Wt 169.0 lb

## 2019-03-12 DIAGNOSIS — R079 Chest pain, unspecified: Secondary | ICD-10-CM

## 2019-03-12 DIAGNOSIS — Z8249 Family history of ischemic heart disease and other diseases of the circulatory system: Secondary | ICD-10-CM | POA: Diagnosis not present

## 2019-03-12 NOTE — Patient Instructions (Signed)

## 2019-03-28 DIAGNOSIS — I10 Essential (primary) hypertension: Secondary | ICD-10-CM | POA: Diagnosis not present

## 2019-03-28 DIAGNOSIS — J309 Allergic rhinitis, unspecified: Secondary | ICD-10-CM | POA: Diagnosis not present

## 2019-03-28 DIAGNOSIS — E7800 Pure hypercholesterolemia, unspecified: Secondary | ICD-10-CM | POA: Diagnosis not present

## 2019-03-28 MED FILL — predniSONE 10 MG TABS: 10 | 6 days supply | Qty: 21 | Fill #0

## 2019-03-28 MED FILL — BENZONATATE 100 MG CAPS: 100 | 30 days supply | Qty: 90 | Fill #0

## 2019-03-28 MED FILL — AMLODIPINE BESYLATE 10 MG T: 10 | 90 days supply | Qty: 90 | Fill #0

## 2019-04-30 ENCOUNTER — Other Ambulatory Visit: Payer: Self-pay | Admitting: Family Medicine

## 2019-04-30 DIAGNOSIS — Z1231 Encounter for screening mammogram for malignant neoplasm of breast: Secondary | ICD-10-CM

## 2019-06-15 ENCOUNTER — Other Ambulatory Visit: Payer: Self-pay

## 2019-06-15 ENCOUNTER — Ambulatory Visit
Admission: RE | Admit: 2019-06-15 | Discharge: 2019-06-15 | Disposition: A | Discharge status: Home / Self Care | Payer: 59 | Source: Ambulatory Visit | Attending: Family Medicine | Admitting: Family Medicine

## 2019-06-15 DIAGNOSIS — Z1231 Encounter for screening mammogram for malignant neoplasm of breast: Secondary | ICD-10-CM

## 2019-07-02 MED FILL — AMLODIPINE BESYLATE 10 MG T: 10 | 90 days supply | Qty: 90 | Fill #1

## 2019-07-02 MED FILL — ATORVASTATIN 20 MG TABLET: 20 | 90 days supply | Qty: 90 | Fill #0

## 2019-07-02 MED FILL — ALBUTEROL SULFATE HFA 108 (: 108 (90 BAS | 25 days supply | Qty: 9 | Fill #1

## 2019-07-02 MED FILL — HYDROCHLOROTHIAZIDE 12.5 MG: 12.5 | 30 days supply | Qty: 30 | Fill #0

## 2019-08-09 ENCOUNTER — Other Ambulatory Visit: Payer: Self-pay

## 2019-08-09 ENCOUNTER — Ambulatory Visit (INDEPENDENT_AMBULATORY_CARE_PROVIDER_SITE_OTHER): Payer: 59 | Admitting: Obstetrics & Gynecology

## 2019-08-09 ENCOUNTER — Encounter: Payer: Self-pay | Admitting: Obstetrics & Gynecology

## 2019-08-09 VITALS — BP 128/80 | Ht 65.0 in | Wt 173.0 lb

## 2019-08-09 DIAGNOSIS — D219 Benign neoplasm of connective and other soft tissue, unspecified: Secondary | ICD-10-CM

## 2019-08-09 DIAGNOSIS — N946 Dysmenorrhea, unspecified: Secondary | ICD-10-CM | POA: Diagnosis not present

## 2019-08-09 DIAGNOSIS — Z01419 Encounter for gynecological examination (general) (routine) without abnormal findings: Secondary | ICD-10-CM

## 2019-08-09 DIAGNOSIS — Z789 Other specified health status: Secondary | ICD-10-CM | POA: Diagnosis not present

## 2019-08-09 DIAGNOSIS — N914 Secondary oligomenorrhea: Secondary | ICD-10-CM

## 2019-08-09 MED ORDER — IBUPROFEN 800 MG PO TABS: 800.0000 mg | ORAL_TABLET | Freq: Three times a day (TID) | ORAL | 3 refills | Status: DC | PRN

## 2019-08-09 MED FILL — IBUPROFEN 800 MG TABS: 800 | 10 days supply | Qty: 30 | Fill #0

## 2019-08-09 NOTE — Patient Instructions (Signed)
1. Encounter for gynecological examination with Papanicolaou smear of cervix Normal gynecologic exam.  Pap reflex done.  Breast exam normal.  Screening mammogram January 2021 was negative.  Colonoscopy 2020.  Health labs with family physician.  Improved body mass index since last year.  Continue with a slightly lower calorie/carb diet.  Continue with aerobic activities 5 times a week and light weightlifting every 2 days.  2. Use of condoms for contraception  3. Fibroids History of uterine fibroids.  Oligo menorrhagia with severe dysmenorrhea recently.  Patient will follow-up for a pelvic ultrasound to further investigate. - US Transvaginal Non-OB; Future  4. Secondary oligomenorrhea Heavy menstrual periods after spacing for 3 to 4 months.  Will investigate by ultrasound and decide on any treatment for cycle control per results. - US Transvaginal Non-OB; Future  5. Dysmenorrhea Prescription for ibuprofen sent to pharmacy, patient will use as needed.  Other orders - amLODipine (NORVASC) 10 MG tablet; Take 10 mg by mouth daily. - ibuprofen (ADVIL) 800 MG tablet; Take 1 tablet (800 mg total) by mouth every 8 (eight) hours as needed.  Romie Minus, it was a pleasure seeing you today!  I will inform you of your results as soon as they are available.

## 2019-08-09 NOTE — Addendum Note (Signed)
Addended by: Gae Gallop T on: 08/09/2019 09:02 AM   Modules accepted: Orders

## 2019-08-09 NOTE — Progress Notes (Signed)
Stacey Mendoza Apr 21, 1966 [id number]   History:    54 y.o. G3P3L3 Single/Boyfriend  RP: Established patient presenting for Annual/Gynecologic Exam  HPI:  Menses every 1-4 months with very heavy flow and dysmenorrhea after spacing for 3-4 months.  No BTB.  No menopausal Sx.  Using condoms when sexually active.  Last IC 1 month ago.  No pelvic pain.  H/O Uterine Fibroids. Urine/BMs normal.  Breasts normal.  BMI 28.79.  Good fitness.  Colonoscopy done. Health labs with Fam MD.  Past medical history,surgical history, family history and social history were all reviewed and documented in the EPIC chart.  Gynecologic History Patient's last menstrual period was 07/31/2019 (lmp unknown).  Obstetric History OB History  Gravida Para Term Preterm AB Living  3 3   3   3   SAB TAB Ectopic Multiple Live Births               # Outcome Date GA Lbr Len/2nd Weight Sex Delivery Anes PTL Lv  3 Preterm           2 Preterm           1 Preterm              ROS: A ROS was performed and pertinent positives and negatives are included in the history.  GENERAL: No fevers or chills. HEENT: No change in vision, no earache, sore throat or sinus congestion. NECK: No pain or stiffness. CARDIOVASCULAR: No chest pain or pressure. No palpitations. PULMONARY: No shortness of breath, cough or wheeze. GASTROINTESTINAL: No abdominal pain, nausea, vomiting or diarrhea, melena or bright red blood per rectum. GENITOURINARY: No urinary frequency, urgency, hesitancy or dysuria. MUSCULOSKELETAL: No joint or muscle pain, no back pain, no recent trauma. DERMATOLOGIC: No rash, no itching, no lesions. ENDOCRINE: No polyuria, polydipsia, no heat or cold intolerance. No recent change in weight. HEMATOLOGICAL: No anemia or easy bruising or bleeding. NEUROLOGIC: No headache, seizures, numbness, tingling or weakness. PSYCHIATRIC: No depression, no loss of interest in normal activity or change in sleep pattern.     Exam:   BP  128/80 (BP Location: Right Arm, Patient Position: Sitting, Cuff Size: Normal)   Ht 5\' 5"  (1.651 m)   Wt 173 lb (78.5 kg)   LMP 07/31/2019 (LMP Unknown)   BMI 28.79 kg/m   Body mass index is 28.79 kg/m.  General appearance : Well developed well nourished female. No acute distress HEENT: Eyes: no retinal hemorrhage or exudates,  Neck supple, trachea midline, no carotid bruits, no thyroidmegaly Lungs: Clear to auscultation, no rhonchi or wheezes, or rib retractions  Heart: Regular rate and rhythm, no murmurs or gallops Breast:Examined in sitting and supine position were symmetrical in appearance, no palpable masses or tenderness,  no skin retraction, no nipple inversion, no nipple discharge, no skin discoloration, no axillary or supraclavicular lymphadenopathy Abdomen: no palpable masses or tenderness, no rebound or guarding Extremities: no edema or skin discoloration or tenderness  Pelvic: Vulva: Normal             Vagina: No gross lesions or discharge  Cervix: No gross lesions or discharge.  Pap reflex done.  Uterus  AV, normal size, shape and consistency, non-tender and mobile  Adnexa  Without masses or tenderness  Anus: Normal   Assessment/Plan:  54 y.o. female for annual exam   1. Encounter for gynecological examination with Papanicolaou smear of cervix Normal gynecologic exam.  Pap reflex done.  Breast exam normal.  Screening mammogram January  2021 was negative.  Colonoscopy 2020.  Health labs with family physician.  Improved body mass index since last year.  Continue with a slightly lower calorie/carb diet.  Continue with aerobic activities 5 times a week and light weightlifting every 2 days.  2. Use of condoms for contraception  3. Fibroids History of uterine fibroids.  Oligo menorrhagia with severe dysmenorrhea recently.  Patient will follow-up for a pelvic ultrasound to further investigate. - US Transvaginal Non-OB; Future  4. Secondary oligomenorrhea Heavy menstrual  periods after spacing for 3 to 4 months.  Will investigate by ultrasound and decide on any treatment for cycle control per results. - US Transvaginal Non-OB; Future  5. Dysmenorrhea Prescription for ibuprofen sent to pharmacy, patient will use as needed.  Other orders - amLODipine (NORVASC) 10 MG tablet; Take 10 mg by mouth daily. - ibuprofen (ADVIL) 800 MG tablet; Take 1 tablet (800 mg total) by mouth every 8 (eight) hours as needed.  Princess Bruins MD, 8:29 AM 08/09/2019

## 2019-08-10 LAB — PAP IG W/ RFLX HPV ASCU

## 2019-08-30 ENCOUNTER — Ambulatory Visit: Payer: 59 | Admitting: Obstetrics & Gynecology

## 2019-08-30 ENCOUNTER — Other Ambulatory Visit: Payer: 59

## 2019-09-17 DIAGNOSIS — J309 Allergic rhinitis, unspecified: Secondary | ICD-10-CM | POA: Diagnosis not present

## 2019-09-17 DIAGNOSIS — I10 Essential (primary) hypertension: Secondary | ICD-10-CM | POA: Diagnosis not present

## 2019-09-18 MED FILL — ADVAIR 250/50 DISKUS: 250-50 | 90 days supply | Qty: 180 | Fill #0

## 2019-09-18 MED FILL — HYDROCHLOROTHIAZIDE 12.5 MG: 12.5 | 45 days supply | Qty: 45 | Fill #0

## 2019-10-30 MED FILL — IBUPROFEN 800 MG TABS: 800 | 10 days supply | Qty: 30 | Fill #1

## 2019-10-30 MED FILL — AMOXICILLIN 500 MG CAPSULE: 500 | 8 days supply | Qty: 24 | Fill #0

## 2019-10-30 MED FILL — HYDROCHLOROTHIAZIDE 12.5 MG: 12.5 | 45 days supply | Qty: 45 | Fill #1

## 2019-10-30 MED FILL — ALBUTEROL SULFATE HFA 108 (: 108 (90 BAS | 25 days supply | Qty: 9 | Fill #2

## 2019-10-30 MED FILL — ATORVASTATIN 20 MG TABLET: 20 | 90 days supply | Qty: 90 | Fill #1

## 2019-10-30 MED FILL — AMLODIPINE BESYLATE 10 MG T: 10 | 90 days supply | Qty: 90 | Fill #0

## 2019-12-21 MED FILL — LINZESS 290 MCG CAPSULE: 290 | 90 days supply | Qty: 90 | Fill #0

## 2019-12-24 MED FILL — HYDROCHLOROTHIAZIDE 12.5 MG: 12.5 | 90 days supply | Qty: 90 | Fill #0

## 2019-12-24 MED FILL — IBUPROFEN 800 MG TABS: 800 | 10 days supply | Qty: 30 | Fill #2

## 2019-12-25 MED FILL — ALBUTEROL SULFATE HFA 108 (: 108 (90 BAS | 25 days supply | Qty: 9 | Fill #3

## 2020-01-02 DIAGNOSIS — R05 Cough: Secondary | ICD-10-CM | POA: Diagnosis not present

## 2020-01-02 MED FILL — HYDROCODONE-HOMATROPINE SOL: 5-1.5 | 10 days supply | Qty: 50 | Fill #0

## 2020-01-02 MED FILL — BENZONATATE 100 MG CAPS: 100 | 10 days supply | Qty: 30 | Fill #0

## 2020-01-10 DIAGNOSIS — J40 Bronchitis, not specified as acute or chronic: Secondary | ICD-10-CM | POA: Diagnosis not present

## 2020-01-10 MED FILL — HYDROCODONE-HOMATROPINE SOL: 5-1.5 | 10 days supply | Qty: 50 | Fill #0

## 2020-01-10 MED FILL — predniSONE 10 MG TABS: 10 | 6 days supply | Qty: 21 | Fill #0

## 2020-01-10 MED FILL — BENZONATATE 100 MG CAPS: 100 | 10 days supply | Qty: 30 | Fill #0

## 2020-02-11 MED FILL — IBUPROFEN 800 MG TABS: 800 | 10 days supply | Qty: 30 | Fill #3

## 2020-02-11 MED FILL — AMLODIPINE BESYLATE 10 MG T: 10 | 90 days supply | Qty: 90 | Fill #1

## 2020-02-11 MED FILL — ALBUTEROL SULFATE HFA 108 (: 108 (90 BAS | 25 days supply | Qty: 9 | Fill #4

## 2020-02-27 DIAGNOSIS — K5901 Slow transit constipation: Secondary | ICD-10-CM | POA: Diagnosis not present

## 2020-02-27 DIAGNOSIS — K2100 Gastro-esophageal reflux disease with esophagitis, without bleeding: Secondary | ICD-10-CM | POA: Diagnosis not present

## 2020-03-14 MED FILL — LUBIPROSTONE 24 MCG CAPS: 24 | 30 days supply | Qty: 60 | Fill #0

## 2020-03-17 NOTE — Progress Notes (Signed)
Cardiology Office Note   Date:  03/24/2020   ID:  Stacey Mendoza, DOB 1965/07/05, MRN [id number]  PCP:  Aretta Nip, MD  Cardiologist:  Dr. Johnsie Cancel    No chief complaint on file.     History of Present Illness: Stacey Mendoza is a 54 y.o. female who presents for follow up cardiac CTA. +FH of CAD and TIA, sister with MI at age 74, another sister with TIA.  Mother with hx stent.    Was admitted 09/20/17 with hypertension at 180/100.  K+ was 2.9.  Troponin neg.  Also with weakness and numbness on Lt side.  Was seen by neuro -CT head unremarkable.  MRI of brain was normal.  CT angio of head and neck with patent carotid and vertebral arteries and no dissection.    Echo 09/21/17 with EF 60-65%    12/19/17 Cardiac CTA done  and her calcium score was 0 and normal coronary arteries.     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 Abdominal pain, chronic, right upper quadrant 11/28/2013  . Abdominal pain, right lower quadrant 06/29/2013  . Anemia   . Bulging disc    cervical MRI pending  . Chest pain 09/21/2017  . Dyspepsia and other specified disorders of function of stomach 04/10/2013  . Elevated LFTs   . Family history of breast cancer in female 11/28/2013  . Hypertension   . Hypokalemia 09/21/2017  . Midepigastric pain 10/23/2012  . Migraine headache 06/13/2013  . Nonspecific abnormal results of liver function study 06/29/2013  . Normal vaginal delivery 1992, 1986, 1990  . TIA (transient ischemic attack) 09/21/2017  . Unspecified constipation 10/23/2012    Past Surgical History:  Procedure Laterality Date  . CHOLECYSTECTOMY N/A 07/06/2017   Procedure: LAPAROSCOPIC CHOLECYSTECTOMY;  Surgeon: Kinsinger, Arta Bruce, MD;   Location: [facility];  Service: General;  Laterality: N/A;  . TUBAL LIGATION       Current Outpatient Medications  Medication Sig Dispense Refill  . amLODipine (NORVASC) 10 MG tablet Take 10 mg by mouth daily.    Marland Kitchen aspirin EC 81 MG EC tablet Take 1 tablet (81 mg total) by mouth daily. 30 tablet 0  . atorvastatin (LIPITOR) 20 MG tablet Take 1 tablet (20 mg total) by mouth daily at 6 PM. 90 tablet 3  . hydrochlorothiazide (MICROZIDE) 12.5 MG capsule Take 12.5 mg by mouth daily.  3  . ibuprofen (ADVIL) 800 MG tablet Take 1 tablet (800 mg total) by mouth every 8 (eight) hours as needed. 30 tablet 3  . LINZESS 290 MCG CAPS capsule      No current facility-administered medications for this visit.    Allergies:   Vicodin [hydrocodone-acetaminophen]    Social History:  The patient  reports that she has never smoked. She has never used smokeless tobacco. She reports that she does not drink alcohol and does not use drugs.   Family History:  The patient's family history includes Breast cancer (age of onset: 60) in her sister; Cancer in her sister; Cancer (age of onset: 63) in an other family member; Diabetes in her mother; Heart disease in her father and mother; Hypertension in her brother,  mother, sister, sister, and sister; Throat cancer in her mother.    ROS:  General:no colds or fevers, no weight changes CV:see HPI PUL:see HPI GI:no diarrhea constipation or melena, no indigestion Endo:no diabetes, no thyroid disease  Wt Readings from Last 3 Encounters:  03/24/20 77.6 kg  08/09/19 78.5 kg  03/12/19 76.7 kg     PHYSICAL EXAM: VS:  BP 128/78   Pulse 78   Ht 5\' 4"  (1.626 m)   Wt 77.6 kg   SpO2 100%   BMI 29.35 kg/m  , BMI   Affect appropriate Healthy:  appears stated age 87: normal Neck supple with no adenopathy JVP normal no bruits no thyromegaly Lungs clear with no wheezing and good diaphragmatic motion Heart:  S1/S2 no murmur, no rub, gallop or click PMI normal Abdomen:  benighn, BS positve, no tenderness, no AAA no bruit.  No HSM or HJR Distal pulses intact with no bruits No edema Neuro non-focal Skin warm and dry No muscular weakness     EKG:  11/8/21SR rate 78 nonspecific ST/T Wave changes    Recent Labs: No results found for requested labs within last 8760 hours.    Lipid Panel    Component Value Date/Time   CHOL 194 09/21/2017 0517   TRIG 64 09/21/2017 0517   HDL 59 09/21/2017 0517   CHOLHDL 3.3 09/21/2017 0517   VLDL 13 09/21/2017 0517   LDLCALC 122 (H) 09/21/2017 0517       Other studies Reviewed: Additional studies/ records that were reviewed today include: Cardiac CTA. FINDINGS: Limited view of the lung parenchyma demonstrates no suspicious nodularity. Airways are normal.  Limited view of the mediastinum demonstrates no adenopathy. Esophagus normal.  Limited view of the upper abdomen unremarkable.  Limited view of the skeleton and chest wall is unremarkable.  IMPRESSION: No significant extracardiac findings. FINDINGS: Non-cardiac: See separate report from [facility] Radiology. No significant findings on limited lung and soft tissue windows.  Calcium Score: No calcium detected  Coronary Arteries: Right dominant with no anomalies  LM: Normal  LAD: Normal  IM: Normal  D1: Normal  Circumflex: Normal  OM1: High take off normal  AV Groove: Normal  RCA: Normal  PDA: Normal  PLA: Normal  IMPRESSION: 1.  Normal right dominant coronary arteries  2.  Calcium score 0  3.  Normal aortic root 2.7 cm  Echo 09/21/17 Study Conclusions  - Left ventricle: The cavity size was normal. Wall thickness was normal. Systolic function was normal. The estimated ejection fraction was in the range of 60% to 65%. Wall motion was normal; there were no regional wall motion abnormalities. Left ventricular diastolic function parameters were normal. - Aortic valve: Structurally normal valve.  Trileaflet. Cusp separation was normal. Transvalvular velocity was minimally increased. There was no stenosis. There was no regurgitation. - Mitral valve: Mildly thickened leaflets . There was trivial regurgitation. - Left atrium: The atrium was normal in size. - Tricuspid valve: There was trivial regurgitation. - Pulmonary arteries: PA peak pressure: 23 mm Hg (S). - Inferior vena cava: The vessel was normal in size. The respirophasic diameter changes were in the normal range (= 50%), consistent with normal central venous pressure.  Impressions:  - LVEF 60-65%, normal wall thickness, normal wall motion, normal diastolic function, trivial MR, normal LA size, trivial TR, RVSP 23 mmHg, normal IVC.   ASSESSMENT AND PLAN:  1.  Chest pain, non cardiac with normal cardiac CTA 12/19/17  of chest. Reassured. Also had normal stress echo in  2017   2.  TIA followed by neuro  On ASA 81 mg daily. May 2019 not clear she had stroke history of headaches/ migraine MRI was normal normal CTA head and neck as well   3.  HLD on statin continue  4.  HTN Norvasc recently increased by primary improved   5.  Premature FH CAD.  See above calcium score 0 on CT 12/19/17 on statin   6. OB/GYN:  F/u with Dr Dellis Filbert after US done    Current medicines are reviewed with the patient today.  The patient Has no concerns regarding medicines.  The following changes have been made:  See above Labs/ tests ordered today include:see above  Disposition:   FU:  In a year   Signed, Jenkins Rouge, MD  03/24/2020 8:13 AM    [facility], [place], [place] Phone: [phone]; Fax: [phone]

## 2020-03-24 ENCOUNTER — Encounter: Payer: Self-pay | Admitting: Cardiovascular Disease

## 2020-03-24 ENCOUNTER — Other Ambulatory Visit: Payer: Self-pay

## 2020-03-24 ENCOUNTER — Ambulatory Visit: Payer: 59 | Admitting: Cardiovascular Disease

## 2020-03-24 VITALS — BP 128/78 | HR 78 | Ht 64.0 in | Wt 171.0 lb

## 2020-03-24 DIAGNOSIS — M25511 Pain in right shoulder: Secondary | ICD-10-CM | POA: Diagnosis not present

## 2020-03-24 DIAGNOSIS — I10 Essential (primary) hypertension: Secondary | ICD-10-CM | POA: Diagnosis not present

## 2020-03-24 NOTE — Patient Instructions (Signed)
Medication Instructions:  *If you need a refill on your cardiac medications before your next appointment, please call your pharmacy*  Lab Work: If you have labs (blood work) drawn today and your tests are completely normal, you will receive your results only by: Marland Kitchen MyChart Message (if you have MyChart) OR . A paper copy in the mail If you have any lab test that is abnormal or we need to change your treatment, we will call you to review the results.  Follow-Up: At [facility], you and your health needs are our priority.  As part of our continuing mission to provide you with exceptional heart care, we have created designated Provider Care Teams.  These Care Teams include your primary Cardiologist (physician) and Advanced Practice Providers (APPs -  Physician Assistants and Nurse Practitioners) who all work together to provide you with the care you need, when you need it.  We recommend signing up for the patient portal called "MyChart".  Sign up information is provided on this After Visit Summary.  MyChart is used to connect with patients for Virtual Visits (Telemedicine).  Patients are able to view lab/test results, encounter notes, upcoming appointments, etc.  Non-urgent messages can be sent to your provider as well.   To learn more about what you can do with MyChart, go to [email or web address].    Your next appointment:   1 year(s)  The format for your next appointment:   In Person  Provider:   You may see Dr. Johnsie Cancel or one of the following Advanced Practice Providers on your designated Care Team:    Truitt Merle, NP  Cecilie Kicks, NP  Kathyrn Drown, NP

## 2020-03-31 ENCOUNTER — Other Ambulatory Visit: Payer: Self-pay

## 2020-03-31 ENCOUNTER — Encounter: Payer: Self-pay | Admitting: Physical Therapy

## 2020-03-31 ENCOUNTER — Ambulatory Visit: Payer: 59 | Attending: Family Medicine | Admitting: Physical Therapy

## 2020-03-31 ENCOUNTER — Other Ambulatory Visit (HOSPITAL_COMMUNITY): Payer: Self-pay | Admitting: Gastroenterology

## 2020-03-31 DIAGNOSIS — M25511 Pain in right shoulder: Secondary | ICD-10-CM | POA: Insufficient documentation

## 2020-03-31 DIAGNOSIS — M542 Cervicalgia: Secondary | ICD-10-CM | POA: Insufficient documentation

## 2020-03-31 DIAGNOSIS — M25611 Stiffness of right shoulder, not elsewhere classified: Secondary | ICD-10-CM | POA: Diagnosis not present

## 2020-03-31 DIAGNOSIS — R252 Cramp and spasm: Secondary | ICD-10-CM | POA: Diagnosis not present

## 2020-03-31 MED FILL — LINZESS 290 MCG CAPSULE: 290 | 30 days supply | Qty: 30 | Fill #0

## 2020-03-31 NOTE — Therapy (Signed)
[place] [place], [place], [address] Phone: [phone]   Fax:  [phone]  Physical Therapy Evaluation  Patient Details  Name: Stacey Mendoza MRN: [id number] Date of Birth: September 02, 1965 Referring Provider (PT): Rankins   Encounter Date: 03/31/2020   PT End of Session - 03/31/20 1701    Visit Number 1    Date for PT Re-Evaluation 05/31/20    PT Start Time 1430    PT Stop Time 1515    PT Time Calculation (min) 45 min    Activity Tolerance Patient tolerated treatment well    Behavior During Therapy [facility] for tasks assessed/performed           Past Medical History:  Diagnosis Date  . Abdominal pain, chronic, right upper quadrant 11/28/2013  . Abdominal pain, right lower quadrant 06/29/2013  . Anemia   . Bulging disc    cervical MRI pending  . Chest pain 09/21/2017  . Dyspepsia and other specified disorders of function of stomach 04/10/2013  . Elevated LFTs   . Family history of breast cancer in female 11/28/2013  . Hypertension   . Hypokalemia 09/21/2017  . Midepigastric pain 10/23/2012  . Migraine headache 06/13/2013  . Nonspecific abnormal results of liver function study 06/29/2013  . Normal vaginal delivery 1992, 1986, 1990  . TIA (transient ischemic attack) 09/21/2017  . Unspecified constipation 10/23/2012    Past Surgical History:  Procedure Laterality Date  . CHOLECYSTECTOMY N/A 07/06/2017   Procedure: LAPAROSCOPIC CHOLECYSTECTOMY;  Surgeon: Kinsinger, Arta Bruce, MD;  Location: [facility];  Service: General;  Laterality: N/A;  . TUBAL LIGATION      There were no vitals filed for this visit.    Subjective Assessment - 03/31/20 1430    Subjective Pt reports that she is having neck pain for the past two months with radiating pain w N/T into L UE. Pt reports burning/soreness in L UT/periscapular/cervical region. Pt reports R shoulder/arm pain and weakness with reduced range of motion. Pt reports that she has  trouble reaching overhead, reaching behind head, and reaching behind back. Pt states that she is having trouble getting dressed and performing ADLs. Pt has had difficulty at work as a Secretary/administrator.    Pertinent History HTN, TIA 2019, neck pain    Limitations Lifting;House hold activities    Diagnostic tests none    Patient Stated Goals be able to move R arm normally again    Currently in Pain? Yes    Pain Score 0-No pain    Pain Location Neck    Pain Orientation Left;Mid    Pain Descriptors / Indicators Burning;Aching;Sore    Pain Type Chronic pain    Pain Radiating Towards L UE    Pain Onset More than a month ago    Pain Frequency Intermittent    Aggravating Factors  turning head, reaching overhead    Pain Relieving Factors rest, heat    Multiple Pain Sites Yes    Pain Score 0    Pain Location Shoulder    Pain Orientation Right    Pain Descriptors / Indicators Aching    Pain Type Chronic pain    Pain Onset More than a month ago    Pain Frequency Intermittent    Aggravating Factors  reaching overhead, reaching behind back, lifting    Pain Relieving Factors rest, position change    Effect of Pain on Daily Activities difficulty performing job duties, dressing d/t pain  [facility] PT Assessment - 03/31/20 0001      Assessment   Medical Diagnosis R shoulder pain/cervicalgia    Referring Provider (PT) Rankins    Hand Dominance Right    Prior Therapy PT for cervical pain      Precautions   Precautions None      Restrictions   Weight Bearing Restrictions No      Balance Screen   Has the patient fallen in the past 6 months No    Has the patient had a decrease in activity level because of a fear of falling?  No    Is the patient reluctant to leave their home because of a fear of falling?  No      Home Environment   Additional Comments housework, cleaning      Prior Function   Level of Independence Independent    Vocation Full time employment    Vocation  Requirements housekeeping      Sensation   Light Touch Impaired by gross assessment    Additional Comments mild deficit on L UE      ROM / Strength   AROM / PROM / Strength AROM;PROM;Strength      AROM   AROM Assessment Site Shoulder;Cervical    Right/Left Shoulder Right    Right Shoulder Flexion 125 Degrees    Right Shoulder ABduction 70 Degrees    Right Shoulder Internal Rotation --   unable to reach behind back   Right Shoulder External Rotation --   unable to put behind head; very painful/guarded   Cervical Flexion 55    Cervical Extension 35    Cervical - Right Side Bend 15    Cervical - Left Side Bend 20    Cervical - Right Rotation 55    Cervical - Left Rotation 75      PROM   PROM Assessment Site Shoulder    Right/Left Shoulder Right    Right Shoulder Flexion 135 Degrees    Right Shoulder ABduction 130 Degrees    Right Shoulder Internal Rotation 60 Degrees    Right Shoulder External Rotation 30 Degrees      Strength   Overall Strength Comments 5/5 BUE except IR/ER 4-/5 d/t pain      Palpation   Palpation comment tender to palpation L UT, L periscapulars, L cervical paraspinals, R shoulder      Special Tests    Special Tests Cervical    Cervical Tests Spurling's      Spurling's   Findings Positive    Side Left    Comment neg on R                      Objective measurements completed on examination: See above findings.       [facility] Adult PT Treatment/Exercise - 03/31/20 0001      Exercises   Exercises Shoulder;Neck      Neck Exercises: Theraband   Scapula Retraction 10 reps      Neck Exercises: Standing   Other Standing Exercises shoulder IR/ER isometrics      Neck Exercises: Seated   Neck Retraction 10 reps;3 secs      Shoulder Exercises: Stretch   Table Stretch - Flexion 5 reps;10 seconds    Table Stretch - Abduction 5 reps;10 seconds      Neck Exercises: Stretches   Upper Trapezius Stretch Right;Left;1 rep;30 seconds     Levator Stretch Right;Left;1 rep;30 seconds  PT Education - 03/31/20 1700    Education Details Pt educated on POC and HEP    Person(s) Educated Patient    Methods Explanation;Demonstration;Handout    Comprehension Verbalized understanding;Returned demonstration            PT Short Term Goals - 03/31/20 1709      PT SHORT TERM GOAL #1   Title Pt will be independent with initial HEP    Time 2    Period Weeks    Status New    Target Date 04/14/20             PT Long Term Goals - 03/31/20 1710      PT LONG TERM GOAL #1   Title Pt will be I with advanced HEP    Time 8    Period Weeks    Status New    Target Date 05/26/20      PT LONG TERM GOAL #2   Title Pt will report 50% reduction in R shoulder pain    Time 8    Period Weeks    Status New    Target Date 05/26/20      PT LONG TERM GOAL #3   Title Pt will report resolution in radiating pain in R UE    Time 8    Period Weeks    Status New    Target Date 05/26/20      PT LONG TERM GOAL #4   Title Pt will demo R shoulder ROM equivalent to L shoulder    Time 8    Period Weeks    Status New    Target Date 05/26/20      PT LONG TERM GOAL #5   Title Pt will demo cervical ROM WFL with </=1/10 reports of pain    Time 8    Period Weeks    Status New    Target Date 05/26/20                  Plan - 03/31/20 1701    Clinical Impression Statement Pt presents to clinic with reports of chronic cervical pain with radiating pain/N/T to LUE and R shoulder pain present for ~2-3 months, no known MOI. Pt has received PT for cervical pain/radiating pain before and states that it was helpful. Pt demos reports of diminished sensation on LUE compared to RUE, tenderness of cervical paraspinals/UT, and limited cervical ROM. Pt R shoulder demos decreased ROM, diminished MMT d/t R shoulder pain, pain with resisted IR/ER, and limitations globally in R shoulder ROM. Pt would benefit from skilled PT to  address the above impairments.    Personal Factors and Comorbidities Comorbidity 2    Comorbidities HTN, hx of TIA affecting LLE    Examination-Activity Limitations Carry;Lift;Reach Overhead    Examination-Participation Restrictions Community Activity;Occupation;Interpersonal Relationship;Meal Prep    Stability/Clinical Decision Making Evolving/Moderate complexity    Clinical Decision Making Low    Rehab Potential Good    PT Frequency 2x / week    PT Duration 8 weeks    PT Treatment/Interventions ADLs/Self Care Home Management;Electrical Stimulation;Iontophoresis 4mg /ml Dexamethasone;Moist Heat;Traction;Neuromuscular re-education;Therapeutic exercise;Therapeutic activities;Patient/family education;Manual techniques;Dry needling;Passive range of motion;Taping    PT Next Visit Plan UE/cervical flexibility, shoulder strengthening, manual/modalities as needed    PT Home Exercise Plan IR/ER isometrics, UT/levator stretch, scap retraction, neck retraction    Consulted and Agree with Plan of Care Patient           Patient will benefit from skilled therapeutic  intervention in order to improve the following deficits and impairments:  Decreased range of motion, Increased muscle spasms, Impaired UE functional use, Decreased activity tolerance, Pain, Hypomobility, Impaired flexibility, Decreased strength, Impaired sensation, Postural dysfunction  Visit Diagnosis: Acute pain of right shoulder  Stiffness of right shoulder, not elsewhere classified  Cervicalgia  Cramp and spasm     Problem List Patient Active Problem List   Diagnosis Date Noted  . TIA (transient ischemic attack) 09/21/2017  . Chest pain 09/21/2017  . Hypertension 09/21/2017  . Hypokalemia 09/21/2017  . Abdominal pain, chronic, right upper quadrant 11/28/2013  . Family history of breast cancer in female 11/28/2013  . Nonspecific abnormal results of liver function study 06/29/2013  . Abdominal pain, right lower quadrant  06/29/2013  . Migraine headache 06/13/2013  . Dyspepsia and other specified disorders of function of stomach 04/10/2013  . Midepigastric pain 10/23/2012  . Unspecified constipation 10/23/2012   Amador Cunas, PT, DPT Donald Prose Levante Simones 03/31/2020, 5:22 PM  [place]. [place], [place], [address] Phone: [phone]   Fax:  [phone]  Name: Stacey Mendoza MRN: [id number] Date of Birth: Oct 04, 1965

## 2020-04-03 ENCOUNTER — Ambulatory Visit: Payer: 59 | Admitting: Physical Therapy

## 2020-04-03 ENCOUNTER — Other Ambulatory Visit: Payer: Self-pay

## 2020-04-03 ENCOUNTER — Encounter: Payer: Self-pay | Admitting: Physical Therapy

## 2020-04-03 DIAGNOSIS — M542 Cervicalgia: Secondary | ICD-10-CM | POA: Diagnosis not present

## 2020-04-03 DIAGNOSIS — R252 Cramp and spasm: Secondary | ICD-10-CM

## 2020-04-03 DIAGNOSIS — M25611 Stiffness of right shoulder, not elsewhere classified: Secondary | ICD-10-CM

## 2020-04-03 DIAGNOSIS — M25511 Pain in right shoulder: Secondary | ICD-10-CM

## 2020-04-03 NOTE — Therapy (Signed)
[place]. [place], [place], [address] Phone: [phone]   Fax:  [phone]  Physical Therapy Treatment  Patient Details  Name: Stacey Mendoza MRN: [id number] Date of Birth: Mar 09, 1966 Referring Provider (PT): Rankins   Encounter Date: 04/03/2020   PT End of Session - 04/03/20 1756    Visit Number 2    Date for PT Re-Evaluation 05/31/20    PT Start Time 1700    PT Stop Time 1746    PT Time Calculation (min) 46 min    Activity Tolerance Patient tolerated treatment well    Behavior During Therapy [facility] for tasks assessed/performed           Past Medical History:  Diagnosis Date  . Abdominal pain, chronic, right upper quadrant 11/28/2013  . Abdominal pain, right lower quadrant 06/29/2013  . Anemia   . Bulging disc    cervical MRI pending  . Chest pain 09/21/2017  . Dyspepsia and other specified disorders of function of stomach 04/10/2013  . Elevated LFTs   . Family history of breast cancer in female 11/28/2013  . Hypertension   . Hypokalemia 09/21/2017  . Midepigastric pain 10/23/2012  . Migraine headache 06/13/2013  . Nonspecific abnormal results of liver function study 06/29/2013  . Normal vaginal delivery 1992, 1986, 1990  . TIA (transient ischemic attack) 09/21/2017  . Unspecified constipation 10/23/2012    Past Surgical History:  Procedure Laterality Date  . CHOLECYSTECTOMY N/A 07/06/2017   Procedure: LAPAROSCOPIC CHOLECYSTECTOMY;  Surgeon: Kinsinger, Arta Bruce, MD;  Location: [facility];  Service: General;  Laterality: N/A;  . TUBAL LIGATION      There were no vitals filed for this visit.   Subjective Assessment - 04/03/20 1705    Subjective Pt reports that ex's are helping. Denies radiating pain into L UE this rx. Pt states she is very sore after working all day.    Currently in Pain? Yes    Pain Score 3     Pain Location Neck                             [facility] Adult PT  Treatment/Exercise - 04/03/20 0001      Neck Exercises: Machines for Strengthening   UBE (Upper Arm Bike) L1.5 3 fwd/3 bkwd      Neck Exercises: Standing   Other Standing Exercises standing rows and shoulder ext green TB 2x10      Neck Exercises: Seated   Neck Retraction 10 reps;3 secs    Neck Retraction Limitations into ball      Shoulder Exercises: Stretch   Wall Stretch - Flexion 5 reps;10 seconds    Wall Stretch - Flexion Limitations ROM limited by pain    Wall Stretch - ABduction 5 reps;10 seconds      Modalities   Modalities Electrical Stimulation;Moist Heat      Moist Heat Therapy   Number Minutes Moist Heat 12 Minutes    Moist Heat Location Cervical      Electrical Stimulation   Electrical Stimulation Location cervical/UT    Electrical Stimulation Action IFC    Electrical Stimulation Parameters supine    Electrical Stimulation Goals Pain      Manual Therapy   Manual Therapy Soft tissue mobilization;Passive ROM;Manual Traction    Soft tissue mobilization STM to L UT    Passive ROM PROM to cervical all directions; R shoulder PROM all directions  Manual Traction manual cervical traction x10                    PT Short Term Goals - 04/03/20 1759      PT SHORT TERM GOAL #1   Title Pt will be independent with initial HEP    Time 2    Period Weeks    Status Achieved    Target Date 04/14/20             PT Long Term Goals - 03/31/20 1710      PT LONG TERM GOAL #1   Title Pt will be I with advanced HEP    Time 8    Period Weeks    Status New    Target Date 05/26/20      PT LONG TERM GOAL #2   Title Pt will report 50% reduction in R shoulder pain    Time 8    Period Weeks    Status New    Target Date 05/26/20      PT LONG TERM GOAL #3   Title Pt will report resolution in radiating pain in R UE    Time 8    Period Weeks    Status New    Target Date 05/26/20      PT LONG TERM GOAL #4   Title Pt will demo R shoulder ROM equivalent to L  shoulder    Time 8    Period Weeks    Status New    Target Date 05/26/20      PT LONG TERM GOAL #5   Title Pt will demo cervical ROM WFL with </=1/10 reports of pain    Time 8    Period Weeks    Status New    Target Date 05/26/20                 Plan - 04/03/20 1757    Clinical Impression Statement Pt tolerated progression to TE well. Discussed with pt that R shoulder and L neck/radiating LUE pain are potentially two different issues. Pt had difficulty with R shoulder AAROM ex's and reports mild R shoulder pain with standing rows and extensions. STM to L UT; reports reproduction of LUE radiating pain with deep pressure. States no change with cervical traction. PROM to R shoulder for increased ROM. Pt reports relief from heat and estim.    PT Treatment/Interventions ADLs/Self Care Home Management;Electrical Stimulation;Iontophoresis 4mg /ml Dexamethasone;Moist Heat;Traction;Neuromuscular re-education;Therapeutic exercise;Therapeutic activities;Patient/family education;Manual techniques;Dry needling;Passive range of motion;Taping    PT Next Visit Plan UE/cervical flexibility, shoulder strengthening, manual/modalities as needed    Consulted and Agree with Plan of Care Patient           Patient will benefit from skilled therapeutic intervention in order to improve the following deficits and impairments:  Decreased range of motion, Increased muscle spasms, Impaired UE functional use, Decreased activity tolerance, Pain, Hypomobility, Impaired flexibility, Decreased strength, Impaired sensation, Postural dysfunction  Visit Diagnosis: Acute pain of right shoulder  Stiffness of right shoulder, not elsewhere classified  Cervicalgia  Cramp and spasm     Problem List Patient Active Problem List   Diagnosis Date Noted  . TIA (transient ischemic attack) 09/21/2017  . Chest pain 09/21/2017  . Hypertension 09/21/2017  . Hypokalemia 09/21/2017  . Abdominal pain, chronic, right  upper quadrant 11/28/2013  . Family history of breast cancer in female 11/28/2013  . Nonspecific abnormal results of liver function study 06/29/2013  . Abdominal pain, right  lower quadrant 06/29/2013  . Migraine headache 06/13/2013  . Dyspepsia and other specified disorders of function of stomach 04/10/2013  . Midepigastric pain 10/23/2012  . Unspecified constipation 10/23/2012   Amador Cunas, PT, DPT Donald Prose Mikail Goostree 04/03/2020, 6:00 PM  [place]. [place], [place], [address] Phone: [phone]   Fax:  [phone]  Name: ANGALENA COUSINEAU MRN: [id number] Date of Birth: 11/01/1965

## 2020-04-07 ENCOUNTER — Other Ambulatory Visit (HOSPITAL_COMMUNITY): Payer: Self-pay | Admitting: Family Medicine

## 2020-04-07 MED FILL — HYDROCHLOROTHIAZIDE 12.5 MG: 12.5 | 90 days supply | Qty: 90 | Fill #0

## 2020-04-09 ENCOUNTER — Encounter: Payer: Self-pay | Admitting: Physical Therapy

## 2020-04-09 ENCOUNTER — Ambulatory Visit: Payer: 59 | Admitting: Physical Therapy

## 2020-04-09 ENCOUNTER — Other Ambulatory Visit: Payer: Self-pay

## 2020-04-09 DIAGNOSIS — M25511 Pain in right shoulder: Secondary | ICD-10-CM

## 2020-04-09 DIAGNOSIS — M25611 Stiffness of right shoulder, not elsewhere classified: Secondary | ICD-10-CM | POA: Diagnosis not present

## 2020-04-09 DIAGNOSIS — R252 Cramp and spasm: Secondary | ICD-10-CM | POA: Diagnosis not present

## 2020-04-09 DIAGNOSIS — M542 Cervicalgia: Secondary | ICD-10-CM

## 2020-04-09 NOTE — Therapy (Signed)
[address]. [place], [place], [address] Phone: [phone]   Fax:  [phone]  Physical Therapy Treatment  Patient Details  Name: Stacey Mendoza MRN: [id number] Date of Birth: 1966/01/19 Referring Provider (PT): Rankins   Encounter Date: 04/09/2020   PT End of Session - 04/09/20 1345    PT Start Time 9373    PT Stop Time 1345    PT Time Calculation (min) 47 min    Activity Tolerance Patient limited by pain    Behavior During Therapy [facility] for tasks assessed/performed           Past Medical History:  Diagnosis Date  . Abdominal pain, chronic, right upper quadrant 11/28/2013  . Abdominal pain, right lower quadrant 06/29/2013  . Anemia   . Bulging disc    cervical MRI pending  . Chest pain 09/21/2017  . Dyspepsia and other specified disorders of function of stomach 04/10/2013  . Elevated LFTs   . Family history of breast cancer in female 11/28/2013  . Hypertension   . Hypokalemia 09/21/2017  . Midepigastric pain 10/23/2012  . Migraine headache 06/13/2013  . Nonspecific abnormal results of liver function study 06/29/2013  . Normal vaginal delivery 1992, 1986, 1990  . TIA (transient ischemic attack) 09/21/2017  . Unspecified constipation 10/23/2012    Past Surgical History:  Procedure Laterality Date  . CHOLECYSTECTOMY N/A 07/06/2017   Procedure: LAPAROSCOPIC CHOLECYSTECTOMY;  Surgeon: Mendoza, Stacey Bruce, MD;  Location: [facility];  Service: General;  Laterality: N/A;  . TUBAL LIGATION      There were no vitals filed for this visit.   Subjective Assessment - 04/09/20 1259    Subjective Patient is doing okay. Complaints of new pain around anterior joint of R shoulder capsule.    Currently in Pain? Yes    Pain Score 2                              [facility] Adult PT Treatment/Exercise - 04/09/20 0001      Neck Exercises: Machines for Strengthening   UBE (Upper Arm Bike) L1.5 3 fwd/3 bkwd       Neck Exercises: Supine   Other Supine Exercise Neck ISO's all directions 10x5"      Shoulder Exercises: Supine   Other Supine Exercises PROM UT str. 10x5"      Shoulder Exercises: Standing   External Rotation Right;10 reps;Strengthening    Theraband Level (Shoulder External Rotation) Level 1 (Yellow)    Internal Rotation 10 reps;Theraband;Strengthening;Right    Theraband Level (Shoulder Internal Rotation) Level 1 (Yellow)    Extension Both;10 reps;Strengthening   3" hold   Row 10 reps;Both;Strengthening   3" hold   Theraband Level (Shoulder Row) Level 3 (Green)    Retraction Both;10 reps;Theraband    Theraband Level (Shoulder Retraction) Level 1 (Yellow)    Other Standing Exercises Wall slides flex/abd. 10x5"       Manual Therapy   Manual Therapy Soft tissue mobilization;Passive ROM    Soft tissue mobilization STM to biceps/ant joint capsule    Passive ROM PROM to cervical all directions; R shoulder PROM all directions                    PT Short Term Goals - 04/03/20 1759      PT SHORT TERM GOAL #1   Title Pt will be independent with initial HEP    Time  2    Period Weeks    Status Achieved    Target Date 04/14/20             PT Long Term Goals - 04/09/20 1301      PT LONG TERM GOAL #1   Title Pt will be I with advanced HEP    Status Achieved      PT LONG TERM GOAL #2   Title Pt will report 50% reduction in R shoulder pain    Baseline No reduction in pain    Status On-going      PT LONG TERM GOAL #3   Title Pt will report resolution in radiating pain in R UE    Baseline Said she has pain that radiates down her L UE but nothing in her R UE.    Status Achieved      PT LONG TERM GOAL #4   Title Pt will demo R shoulder ROM equivalent to L shoulder    Status On-going      PT LONG TERM GOAL #5   Title Pt will demo cervical ROM WFL with </=1/10 reports of pain    Status On-going                 Plan - 04/09/20 1256    Clinical Impression  Statement Patient was very limited in todays session due to pain. Patient is unable to perform AROM to available full range due to shoulder pain. During PROM she is very gaurded an unable to relax. Noted fibrous tissue in biceps area.    PT Treatment/Interventions ADLs/Self Care Home Management;Electrical Stimulation;Iontophoresis 4mg /ml Dexamethasone;Moist Heat;Traction;Neuromuscular re-education;Therapeutic exercise;Therapeutic activities;Patient/family education;Manual techniques;Dry needling;Passive range of motion;Taping    PT Next Visit Plan UE/cervical flexibility, shoulder strengthening, manual/modalities as needed           Patient will benefit from skilled therapeutic intervention in order to improve the following deficits and impairments:  Decreased range of motion, Increased muscle spasms, Impaired UE functional use, Decreased activity tolerance, Pain, Hypomobility, Impaired flexibility, Decreased strength, Impaired sensation, Postural dysfunction  Visit Diagnosis: Acute pain of right shoulder  Stiffness of right shoulder, not elsewhere classified  Cervicalgia  Cramp and spasm     Problem List Patient Active Problem List   Diagnosis Date Noted  . TIA (transient ischemic attack) 09/21/2017  . Chest pain 09/21/2017  . Hypertension 09/21/2017  . Hypokalemia 09/21/2017  . Abdominal pain, chronic, right upper quadrant 11/28/2013  . Family history of breast cancer in female 11/28/2013  . Nonspecific abnormal results of liver function study 06/29/2013  . Abdominal pain, right lower quadrant 06/29/2013  . Migraine headache 06/13/2013  . Dyspepsia and other specified disorders of function of stomach 04/10/2013  . Midepigastric pain 10/23/2012  . Unspecified constipation 10/23/2012    Stacey Mendoza 04/09/2020, 1:53 PM  [place]. [place], [place], [address] Phone: [phone]   Fax:   [phone]  Name: Stacey Mendoza MRN: [id number] Date of Birth: 09/21/1965

## 2020-04-15 ENCOUNTER — Ambulatory Visit: Payer: 59 | Admitting: Physical Therapy

## 2020-04-15 ENCOUNTER — Other Ambulatory Visit: Payer: Self-pay

## 2020-04-15 ENCOUNTER — Encounter: Payer: Self-pay | Admitting: Physical Therapy

## 2020-04-15 DIAGNOSIS — M25611 Stiffness of right shoulder, not elsewhere classified: Secondary | ICD-10-CM | POA: Diagnosis not present

## 2020-04-15 DIAGNOSIS — M542 Cervicalgia: Secondary | ICD-10-CM

## 2020-04-15 DIAGNOSIS — M25511 Pain in right shoulder: Secondary | ICD-10-CM | POA: Diagnosis not present

## 2020-04-15 DIAGNOSIS — R252 Cramp and spasm: Secondary | ICD-10-CM | POA: Diagnosis not present

## 2020-04-15 NOTE — Therapy (Signed)
[place]. [place], [place], [address] Phone: [phone]   Fax:  [phone]  Physical Therapy Treatment  Patient Details  Name: Stacey Mendoza MRN: [id number] Date of Birth: May 23, 1965 Referring Provider (PT): Rankins   Encounter Date: 04/15/2020   PT End of Session - 04/15/20 1717    Visit Number 4    Date for PT Re-Evaluation 05/31/20    PT Start Time 5329    PT Stop Time 1652    PT Time Calculation (min) 42 min    Activity Tolerance Patient limited by pain    Behavior During Therapy [facility] for tasks assessed/performed           Past Medical History:  Diagnosis Date  . Abdominal pain, chronic, right upper quadrant 11/28/2013  . Abdominal pain, right lower quadrant 06/29/2013  . Anemia   . Bulging disc    cervical MRI pending  . Chest pain 09/21/2017  . Dyspepsia and other specified disorders of function of stomach 04/10/2013  . Elevated LFTs   . Family history of breast cancer in female 11/28/2013  . Hypertension   . Hypokalemia 09/21/2017  . Midepigastric pain 10/23/2012  . Migraine headache 06/13/2013  . Nonspecific abnormal results of liver function study 06/29/2013  . Normal vaginal delivery 1992, 1986, 1990  . TIA (transient ischemic attack) 09/21/2017  . Unspecified constipation 10/23/2012    Past Surgical History:  Procedure Laterality Date  . CHOLECYSTECTOMY N/A 07/06/2017   Procedure: LAPAROSCOPIC CHOLECYSTECTOMY;  Surgeon: Kinsinger, Arta Bruce, MD;  Location: [facility];  Service: General;  Laterality: N/A;  . TUBAL LIGATION      There were no vitals filed for this visit.   Subjective Assessment - 04/15/20 1614    Subjective Pt reports L neck is feeling much better; having some pain ant R shoulder    Currently in Pain? Yes    Pain Score 0-No pain    Pain Location Neck    Pain Orientation Left    Pain Score 0    Pain Location Shoulder    Pain Orientation Right                              [facility] Adult PT Treatment/Exercise - 04/15/20 0001      Neck Exercises: Machines for Strengthening   UBE (Upper Arm Bike) L2 3 fwd/3bkwd      Shoulder Exercises: Supine   Other Supine Exercises 2# weight bar chest press with shoulder flexion2x10      Shoulder Exercises: Standing   External Rotation Right;10 reps;Strengthening    Theraband Level (Shoulder External Rotation) Level 1 (Yellow)    Internal Rotation 10 reps;Theraband;Strengthening;Right    Theraband Level (Shoulder Internal Rotation) Level 1 (Yellow)    Extension Both;10 reps;Strengthening    Theraband Level (Shoulder Extension) Level 1 (Yellow)    Row 10 reps;Both;Strengthening    Theraband Level (Shoulder Row) Level 1 (Yellow)      Manual Therapy   Manual Therapy Soft tissue mobilization;Passive ROM    Soft tissue mobilization STM to biceps/ant joint capsule    Passive ROM PROM to cervical all directions; R shoulder PROM all directions    Manual Traction manual cervical traction x10                    PT Short Term Goals - 04/03/20 1759      PT SHORT TERM GOAL #1  Title Pt will be independent with initial HEP    Time 2    Period Weeks    Status Achieved    Target Date 04/14/20             PT Long Term Goals - 04/09/20 1301      PT LONG TERM GOAL #1   Title Pt will be I with advanced HEP    Status Achieved      PT LONG TERM GOAL #2   Title Pt will report 50% reduction in R shoulder pain    Baseline No reduction in pain    Status On-going      PT LONG TERM GOAL #3   Title Pt will report resolution in radiating pain in R UE    Baseline Said she has pain that radiates down her L UE but nothing in her R UE.    Status Achieved      PT LONG TERM GOAL #4   Title Pt will demo R shoulder ROM equivalent to L shoulder    Status On-going      PT LONG TERM GOAL #5   Title Pt will demo cervical ROM WFL with </=1/10 reports of pain    Status On-going                  Plan - 04/15/20 1718    Clinical Impression Statement Pt still demos limited R shoulder AROM/PROM d/t pain. Pt localizes pain to R biceps and R ant shoulder capsule. Educated on stretching within a painfree range and modification of activity at work.    PT Treatment/Interventions ADLs/Self Care Home Management;Electrical Stimulation;Iontophoresis 4mg /ml Dexamethasone;Moist Heat;Traction;Neuromuscular re-education;Therapeutic exercise;Therapeutic activities;Patient/family education;Manual techniques;Dry needling;Passive range of motion;Taping    PT Next Visit Plan UE/cervical flexibility, shoulder strengthening, manual/modalities as needed    Consulted and Agree with Plan of Care Patient           Patient will benefit from skilled therapeutic intervention in order to improve the following deficits and impairments:  Decreased range of motion, Increased muscle spasms, Impaired UE functional use, Decreased activity tolerance, Pain, Hypomobility, Impaired flexibility, Decreased strength, Impaired sensation, Postural dysfunction  Visit Diagnosis: Acute pain of right shoulder  Stiffness of right shoulder, not elsewhere classified  Cervicalgia  Cramp and spasm     Problem List Patient Active Problem List   Diagnosis Date Noted  . TIA (transient ischemic attack) 09/21/2017  . Chest pain 09/21/2017  . Hypertension 09/21/2017  . Hypokalemia 09/21/2017  . Abdominal pain, chronic, right upper quadrant 11/28/2013  . Family history of breast cancer in female 11/28/2013  . Nonspecific abnormal results of liver function study 06/29/2013  . Abdominal pain, right lower quadrant 06/29/2013  . Migraine headache 06/13/2013  . Dyspepsia and other specified disorders of function of stomach 04/10/2013  . Midepigastric pain 10/23/2012  . Unspecified constipation 10/23/2012   Amador Cunas, PT, DPT Donald Prose Marcello Tuzzolino 04/15/2020, 5:32 PM  [place]. [place], [place], [address] Phone: [phone]   Fax:  [phone]  Name: CULLEN VANALLEN MRN: [id number] Date of Birth: Nov 09, 1965

## 2020-04-21 ENCOUNTER — Ambulatory Visit: Payer: 59 | Admitting: Physical Therapy

## 2020-04-21 ENCOUNTER — Encounter: Payer: Self-pay | Admitting: Physical Therapy

## 2020-04-21 ENCOUNTER — Other Ambulatory Visit: Payer: Self-pay

## 2020-04-21 ENCOUNTER — Ambulatory Visit: Payer: 59 | Attending: Family Medicine | Admitting: Physical Therapy

## 2020-04-21 DIAGNOSIS — M25611 Stiffness of right shoulder, not elsewhere classified: Secondary | ICD-10-CM | POA: Insufficient documentation

## 2020-04-21 DIAGNOSIS — M25511 Pain in right shoulder: Secondary | ICD-10-CM | POA: Diagnosis not present

## 2020-04-21 DIAGNOSIS — R252 Cramp and spasm: Secondary | ICD-10-CM | POA: Insufficient documentation

## 2020-04-21 DIAGNOSIS — M542 Cervicalgia: Secondary | ICD-10-CM | POA: Diagnosis not present

## 2020-04-21 NOTE — Therapy (Signed)
[place]. [place], [place], [address] Phone: [phone]   Fax:  [phone]  Physical Therapy Treatment  Patient Details  Name: Stacey Mendoza MRN: [id number] Date of Birth: 1965/09/19 Referring Provider (PT): Rankins   Encounter Date: 04/21/2020   PT End of Session - 04/21/20 1602    Visit Number 5    Date for PT Re-Evaluation 05/31/20    PT Start Time 1600    PT Stop Time 2010    PT Time Calculation (min) 45 min    Activity Tolerance Patient limited by pain;Patient tolerated treatment well    Behavior During Therapy [facility] for tasks assessed/performed           Past Medical History:  Diagnosis Date  . Abdominal pain, chronic, right upper quadrant 11/28/2013  . Abdominal pain, right lower quadrant 06/29/2013  . Anemia   . Bulging disc    cervical MRI pending  . Chest pain 09/21/2017  . Dyspepsia and other specified disorders of function of stomach 04/10/2013  . Elevated LFTs   . Family history of breast cancer in female 11/28/2013  . Hypertension   . Hypokalemia 09/21/2017  . Midepigastric pain 10/23/2012  . Migraine headache 06/13/2013  . Nonspecific abnormal results of liver function study 06/29/2013  . Normal vaginal delivery 1992, 1986, 1990  . TIA (transient ischemic attack) 09/21/2017  . Unspecified constipation 10/23/2012    Past Surgical History:  Procedure Laterality Date  . CHOLECYSTECTOMY N/A 07/06/2017   Procedure: LAPAROSCOPIC CHOLECYSTECTOMY;  Surgeon: Kinsinger, Arta Bruce, MD;  Location: [facility];  Service: General;  Laterality: N/A;  . TUBAL LIGATION      There were no vitals filed for this visit.   Subjective Assessment - 04/21/20 1601    Subjective "Tired" Reports she had the weekend off but worked all day today so she is feeling her shoulder a bit more.                             [facility] Adult PT Treatment/Exercise - 04/21/20 0001      Neck Exercises: Machines for  Strengthening   UBE (Upper Arm Bike) L2 3 fwd/3bkwd    Nustep L4 15mins      Neck Exercises: Seated   Other Seated Exercise UT str. 5x10" ea side (#4 DB's)    Other Seated Exercise Shoulder rolls frwd/back x10      Shoulder Exercises: Supine   Other Supine Exercises bar chest press/flex x15 (#2 wt bar)    Other Supine Exercises Serratus punches 10x2"       Shoulder Exercises: Prone   Other Prone Exercises Row x10 (P!)      Shoulder Exercises: Sidelying   External Rotation Right;20 reps;Weights    External Rotation Weight (lbs) #1    ABduction Right;20 reps;Weights    ABduction Weight (lbs) #1      Shoulder Exercises: Standing   External Rotation Right;10 reps;Strengthening    Theraband Level (Shoulder External Rotation) Level 2 (Red);Level 1 (Yellow)    Internal Rotation 10 reps;Theraband;Strengthening;Right    Theraband Level (Shoulder Internal Rotation) Level 2 (Red);Level 1 (Yellow)    Extension Both;Strengthening;20 reps    Theraband Level (Shoulder Extension) Level 2 (Red)    Row Both;Strengthening;20 reps    Theraband Level (Shoulder Row) Level 2 (Red)    Other Standing Exercises Wall walks (yellow TB) x5    Other Standing Exercises Hitch hikers [phone]  foam roller                    PT Short Term Goals - 04/03/20 1759      PT SHORT TERM GOAL #1   Title Pt will be independent with initial HEP    Time 2    Period Weeks    Status Achieved    Target Date 04/14/20             PT Long Term Goals - 04/09/20 1301      PT LONG TERM GOAL #1   Title Pt will be I with advanced HEP    Status Achieved      PT LONG TERM GOAL #2   Title Pt will report 50% reduction in R shoulder pain    Baseline No reduction in pain    Status On-going      PT LONG TERM GOAL #3   Title Pt will report resolution in radiating pain in R UE    Baseline Said she has pain that radiates down her L UE but nothing in her R UE.    Status Achieved      PT LONG TERM GOAL #4   Title  Pt will demo R shoulder ROM equivalent to L shoulder    Status On-going      PT LONG TERM GOAL #5   Title Pt will demo cervical ROM WFL with </=1/10 reports of pain    Status On-going                 Plan - 04/21/20 1602    Clinical Impression Statement Patient was experiencing some pain before and during treatment today but was able to do most ther ex activites. Shoulder was pretty tired and sore from working full day. Patient is limited in shoulder abduction/flexion agaist gravity in standing. Tenderness around anterior capsule.    PT Treatment/Interventions ADLs/Self Care Home Management;Electrical Stimulation;Iontophoresis 4mg /ml Dexamethasone;Moist Heat;Traction;Neuromuscular re-education;Therapeutic exercise;Therapeutic activities;Patient/family education;Manual techniques;Dry needling;Passive range of motion;Taping    PT Next Visit Plan UE/cervical flexibility, shoulder strengthening, manual/modalities as needed           Patient will benefit from skilled therapeutic intervention in order to improve the following deficits and impairments:  Decreased range of motion, Increased muscle spasms, Impaired UE functional use, Decreased activity tolerance, Pain, Hypomobility, Impaired flexibility, Decreased strength, Impaired sensation, Postural dysfunction  Visit Diagnosis: Stiffness of right shoulder, not elsewhere classified  Acute pain of right shoulder  Cervicalgia  Cramp and spasm     Problem List Patient Active Problem List   Diagnosis Date Noted  . TIA (transient ischemic attack) 09/21/2017  . Chest pain 09/21/2017  . Hypertension 09/21/2017  . Hypokalemia 09/21/2017  . Abdominal pain, chronic, right upper quadrant 11/28/2013  . Family history of breast cancer in female 11/28/2013  . Nonspecific abnormal results of liver function study 06/29/2013  . Abdominal pain, right lower quadrant 06/29/2013  . Migraine headache 06/13/2013  . Dyspepsia and other specified  disorders of function of stomach 04/10/2013  . Midepigastric pain 10/23/2012  . Unspecified constipation 10/23/2012    [name], SPTA 04/21/2020, 4:47 PM  [place]. [place], [place], [address] Phone: [phone]   Fax:  [phone]  Name: Stacey Mendoza MRN: [id number] Date of Birth: April 02, 1966

## 2020-04-23 ENCOUNTER — Other Ambulatory Visit: Payer: Self-pay

## 2020-04-23 ENCOUNTER — Ambulatory Visit: Payer: 59 | Admitting: Physical Therapy

## 2020-04-23 ENCOUNTER — Encounter: Payer: Self-pay | Admitting: Physical Therapy

## 2020-04-23 DIAGNOSIS — M25511 Pain in right shoulder: Secondary | ICD-10-CM

## 2020-04-23 DIAGNOSIS — R252 Cramp and spasm: Secondary | ICD-10-CM | POA: Diagnosis not present

## 2020-04-23 DIAGNOSIS — M25611 Stiffness of right shoulder, not elsewhere classified: Secondary | ICD-10-CM | POA: Diagnosis not present

## 2020-04-23 DIAGNOSIS — M542 Cervicalgia: Secondary | ICD-10-CM | POA: Diagnosis not present

## 2020-04-23 NOTE — Therapy (Signed)
[place] [address] [place]. [place], [place], [address] Phone: [phone]   Fax:  [phone]  Physical Therapy Treatment  Patient Details  Name: Stacey Mendoza MRN: [id number] Date of Birth: 26-Jul-1965 Referring Provider (PT): Rankins   Encounter Date: 04/23/2020   PT End of Session - 04/23/20 1655    Visit Number 6    Date for PT Re-Evaluation 05/31/20    PT Start Time 3151    PT Stop Time 1657    PT Time Calculation (min) 41 min    Activity Tolerance Patient limited by pain;Patient tolerated treatment well    Behavior During Therapy [facility] for tasks assessed/performed           Past Medical History:  Diagnosis Date  . Abdominal pain, chronic, right upper quadrant 11/28/2013  . Abdominal pain, right lower quadrant 06/29/2013  . Anemia   . Bulging disc    cervical MRI pending  . Chest pain 09/21/2017  . Dyspepsia and other specified disorders of function of stomach 04/10/2013  . Elevated LFTs   . Family history of breast cancer in female 11/28/2013  . Hypertension   . Hypokalemia 09/21/2017  . Midepigastric pain 10/23/2012  . Migraine headache 06/13/2013  . Nonspecific abnormal results of liver function study 06/29/2013  . Normal vaginal delivery 1992, 1986, 1990  . TIA (transient ischemic attack) 09/21/2017  . Unspecified constipation 10/23/2012    Past Surgical History:  Procedure Laterality Date  . CHOLECYSTECTOMY N/A 07/06/2017   Procedure: LAPAROSCOPIC CHOLECYSTECTOMY;  Surgeon: Kinsinger, Arta Bruce, MD;  Location: [facility];  Service: General;  Laterality: N/A;  . TUBAL LIGATION      There were no vitals filed for this visit.   Subjective Assessment - 04/23/20 1629    Subjective Pt reports no change    Currently in Pain? No/denies    Pain Score 0-No pain              [facility] PT Assessment - 04/23/20 0001      AROM   Right Shoulder Flexion 125 Degrees    Right Shoulder ABduction 95 Degrees      PROM   Right  Shoulder Flexion 135 Degrees    Right Shoulder ABduction 110 Degrees    Right Shoulder Internal Rotation 75 Degrees    Right Shoulder External Rotation 60 Degrees                         [facility] Adult PT Treatment/Exercise - 04/23/20 0001      Neck Exercises: Machines for Strengthening   UBE (Upper Arm Bike) L2 3 fwd/3bkwd    Nustep L5 x 5 min      Shoulder Exercises: Supine   Other Supine Exercises bar chest press/flex x15 (#2 wt bar)    Other Supine Exercises Serratus punches 10x2"       Shoulder Exercises: Standing   Extension Both;Strengthening;20 reps    Theraband Level (Shoulder Extension) Level 2 (Red)    Row Both;Strengthening;20 reps    Theraband Level (Shoulder Row) Level 2 (Red)    Other Standing Exercises standing shoulder flex/abd x10 with 5 sec hold                    PT Short Term Goals - 04/03/20 1759      PT SHORT TERM GOAL #1   Title Pt will be independent with initial HEP    Time 2  Period Weeks    Status Achieved    Target Date 04/14/20             PT Long Term Goals - 04/09/20 1301      PT LONG TERM GOAL #1   Title Pt will be I with advanced HEP    Status Achieved      PT LONG TERM GOAL #2   Title Pt will report 50% reduction in R shoulder pain    Baseline No reduction in pain    Status On-going      PT LONG TERM GOAL #3   Title Pt will report resolution in radiating pain in R UE    Baseline Said she has pain that radiates down her L UE but nothing in her R UE.    Status Achieved      PT LONG TERM GOAL #4   Title Pt will demo R shoulder ROM equivalent to L shoulder    Status On-going      PT LONG TERM GOAL #5   Title Pt will demo cervical ROM WFL with </=1/10 reports of pain    Status On-going                 Plan - 04/23/20 1656    Clinical Impression Statement Pt demos slight improvement in shoulder ROM from time of eval with the exception of R shoulder abduction. Pt still very limited in ROM and  strengthening ex's by pain/muscle guarding. Frequent cues for muscle relaxation with shoulder passive ROM this rx. Pt has appt with MD on Monday and reports she will follow up on shoulder issue.    PT Treatment/Interventions ADLs/Self Care Home Management;Electrical Stimulation;Iontophoresis 4mg /ml Dexamethasone;Moist Heat;Traction;Neuromuscular re-education;Therapeutic exercise;Therapeutic activities;Patient/family education;Manual techniques;Dry needling;Passive range of motion;Taping    PT Next Visit Plan UE/cervical flexibility, shoulder strengthening, manual/modalities as needed    Consulted and Agree with Plan of Care Patient           Patient will benefit from skilled therapeutic intervention in order to improve the following deficits and impairments:  Decreased range of motion, Increased muscle spasms, Impaired UE functional use, Decreased activity tolerance, Pain, Hypomobility, Impaired flexibility, Decreased strength, Impaired sensation, Postural dysfunction  Visit Diagnosis: Stiffness of right shoulder, not elsewhere classified  Acute pain of right shoulder  Cervicalgia  Cramp and spasm     Problem List Patient Active Problem List   Diagnosis Date Noted  . TIA (transient ischemic attack) 09/21/2017  . Chest pain 09/21/2017  . Hypertension 09/21/2017  . Hypokalemia 09/21/2017  . Abdominal pain, chronic, right upper quadrant 11/28/2013  . Family history of breast cancer in female 11/28/2013  . Nonspecific abnormal results of liver function study 06/29/2013  . Abdominal pain, right lower quadrant 06/29/2013  . Migraine headache 06/13/2013  . Dyspepsia and other specified disorders of function of stomach 04/10/2013  . Midepigastric pain 10/23/2012  . Unspecified constipation 10/23/2012   Amador Cunas, PT, DPT Donald Prose Jena Tegeler 04/23/2020, 4:58 PM  [place]. [place], [place], [address] Phone: [phone]    Fax:  [phone]  Name: MIRIA CAPPELLI MRN: [id number] Date of Birth: 1966-01-15

## 2020-04-28 ENCOUNTER — Encounter: Payer: Self-pay | Admitting: Physical Therapy

## 2020-04-28 ENCOUNTER — Other Ambulatory Visit: Payer: Self-pay

## 2020-04-28 ENCOUNTER — Ambulatory Visit: Payer: 59 | Admitting: Physical Therapy

## 2020-04-28 DIAGNOSIS — M25611 Stiffness of right shoulder, not elsewhere classified: Secondary | ICD-10-CM | POA: Diagnosis not present

## 2020-04-28 DIAGNOSIS — M25511 Pain in right shoulder: Secondary | ICD-10-CM | POA: Diagnosis not present

## 2020-04-28 DIAGNOSIS — R252 Cramp and spasm: Secondary | ICD-10-CM | POA: Diagnosis not present

## 2020-04-28 DIAGNOSIS — M542 Cervicalgia: Secondary | ICD-10-CM

## 2020-04-28 NOTE — Therapy (Signed)
[place]. [place], [place], [address] Phone: [phone]   Fax:  [phone]  Physical Therapy Treatment  Patient Details  Name: Stacey Mendoza MRN: [id number] Date of Birth: 21-Jan-1966 Referring Provider (PT): Rankins   Encounter Date: 04/28/2020   PT End of Session - 04/28/20 1654    Visit Number 7    Date for PT Re-Evaluation 05/31/20    PT Start Time 7209    PT Stop Time 4709    PT Time Calculation (min) 41 min    Activity Tolerance Patient limited by pain;Patient tolerated treatment well    Behavior During Therapy [facility] for tasks assessed/performed           Past Medical History:  Diagnosis Date  . Abdominal pain, chronic, right upper quadrant 11/28/2013  . Abdominal pain, right lower quadrant 06/29/2013  . Anemia   . Bulging disc    cervical MRI pending  . Chest pain 09/21/2017  . Dyspepsia and other specified disorders of function of stomach 04/10/2013  . Elevated LFTs   . Family history of breast cancer in female 11/28/2013  . Hypertension   . Hypokalemia 09/21/2017  . Midepigastric pain 10/23/2012  . Migraine headache 06/13/2013  . Nonspecific abnormal results of liver function study 06/29/2013  . Normal vaginal delivery 1992, 1986, 1990  . TIA (transient ischemic attack) 09/21/2017  . Unspecified constipation 10/23/2012    Past Surgical History:  Procedure Laterality Date  . CHOLECYSTECTOMY N/A 07/06/2017   Procedure: LAPAROSCOPIC CHOLECYSTECTOMY;  Surgeon: Kinsinger, Arta Bruce, MD;  Location: [facility];  Service: General;  Laterality: N/A;  . TUBAL LIGATION      There were no vitals filed for this visit.   Subjective Assessment - 04/28/20 1618    Subjective Pt reports no change; has follow up with MD next week.    Currently in Pain? Yes    Pain Score 3     Pain Location Shoulder    Pain Orientation Right    Pain Score 0    Pain Location Neck    Pain Orientation Left                              [facility] Adult PT Treatment/Exercise - 04/28/20 0001      Neck Exercises: Machines for Strengthening   UBE (Upper Arm Bike) L2 3 fwd/3bkwd    Nustep L5 x 6 min      Shoulder Exercises: Standing   External Rotation Right;10 reps;Strengthening    Theraband Level (Shoulder External Rotation) Level 2 (Red)    Internal Rotation 10 reps;Theraband;Strengthening;Right    Theraband Level (Shoulder Internal Rotation) Level 2 (Red)    Other Standing Exercises standing shoulder flex/abd x10 with 5 sec hold; shelf taps 1# flex/abd 2x5 RUE    Other Standing Exercises ABCs with scap protraction; scap stab 3 ways red TB      Shoulder Exercises: ROM/Strengthening   Other ROM/Strengthening Exercises triceps 20# 2x10; biceps 10# 2x10    Other ROM/Strengthening Exercises standing shoulder ext 10# 2x10                    PT Short Term Goals - 04/03/20 1759      PT SHORT TERM GOAL #1   Title Pt will be independent with initial HEP    Time 2    Period Weeks    Status Achieved    Target  Date 04/14/20             PT Long Term Goals - 04/09/20 1301      PT LONG TERM GOAL #1   Title Pt will be I with advanced HEP    Status Achieved      PT LONG TERM GOAL #2   Title Pt will report 50% reduction in R shoulder pain    Baseline No reduction in pain    Status On-going      PT LONG TERM GOAL #3   Title Pt will report resolution in radiating pain in R UE    Baseline Said she has pain that radiates down her L UE but nothing in her R UE.    Status Achieved      PT LONG TERM GOAL #4   Title Pt will demo R shoulder ROM equivalent to L shoulder    Status On-going      PT LONG TERM GOAL #5   Title Pt will demo cervical ROM WFL with </=1/10 reports of pain    Status On-going                 Plan - 04/28/20 1654    Clinical Impression Statement Pt required verbal and tactile cuing to avoid compensations. Demos difficulty esp with R shoulder  abduction. Pt does well with shoulder level ex's; struggles with reaching overhead. Reports feelings of weakness in LUE. Reports she will follow up with MD next week regarding shoulder.    PT Treatment/Interventions ADLs/Self Care Home Management;Electrical Stimulation;Iontophoresis 4mg /ml Dexamethasone;Moist Heat;Traction;Neuromuscular re-education;Therapeutic exercise;Therapeutic activities;Patient/family education;Manual techniques;Dry needling;Passive range of motion;Taping    PT Next Visit Plan UE/cervical flexibility, shoulder strengthening, manual/modalities as needed    Consulted and Agree with Plan of Care Patient           Patient will benefit from skilled therapeutic intervention in order to improve the following deficits and impairments:  Decreased range of motion,Increased muscle spasms,Impaired UE functional use,Decreased activity tolerance,Pain,Hypomobility,Impaired flexibility,Decreased strength,Impaired sensation,Postural dysfunction  Visit Diagnosis: Stiffness of right shoulder, not elsewhere classified  Acute pain of right shoulder  Cervicalgia  Cramp and spasm     Problem List Patient Active Problem List   Diagnosis Date Noted  . TIA (transient ischemic attack) 09/21/2017  . Chest pain 09/21/2017  . Hypertension 09/21/2017  . Hypokalemia 09/21/2017  . Abdominal pain, chronic, right upper quadrant 11/28/2013  . Family history of breast cancer in female 11/28/2013  . Nonspecific abnormal results of liver function study 06/29/2013  . Abdominal pain, right lower quadrant 06/29/2013  . Migraine headache 06/13/2013  . Dyspepsia and other specified disorders of function of stomach 04/10/2013  . Midepigastric pain 10/23/2012  . Unspecified constipation 10/23/2012   Amador Cunas, PT, DPT Donald Prose Bergen Melle 04/28/2020, 5:01 PM  [place]. [place], [place], [address] Phone: [phone]   Fax:   [phone]  Name: Stacey Mendoza MRN: [id number] Date of Birth: 08/04/1965

## 2020-05-01 ENCOUNTER — Encounter: Payer: Self-pay | Admitting: Physical Therapy

## 2020-05-01 ENCOUNTER — Ambulatory Visit: Payer: 59 | Admitting: Physical Therapy

## 2020-05-01 ENCOUNTER — Other Ambulatory Visit: Payer: Self-pay

## 2020-05-01 DIAGNOSIS — M25611 Stiffness of right shoulder, not elsewhere classified: Secondary | ICD-10-CM

## 2020-05-01 DIAGNOSIS — M25511 Pain in right shoulder: Secondary | ICD-10-CM | POA: Diagnosis not present

## 2020-05-01 DIAGNOSIS — M542 Cervicalgia: Secondary | ICD-10-CM | POA: Diagnosis not present

## 2020-05-01 DIAGNOSIS — R252 Cramp and spasm: Secondary | ICD-10-CM | POA: Diagnosis not present

## 2020-05-01 NOTE — Therapy (Signed)
[place]. [place], [place], [address] Phone: [phone]   Fax:  [phone]  Physical Therapy Treatment  Patient Details  Name: Stacey Mendoza MRN: [id number] Date of Birth: 1965-09-01 Referring Provider (PT): Rankins   Encounter Date: 05/01/2020   PT End of Session - 05/01/20 1648    Visit Number 8    Date for PT Re-Evaluation 05/31/20    PT Start Time 1600    PT Stop Time 1648    PT Time Calculation (min) 48 min    Activity Tolerance Patient tolerated treatment well;Patient limited by pain    Behavior During Therapy [facility] for tasks assessed/performed           Past Medical History:  Diagnosis Date  . Abdominal pain, chronic, right upper quadrant 11/28/2013  . Abdominal pain, right lower quadrant 06/29/2013  . Anemia   . Bulging disc    cervical MRI pending  . Chest pain 09/21/2017  . Dyspepsia and other specified disorders of function of stomach 04/10/2013  . Elevated LFTs   . Family history of breast cancer in female 11/28/2013  . Hypertension   . Hypokalemia 09/21/2017  . Midepigastric pain 10/23/2012  . Migraine headache 06/13/2013  . Nonspecific abnormal results of liver function study 06/29/2013  . Normal vaginal delivery 1992, 1986, 1990  . TIA (transient ischemic attack) 09/21/2017  . Unspecified constipation 10/23/2012    Past Surgical History:  Procedure Laterality Date  . CHOLECYSTECTOMY N/A 07/06/2017   Procedure: LAPAROSCOPIC CHOLECYSTECTOMY;  Surgeon: Kinsinger, Arta Bruce, MD;  Location: [facility];  Service: General;  Laterality: N/A;  . TUBAL LIGATION      There were no vitals filed for this visit.   Subjective Assessment - 05/01/20 1605    Subjective Arm only hurts when she moves it the wrong way.           0/10 pain                  [facility] Adult PT Treatment/Exercise - 05/01/20 0001      Neck Exercises: Machines for Strengthening   UBE (Upper Arm Bike) L1 3 fwd/3bkwd     Nustep L3 UE only x 3 min      Shoulder Exercises: Supine   Other Supine Exercises ER/IR 2lb abd 90 2x10      Shoulder Exercises: Standing   Flexion Strengthening;Both;20 reps;Weights    Shoulder Flexion Weight (lbs) 2    ABduction Strengthening;Both;Theraband    Shoulder ABduction Weight (lbs) 2    Row Both;Strengthening;20 reps   With end range ER   Theraband Level (Shoulder Row) Level 2 (Red)    Other Standing Exercises AAROM 2lb cane flex, ext, IR up back x10 each   Assist with Flex     Shoulder Exercises: ROM/Strengthening   Other ROM/Strengthening Exercises Rows 20lb 2x10                    PT Short Term Goals - 04/03/20 1759      PT SHORT TERM GOAL #1   Title Pt will be independent with initial HEP    Time 2    Period Weeks    Status Achieved    Target Date 04/14/20             PT Long Term Goals - 04/09/20 1301      PT LONG TERM GOAL #1   Title Pt will be I with advanced HEP  Status Achieved      PT LONG TERM GOAL #2   Title Pt will report 50% reduction in R shoulder pain    Baseline No reduction in pain    Status On-going      PT LONG TERM GOAL #3   Title Pt will report resolution in radiating pain in R UE    Baseline Said she has pain that radiates down her L UE but nothing in her R UE.    Status Achieved      PT LONG TERM GOAL #4   Title Pt will demo R shoulder ROM equivalent to L shoulder    Status On-going      PT LONG TERM GOAL #5   Title Pt will demo cervical ROM WFL with </=1/10 reports of pain    Status On-going                 Plan - 05/01/20 1650    Clinical Impression Statement Pt did well today completing all of the interventions. She does report pain throughout session at the end rand of motions. No shoulder elevation noted with flexion and abduction. Pain reported with manual therapy at eng ranged even the ones with soft end feel.    Personal Factors and Comorbidities Comorbidity 2    Comorbidities HTN, hx of  TIA affecting LLE    Examination-Activity Limitations Carry;Lift;Reach Overhead    Examination-Participation Restrictions Community Activity;Occupation;Interpersonal Relationship;Meal Prep    Stability/Clinical Decision Making Evolving/Moderate complexity    Rehab Potential Good    PT Frequency 2x / week    PT Duration 8 weeks    PT Treatment/Interventions ADLs/Self Care Home Management;Electrical Stimulation;Iontophoresis 4mg /ml Dexamethasone;Moist Heat;Traction;Neuromuscular re-education;Therapeutic exercise;Therapeutic activities;Patient/family education;Manual techniques;Dry needling;Passive range of motion;Taping    PT Next Visit Plan UE/cervical flexibility, shoulder strengthening, manual/modalities as needed           Patient will benefit from skilled therapeutic intervention in order to improve the following deficits and impairments:  Decreased range of motion,Increased muscle spasms,Impaired UE functional use,Decreased activity tolerance,Pain,Hypomobility,Impaired flexibility,Decreased strength,Impaired sensation,Postural dysfunction  Visit Diagnosis: Cervicalgia  Acute pain of right shoulder  Stiffness of right shoulder, not elsewhere classified     Problem List Patient Active Problem List   Diagnosis Date Noted  . TIA (transient ischemic attack) 09/21/2017  . Chest pain 09/21/2017  . Hypertension 09/21/2017  . Hypokalemia 09/21/2017  . Abdominal pain, chronic, right upper quadrant 11/28/2013  . Family history of breast cancer in female 11/28/2013  . Nonspecific abnormal results of liver function study 06/29/2013  . Abdominal pain, right lower quadrant 06/29/2013  . Migraine headache 06/13/2013  . Dyspepsia and other specified disorders of function of stomach 04/10/2013  . Midepigastric pain 10/23/2012  . Unspecified constipation 10/23/2012    Scot Jun 05/01/2020, 4:54 PM  [place]. [place], [place], [address] Phone: [phone]   Fax:  [phone]  Name: Stacey Mendoza MRN: [id number] Date of Birth: 1965-08-04

## 2020-05-05 ENCOUNTER — Encounter: Payer: Self-pay | Admitting: Physical Therapy

## 2020-05-05 ENCOUNTER — Other Ambulatory Visit: Payer: Self-pay

## 2020-05-05 ENCOUNTER — Ambulatory Visit: Payer: 59 | Admitting: Physical Therapy

## 2020-05-05 DIAGNOSIS — M542 Cervicalgia: Secondary | ICD-10-CM

## 2020-05-05 DIAGNOSIS — M25611 Stiffness of right shoulder, not elsewhere classified: Secondary | ICD-10-CM

## 2020-05-05 DIAGNOSIS — R252 Cramp and spasm: Secondary | ICD-10-CM | POA: Diagnosis not present

## 2020-05-05 DIAGNOSIS — M25511 Pain in right shoulder: Secondary | ICD-10-CM

## 2020-05-05 NOTE — Therapy (Signed)
[place]. [place], [place], [address] Phone: [phone]   Fax:  [phone]  Physical Therapy Treatment  Patient Details  Name: Stacey Mendoza MRN: [id number] Date of Birth: Apr 30, 1966 Referring Provider (PT): Rankins   Encounter Date: 05/05/2020   PT End of Session - 05/05/20 1659    Visit Number 9    Date for PT Re-Evaluation 05/31/20    PT Start Time 7591    PT Stop Time 1656    PT Time Calculation (min) 41 min    Activity Tolerance Patient tolerated treatment well;Patient limited by pain    Behavior During Therapy [facility] for tasks assessed/performed           Past Medical History:  Diagnosis Date  . Abdominal pain, chronic, right upper quadrant 11/28/2013  . Abdominal pain, right lower quadrant 06/29/2013  . Anemia   . Bulging disc    cervical MRI pending  . Chest pain 09/21/2017  . Dyspepsia and other specified disorders of function of stomach 04/10/2013  . Elevated LFTs   . Family history of breast cancer in female 11/28/2013  . Hypertension   . Hypokalemia 09/21/2017  . Midepigastric pain 10/23/2012  . Migraine headache 06/13/2013  . Nonspecific abnormal results of liver function study 06/29/2013  . Normal vaginal delivery 1992, 1986, 1990  . TIA (transient ischemic attack) 09/21/2017  . Unspecified constipation 10/23/2012    Past Surgical History:  Procedure Laterality Date  . CHOLECYSTECTOMY N/A 07/06/2017   Procedure: LAPAROSCOPIC CHOLECYSTECTOMY;  Surgeon: Kinsinger, Arta Bruce, MD;  Location: [facility];  Service: General;  Laterality: N/A;  . TUBAL LIGATION      There were no vitals filed for this visit.   Subjective Assessment - 05/05/20 1618    Subjective Pt reports R shoulder is 5/10 when she moves shoulder the wrong day but 0/10 pain right now    Currently in Pain? Yes    Pain Score 5     Pain Location Shoulder    Pain Orientation Right              [facility] PT Assessment - 05/05/20 0001       AROM   Right Shoulder Flexion 133 Degrees    Right Shoulder ABduction 105 Degrees    Right Shoulder Internal Rotation --   to L5   Right Shoulder External Rotation --   to C7                        [facility] Adult PT Treatment/Exercise - 05/05/20 0001      Neck Exercises: Machines for Strengthening   UBE (Upper Arm Bike) L1 3 fwd/3bkwd    Nustep L3 UE only x 3 min      Neck Exercises: Standing   Other Standing Exercises scap stab 3 ways red TB x5 B, yellow ball chest press and overhead raise 1x10    Other Standing Exercises shoulder flex/abd slides on wall x10      Shoulder Exercises: ROM/Strengthening   Other ROM/Strengthening Exercises rows and lats 20# 2x10; chest press 10# 2x10    Other ROM/Strengthening Exercises standing shoulder ext 10# 2x10; triceps 20# 2x10, biceps 10# 2x10                    PT Short Term Goals - 04/03/20 1759      PT SHORT TERM GOAL #1   Title Pt will be independent with initial  HEP    Time 2    Period Weeks    Status Achieved    Target Date 04/14/20             PT Long Term Goals - 04/09/20 1301      PT LONG TERM GOAL #1   Title Pt will be I with advanced HEP    Status Achieved      PT LONG TERM GOAL #2   Title Pt will report 50% reduction in R shoulder pain    Baseline No reduction in pain    Status On-going      PT LONG TERM GOAL #3   Title Pt will report resolution in radiating pain in R UE    Baseline Said she has pain that radiates down her L UE but nothing in her R UE.    Status Achieved      PT LONG TERM GOAL #4   Title Pt will demo R shoulder ROM equivalent to L shoulder    Status On-going      PT LONG TERM GOAL #5   Title Pt will demo cervical ROM WFL with </=1/10 reports of pain    Status On-going                 Plan - 05/05/20 1659    Clinical Impression Statement Pt has made minimal gains in R shoulder flexion/abduction ROM and no significant gains in R shoulder IR/ER. Still  c/o pain at end range manual therapy and with overhead reaching ex's. Pt will follow up with MD on Wednesday 12/22; plan to discuss continuation of PT and updates to HEP at next rx.    PT Treatment/Interventions ADLs/Self Care Home Management;Electrical Stimulation;Iontophoresis 4mg /ml Dexamethasone;Moist Heat;Traction;Neuromuscular re-education;Therapeutic exercise;Therapeutic activities;Patient/family education;Manual techniques;Dry needling;Passive range of motion;Taping    PT Next Visit Plan UE/cervical flexibility, shoulder strengthening, manual/modalities as needed    Consulted and Agree with Plan of Care Patient           Patient will benefit from skilled therapeutic intervention in order to improve the following deficits and impairments:  Decreased range of motion,Increased muscle spasms,Impaired UE functional use,Decreased activity tolerance,Pain,Hypomobility,Impaired flexibility,Decreased strength,Impaired sensation,Postural dysfunction  Visit Diagnosis: Cervicalgia  Acute pain of right shoulder  Stiffness of right shoulder, not elsewhere classified  Cramp and spasm     Problem List Patient Active Problem List   Diagnosis Date Noted  . TIA (transient ischemic attack) 09/21/2017  . Chest pain 09/21/2017  . Hypertension 09/21/2017  . Hypokalemia 09/21/2017  . Abdominal pain, chronic, right upper quadrant 11/28/2013  . Family history of breast cancer in female 11/28/2013  . Nonspecific abnormal results of liver function study 06/29/2013  . Abdominal pain, right lower quadrant 06/29/2013  . Migraine headache 06/13/2013  . Dyspepsia and other specified disorders of function of stomach 04/10/2013  . Midepigastric pain 10/23/2012  . Unspecified constipation 10/23/2012   Amador Cunas, PT, DPT Donald Prose Orma Cheetham 05/05/2020, 5:01 PM  [place]. [place], [place], [address] Phone: [phone]   Fax:   [phone]  Name: MARYGRACE SANDOVAL MRN: [id number] Date of Birth: 09/25/1965

## 2020-05-07 ENCOUNTER — Encounter: Payer: Self-pay | Admitting: Physical Therapy

## 2020-05-07 ENCOUNTER — Ambulatory Visit: Payer: 59 | Admitting: Physical Therapy

## 2020-05-07 ENCOUNTER — Other Ambulatory Visit (HOSPITAL_COMMUNITY): Payer: Self-pay | Admitting: Family Medicine

## 2020-05-07 ENCOUNTER — Other Ambulatory Visit: Payer: Self-pay

## 2020-05-07 DIAGNOSIS — M542 Cervicalgia: Secondary | ICD-10-CM | POA: Diagnosis not present

## 2020-05-07 DIAGNOSIS — K5901 Slow transit constipation: Secondary | ICD-10-CM | POA: Diagnosis not present

## 2020-05-07 DIAGNOSIS — R252 Cramp and spasm: Secondary | ICD-10-CM

## 2020-05-07 DIAGNOSIS — M25519 Pain in unspecified shoulder: Secondary | ICD-10-CM | POA: Diagnosis not present

## 2020-05-07 DIAGNOSIS — M25611 Stiffness of right shoulder, not elsewhere classified: Secondary | ICD-10-CM | POA: Diagnosis not present

## 2020-05-07 DIAGNOSIS — M25511 Pain in right shoulder: Secondary | ICD-10-CM

## 2020-05-07 DIAGNOSIS — R053 Chronic cough: Secondary | ICD-10-CM | POA: Diagnosis not present

## 2020-05-07 DIAGNOSIS — E7800 Pure hypercholesterolemia, unspecified: Secondary | ICD-10-CM | POA: Diagnosis not present

## 2020-05-07 DIAGNOSIS — I10 Essential (primary) hypertension: Secondary | ICD-10-CM | POA: Diagnosis not present

## 2020-05-07 MED FILL — ADVAIR 250/50 DISKUS: 250-50 | 30 days supply | Qty: 60 | Fill #0

## 2020-05-07 MED FILL — ATORVASTATIN CALCIUM 20 MG: 20 | 90 days supply | Qty: 90 | Fill #0

## 2020-05-07 MED FILL — ALBUTEROL SULFATE HFA 108 (: 108 (90 BAS | 25 days supply | Qty: 9 | Fill #0

## 2020-05-07 MED FILL — AMLODIPINE BESYLATE 10 MG T: 10 | 90 days supply | Qty: 90 | Fill #0

## 2020-05-07 NOTE — Therapy (Signed)
[address]. [place], [place], [address] Phone: [phone]   Fax:  [phone]  Physical Therapy Treatment Progress Note Reporting Period 03/31/2020 to 05/07/2020  See note below for Objective Data and Assessment of Progress/Goals.      Patient Details  Name: Stacey Mendoza MRN: [id number] Date of Birth: 08/03/1965 Referring Provider (PT): Rankins   Encounter Date: 05/07/2020   PT End of Session - 05/07/20 1140    Visit Number 10    Date for PT Re-Evaluation 05/31/20    PT Start Time 1108    PT Stop Time 1141    PT Time Calculation (min) 33 min    Activity Tolerance Patient tolerated treatment well;Patient limited by pain    Behavior During Therapy [facility] for tasks assessed/performed           Past Medical History:  Diagnosis Date  . Abdominal pain, chronic, right upper quadrant 11/28/2013  . Abdominal pain, right lower quadrant 06/29/2013  . Anemia   . Bulging disc    cervical MRI pending  . Chest pain 09/21/2017  . Dyspepsia and other specified disorders of function of stomach 04/10/2013  . Elevated LFTs   . Family history of breast cancer in female 11/28/2013  . Hypertension   . Hypokalemia 09/21/2017  . Midepigastric pain 10/23/2012  . Migraine headache 06/13/2013  . Nonspecific abnormal results of liver function study 06/29/2013  . Normal vaginal delivery 1992, 1986, 1990  . TIA (transient ischemic attack) 09/21/2017  . Unspecified constipation 10/23/2012    Past Surgical History:  Procedure Laterality Date  . CHOLECYSTECTOMY N/A 07/06/2017   Procedure: LAPAROSCOPIC CHOLECYSTECTOMY;  Surgeon: Kinsinger, Arta Bruce, MD;  Location: [facility];  Service: General;  Laterality: N/A;  . TUBAL LIGATION      There were no vitals filed for this visit.   Subjective Assessment - 05/07/20 1121    Subjective Pt reports that she will follow up with MD regarding R shoulder. Feels that she is not making much progress  in PT; would like to be placed on hold for now.    Currently in Pain? No/denies    Pain Score 0-No pain    Pain Location Shoulder    Pain Orientation Right              [facility] PT Assessment - 05/07/20 0001      AROM   Right Shoulder Flexion 131 Degrees    Right Shoulder ABduction 95 Degrees    Right Shoulder External Rotation 30 Degrees      PROM   Right Shoulder Flexion 135 Degrees    Right Shoulder ABduction 130 Degrees   painful     Special Tests    Special Tests Rotator Cuff Impingement    Rotator Cuff Impingment tests Michel Bickers test;Empty Can test;Painful Arc of Motion      Hawkins-Kennedy test   Findings Positive    Side Right      Empty Can test   Findings Positive    Side Right      Painful Arc of Motion   Findings Positive    Side Right                         [facility] Adult PT Treatment/Exercise - 05/07/20 0001      Self-Care   Self-Care Other Self-Care Comments    Other Self-Care Comments  educated on continuance of HEP, when to return to  clinic if symptoms worsen/recur, and stretching ex's for increased R shoulder mobility                    PT Short Term Goals - 04/03/20 1759      PT SHORT TERM GOAL #1   Title Pt will be independent with initial HEP    Time 2    Period Weeks    Status Achieved    Target Date 04/14/20             PT Long Term Goals - 05/07/20 1129      PT LONG TERM GOAL #1   Title Pt will be I with advanced HEP    Status Achieved      PT LONG TERM GOAL #2   Title Pt will report 50% reduction in R shoulder pain    Baseline No reduction in pain    Status On-going      PT LONG TERM GOAL #3   Title Pt will report resolution in radiating pain in R UE    Baseline Said she has pain that radiates down her L UE but nothing in her R UE.    Status Achieved      PT LONG TERM GOAL #4   Title Pt will demo R shoulder ROM equivalent to L shoulder    Status On-going      PT LONG TERM GOAL #5    Title Pt will demo cervical ROM WFL with </=1/10 reports of pain    Status On-going                 Plan - 05/07/20 1141    Clinical Impression Statement Pt arrived ~8 min late this rx. Discussed placing pt on hold with updated HEP this rx d/t pt feeling lack of progress and an objective lack of progress with R shoulder pain. Pt still demos limited R shoulder ROM all directions particularly in R shoulder abduction and external rotation. Pt demos positive Neer's, positive empty can test, and positive Hawkins-Kennedy. Pt does report improvement in N/T in RUE with only intermittent N/T in LUE remaining. Discussed with pt that cervical and shoulder issues may be separate. Pt would like to continue with HEP ex's and stretching at home and will call back for more appts if needed after following up with MD. Pt educated on updated POC and HEP with VU and agreement.    PT Treatment/Interventions ADLs/Self Care Home Management;Electrical Stimulation;Iontophoresis 4mg /ml Dexamethasone;Moist Heat;Traction;Neuromuscular re-education;Therapeutic exercise;Therapeutic activities;Patient/family education;Manual techniques;Dry needling;Passive range of motion;Taping    PT Next Visit Plan placed on hold with updated HEP    Consulted and Agree with Plan of Care Patient           Patient will benefit from skilled therapeutic intervention in order to improve the following deficits and impairments:  Decreased range of motion,Increased muscle spasms,Impaired UE functional use,Decreased activity tolerance,Pain,Hypomobility,Impaired flexibility,Decreased strength,Impaired sensation,Postural dysfunction  Visit Diagnosis: Cervicalgia  Acute pain of right shoulder  Stiffness of right shoulder, not elsewhere classified  Cramp and spasm     Problem List Patient Active Problem List   Diagnosis Date Noted  . TIA (transient ischemic attack) 09/21/2017  . Chest pain 09/21/2017  . Hypertension 09/21/2017  .  Hypokalemia 09/21/2017  . Abdominal pain, chronic, right upper quadrant 11/28/2013  . Family history of breast cancer in female 11/28/2013  . Nonspecific abnormal results of liver function study 06/29/2013  . Abdominal pain, right lower quadrant 06/29/2013  . Migraine  headache 06/13/2013  . Dyspepsia and other specified disorders of function of stomach 04/10/2013  . Midepigastric pain 10/23/2012  . Unspecified constipation 10/23/2012   Amador Cunas, PT, DPT Donald Prose Anushree Dorsi 05/07/2020, 11:48 AM  [place]. [place], [place], [address] Phone: [phone]   Fax:  [phone]  Name: Stacey Mendoza MRN: [id number] Date of Birth: [birth year]

## 2020-05-07 NOTE — Patient Instructions (Signed)
Access Code: [email or web address] URL: [email or web address] Date: 05/07/2020 Prepared by: Amador Cunas  Exercises Shoulder External Rotation and Scapular Retraction with Resistance - 1 x daily - 7 x weekly - 3 sets - 10 reps Standing Shoulder Row with Anchored Resistance - 1 x daily - 7 x weekly - 3 sets - 10 reps Shoulder Extension with Resistance - 1 x daily - 7 x weekly - 3 sets - 10 reps Shoulder Internal Rotation with Resistance - 1 x daily - 7 x weekly - 3 sets - 10 reps Shoulder External Rotation with Anchored Resistance - 1 x daily - 7 x weekly - 3 sets - 10 reps

## 2020-05-20 ENCOUNTER — Other Ambulatory Visit: Payer: Self-pay | Admitting: Family Medicine

## 2020-05-20 DIAGNOSIS — Z1231 Encounter for screening mammogram for malignant neoplasm of breast: Secondary | ICD-10-CM

## 2020-05-26 DIAGNOSIS — M25519 Pain in unspecified shoulder: Secondary | ICD-10-CM | POA: Diagnosis not present

## 2020-06-04 DIAGNOSIS — M7501 Adhesive capsulitis of right shoulder: Secondary | ICD-10-CM | POA: Diagnosis not present

## 2020-06-09 ENCOUNTER — Other Ambulatory Visit: Payer: Self-pay

## 2020-06-09 ENCOUNTER — Ambulatory Visit (INDEPENDENT_AMBULATORY_CARE_PROVIDER_SITE_OTHER): Payer: 59

## 2020-06-09 ENCOUNTER — Encounter: Payer: Self-pay | Admitting: Pulmonary Disease

## 2020-06-09 ENCOUNTER — Ambulatory Visit: Payer: 59 | Admitting: Pulmonary Disease

## 2020-06-09 VITALS — BP 122/84 | HR 83 | Temp 97.0°F | Ht 64.0 in | Wt 171.4 lb

## 2020-06-09 DIAGNOSIS — R059 Cough, unspecified: Secondary | ICD-10-CM

## 2020-06-09 DIAGNOSIS — R0602 Shortness of breath: Secondary | ICD-10-CM | POA: Diagnosis not present

## 2020-06-09 LAB — CBC WITH DIFFERENTIAL/PLATELET
Basophils Absolute: 0 10*3/uL (ref 0.0–0.1)
Basophils Relative: 0.5 % (ref 0.0–3.0)
Eosinophils Absolute: 0.1 10*3/uL (ref 0.0–0.7)
Eosinophils Relative: 1.1 % (ref 0.0–5.0)
HCT: 34.9 % — ABNORMAL LOW (ref 36.0–46.0)
Hemoglobin: 11.2 g/dL — ABNORMAL LOW (ref 12.0–15.0)
Lymphocytes Relative: 34.2 % (ref 12.0–46.0)
Lymphs Abs: 1.8 10*3/uL (ref 0.7–4.0)
MCHC: 32 g/dL (ref 30.0–36.0)
MCV: 70.3 fl — ABNORMAL LOW (ref 78.0–100.0)
Monocytes Absolute: 0.3 10*3/uL (ref 0.1–1.0)
Monocytes Relative: 6 % (ref 3.0–12.0)
Neutro Abs: 3.1 10*3/uL (ref 1.4–7.7)
Neutrophils Relative %: 58.2 % (ref 43.0–77.0)
Platelets: 185 10*3/uL (ref 150.0–400.0)
RBC: 4.96 Mil/uL (ref 3.87–5.11)
RDW: 16.8 % — ABNORMAL HIGH (ref 11.5–15.5)
WBC: 5.4 10*3/uL (ref 4.0–10.5)

## 2020-06-09 NOTE — Progress Notes (Signed)
Stacey Mendoza    [id number]    04/13/1966  Primary Care Physician:Rankins, Bill Salinas, MD  Referring Physician: Aretta Nip, [place],  [place] [address]  Chief complaint: Consult for cough, post COVID-54  HPI: 55 year old with history of hypertension, stroke Has history of cough when she was placed on lisinopril many years ago.  This improved after she was taken off this medication Developed COVID-19 in June 2019.  She did not require hospitalization.  However she had a recurrence of cough after the infection Cough is nonproductive in nature, no dyspnea, wheezing.  She has been started on albuterol and Advair by her primary care for possible reactive airway disease.  Symptoms are worse at night and on laying down Overall the cough is improving but she continues to have residual symptoms  No overt symptoms of allergy or acid reflux.  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  No feather pillows or comforter Smoking history: Never smoker Travel history: No significant travel history Relevant family history: No significant family history of lung disease   Outpatient Encounter Medications as of 06/09/2020  Medication Sig   ADVAIR DISKUS 250-50 MCG/DOSE AEPB 1 puff 2 (two) times daily.   amLODipine (NORVASC) 10 MG tablet Take 10 mg by mouth daily.   aspirin EC 81 MG EC tablet Take 1 tablet (81 mg total) by mouth daily.   atorvastatin (LIPITOR) 20 MG tablet Take 1 tablet (20 mg total) by mouth daily at 6 PM.   hydrochlorothiazide (MICROZIDE) 12.5 MG capsule Take 12.5 mg by mouth daily.   ibuprofen (ADVIL) 800 MG tablet Take 1 tablet (800 mg total) by mouth every 8 (eight) hours as needed.   LINZESS 290 MCG CAPS capsule    PROAIR HFA 108 (90 Base) MCG/ACT inhaler    No facility-administered encounter medications on file as of 06/09/2020.    Allergies as of  06/09/2020 - Review Complete 06/09/2020  Allergen Reaction Noted   Vicodin [hydrocodone-acetaminophen]  10/23/2012    Past Medical History:  Diagnosis Date   Abdominal pain, chronic, right upper quadrant 11/28/2013   Abdominal pain, right lower quadrant 06/29/2013   Anemia    Bulging disc    cervical MRI pending   Chest pain 09/21/2017   Dyspepsia and other specified disorders of function of stomach 04/10/2013   Elevated LFTs    Family history of breast cancer in female 11/28/2013   Hypertension    Hypokalemia 09/21/2017   Midepigastric pain 10/23/2012   Migraine headache 06/13/2013   Nonspecific abnormal results of liver function study 06/29/2013   Normal vaginal delivery 1992, 1986, 1990   TIA (transient ischemic attack) 09/21/2017   Unspecified constipation 10/23/2012    Past Surgical History:  Procedure Laterality Date   CHOLECYSTECTOMY N/A 07/06/2017   Procedure: LAPAROSCOPIC CHOLECYSTECTOMY;  Surgeon: Kinsinger, Arta Bruce, MD;  Location: [facility];  Service: General;  Laterality: N/A;   TUBAL LIGATION      Family History  Problem Relation Age of Onset   Heart disease Father        HEART ATTACK   Breast cancer Sister 11   Cancer Sister        LIVER   Hypertension Sister    Hypertension Mother    Diabetes Mother    Heart disease Mother    Throat cancer Mother    Hypertension Brother  Cancer Other 25       LIVER   Hypertension Sister    Hypertension Sister    Colon cancer Neg Hx     Social History   Socioeconomic History   Marital status: Single    Spouse name: Not on file   Number of children: 3   Years of education: Not on file   Highest education level: Not on file  Occupational History   Occupation: Customer service manager:   Tobacco Use   Smoking status: Never Smoker   Smokeless tobacco: Never Used  Scientific laboratory technician Use: Never used  Substance and Sexual Activity   Alcohol use: No   Drug use: No    Sexual activity: Yes    Partners: Male    Birth control/protection: Condom    Comment: Decline insurance questions  Other Topics Concern   Not on file  Social History Narrative   Lives alone   Caffeine use: No coffee, no soda   Herbal tea sometimes- no caffeine   Right handed    Social Determinants of Radio broadcast assistant Strain: Not on file  Food Insecurity: Not on file  Transportation Needs: Not on file  Physical Activity: Not on file  Stress: Not on file  Social Connections: Not on file  Intimate Partner Violence: Not on file    Review of systems: Review of Systems  Constitutional: Negative for fever and chills.  HENT: Negative.   Eyes: Negative for blurred vision.  Respiratory: as per HPI  Cardiovascular: Negative for chest pain and palpitations.  Gastrointestinal: Negative for vomiting, diarrhea, blood per rectum. Genitourinary: Negative for dysuria, urgency, frequency and hematuria.  Musculoskeletal: Negative for myalgias, back pain and joint pain.  Skin: Negative for itching and rash.  Neurological: Negative for dizziness, tremors, focal weakness, seizures and loss of consciousness.  Endo/Heme/Allergies: Negative for environmental allergies.  Psychiatric/Behavioral: Negative for depression, suicidal ideas and hallucinations.  All other systems reviewed and are negative.  Physical Exam: Blood pressure 122/84, pulse 83, temperature (!) 97 F (36.1 C), temperature source Temporal, height 5\' 4"  (1.626 m), weight 171 lb 6.4 oz (77.7 kg), SpO2 100 %. Gen:      No acute distress HEENT:  EOMI, sclera anicteric Neck:     No masses; no thyromegaly Lungs:    Clear to auscultation bilaterally; normal respiratory effort CV:         Regular rate and rhythm; no murmurs Abd:      + bowel sounds; soft, non-tender; no palpable masses, no distension Ext:    No edema; adequate peripheral perfusion Skin:      Warm and dry; no rash Neuro: alert and oriented x 3 Psych:  normal mood and affect  Data Reviewed: Imaging: Chest x-ray 09/20/2017-no active cardiopulmonary disease.  I have reviewed the images personally.  PFTs:  Labs: CBC 09/23/2017-WBC 6.3, eos 1%, absolute eosinophil count 63  Assessment:  Cough Suspect upper airway cough, GERD.  She may have component of reactive airway disease as advair is helping with symptoms She does have history of COVID-19 and will need evaluation for post Covid fibrosis.  Start Prilosec 20 mg at night for silent reflux Chest x-ray, CBC with differential, IgE for baseline evaluation Schedule pulmonary function test Continue Advair for now pending results of above tests.  Plan/Recommendations: CBC, IgE Chest x-ray, PFTs Prilosec 20 mg at night  Marshell Garfinkel MD [facility] 06/09/2020, 9:41 AM  CC: Rankins,  Fanny Dance, MD

## 2020-06-09 NOTE — Patient Instructions (Signed)
We will get CBC with differential, IgE, chest x-ray today Schedule pulmonary function test Continue Advair inhaler as prescribed Follow-up in 1 to 2 months.

## 2020-06-10 LAB — IGE: IgE (Immunoglobulin E), Serum: 38 kU/L (ref <=114)

## 2020-06-18 DIAGNOSIS — M67911 Unspecified disorder of synovium and tendon, right shoulder: Secondary | ICD-10-CM | POA: Diagnosis not present

## 2020-06-18 DIAGNOSIS — M25511 Pain in right shoulder: Secondary | ICD-10-CM | POA: Diagnosis not present

## 2020-06-19 ENCOUNTER — Other Ambulatory Visit: Payer: Self-pay | Admitting: Orthopedic Surgery

## 2020-06-19 DIAGNOSIS — M67911 Unspecified disorder of synovium and tendon, right shoulder: Secondary | ICD-10-CM

## 2020-06-20 MED FILL — LINZESS 290 MCG CAPSULE: 290 | 30 days supply | Qty: 30 | Fill #1

## 2020-06-23 ENCOUNTER — Encounter: Payer: Self-pay | Admitting: *Deleted

## 2020-07-01 MED FILL — ALBUTEROL SULFATE HFA 108 (: 108 (90 BAS | 25 days supply | Qty: 9 | Fill #1

## 2020-07-01 MED FILL — ADVAIR 250/50 DISKUS: 250-50 | 30 days supply | Qty: 60 | Fill #1

## 2020-07-03 ENCOUNTER — Other Ambulatory Visit: Payer: Self-pay

## 2020-07-03 ENCOUNTER — Ambulatory Visit
Admission: RE | Admit: 2020-07-03 | Discharge: 2020-07-03 | Disposition: A | Discharge status: Home / Self Care | Payer: 59 | Source: Ambulatory Visit | Attending: Family Medicine | Admitting: Family Medicine

## 2020-07-03 DIAGNOSIS — Z1231 Encounter for screening mammogram for malignant neoplasm of breast: Secondary | ICD-10-CM | POA: Diagnosis not present

## 2020-07-07 ENCOUNTER — Other Ambulatory Visit: Payer: Self-pay

## 2020-07-07 ENCOUNTER — Ambulatory Visit
Admission: RE | Admit: 2020-07-07 | Discharge: 2020-07-07 | Disposition: A | Discharge status: Home / Self Care | Payer: 59 | Source: Ambulatory Visit | Attending: Orthopedic Surgery | Admitting: Orthopedic Surgery

## 2020-07-07 DIAGNOSIS — M67911 Unspecified disorder of synovium and tendon, right shoulder: Secondary | ICD-10-CM

## 2020-07-07 DIAGNOSIS — M75121 Complete rotator cuff tear or rupture of right shoulder, not specified as traumatic: Secondary | ICD-10-CM | POA: Diagnosis not present

## 2020-07-08 ENCOUNTER — Other Ambulatory Visit: Payer: Self-pay | Admitting: Family Medicine

## 2020-07-08 DIAGNOSIS — R928 Other abnormal and inconclusive findings on diagnostic imaging of breast: Secondary | ICD-10-CM

## 2020-07-18 ENCOUNTER — Other Ambulatory Visit (HOSPITAL_COMMUNITY)
Admission: RE | Admit: 2020-07-18 | Discharge: 2020-07-18 | Disposition: A | Discharge status: Home / Self Care | Payer: 59 | Source: Ambulatory Visit | Attending: Pulmonary Disease | Admitting: Pulmonary Disease

## 2020-07-18 DIAGNOSIS — Z01812 Encounter for preprocedural laboratory examination: Secondary | ICD-10-CM | POA: Insufficient documentation

## 2020-07-18 DIAGNOSIS — Z20822 Contact with and (suspected) exposure to covid-19: Secondary | ICD-10-CM | POA: Insufficient documentation

## 2020-07-18 LAB — SARS CORONAVIRUS 2 (TAT 6-24 HRS): SARS Coronavirus 2: NEGATIVE

## 2020-07-22 ENCOUNTER — Ambulatory Visit (INDEPENDENT_AMBULATORY_CARE_PROVIDER_SITE_OTHER): Payer: 59 | Admitting: Pulmonary Disease

## 2020-07-22 ENCOUNTER — Ambulatory Visit: Payer: 59 | Admitting: Pulmonary Disease

## 2020-07-22 ENCOUNTER — Ambulatory Visit
Admission: RE | Admit: 2020-07-22 | Discharge: 2020-07-22 | Disposition: A | Discharge status: Home / Self Care | Payer: 59 | Source: Ambulatory Visit | Attending: Family Medicine | Admitting: Family Medicine

## 2020-07-22 ENCOUNTER — Other Ambulatory Visit: Payer: Self-pay

## 2020-07-22 ENCOUNTER — Encounter: Payer: Self-pay | Admitting: Pulmonary Disease

## 2020-07-22 VITALS — BP 128/76 | HR 87 | Temp 97.7°F | Ht 64.0 in | Wt 176.0 lb

## 2020-07-22 DIAGNOSIS — R059 Cough, unspecified: Secondary | ICD-10-CM

## 2020-07-22 DIAGNOSIS — R928 Other abnormal and inconclusive findings on diagnostic imaging of breast: Secondary | ICD-10-CM

## 2020-07-22 DIAGNOSIS — N6002 Solitary cyst of left breast: Secondary | ICD-10-CM | POA: Diagnosis not present

## 2020-07-22 LAB — PULMONARY FUNCTION TEST
DL/VA % pred: 130 %
DL/VA: 5.61 ml/min/mmHg/L
DLCO cor % pred: 104 %
DLCO cor: 21.91 ml/min/mmHg
DLCO unc % pred: 104 %
DLCO unc: 21.91 ml/min/mmHg
FEF 25-75 Post: 3.07 L/sec
FEF 25-75 Pre: 2.49 L/sec
FEF2575-%Change-Post: 23 %
FEF2575-%Pred-Post: 129 %
FEF2575-%Pred-Pre: 104 %
FEV1-%Change-Post: 9 %
FEV1-%Pred-Post: 106 %
FEV1-%Pred-Pre: 96 %
FEV1-Post: 2.42 L
FEV1-Pre: 2.2 L
FEV1FVC-%Change-Post: 5 %
FEV1FVC-%Pred-Pre: 103 %
FEV6-%Change-Post: 6 %
FEV6-%Pred-Post: 98 %
FEV6-%Pred-Pre: 92 %
FEV6-Post: 2.74 L
FEV6-Pre: 2.58 L
FEV6FVC-%Pred-Post: 102 %
FEV6FVC-%Pred-Pre: 102 %
FVC-%Change-Post: 4 %
FVC-%Pred-Post: 95 %
FVC-%Pred-Pre: 91 %
FVC-Post: 2.74 L
FVC-Pre: 2.61 L
Post FEV1/FVC ratio: 88 %
Post FEV6/FVC ratio: 100 %
Pre FEV1/FVC ratio: 84 %
Pre FEV6/FVC Ratio: 100 %
RV % pred: 73 %
RV: 1.34 L
TLC % pred: 77 %
TLC: 3.89 L

## 2020-07-22 NOTE — Progress Notes (Signed)
Full PFT performed today. °

## 2020-07-22 NOTE — Addendum Note (Signed)
Addended by: Merrilee Seashore on: 07/22/2020 12:17 PM   Modules accepted: Orders

## 2020-07-22 NOTE — Patient Instructions (Signed)
Pulmonary function test performed today. 

## 2020-07-22 NOTE — Progress Notes (Signed)
Stacey Mendoza    [id number]    1965/11/07  Primary Care Physician:Rankins, Bill Salinas, MD  Referring Physician: Aretta Nip, [place],  [place] [address]  Chief complaint: Follow-up for cough, post COVID-2  HPI: 55 year old with history of hypertension, stroke Has history of cough when she was placed on lisinopril many years ago.  This improved after she was taken off this medication Developed COVID-19 in June 2019.  She did not require hospitalization.  However she had a recurrence of cough after the infection Cough is nonproductive in nature, no dyspnea, wheezing.  She has been started on albuterol and Advair by her primary care for possible reactive airway disease.  Symptoms are worse at night and on laying down Overall the cough is improving but she continues to have residual symptoms  No overt symptoms of allergy or acid reflux.  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  No feather pillows or comforter Smoking history: Never smoker Travel history: No significant travel history Relevant family history: No significant family history of lung disease  Interim history: She is here for review of PFTs States that breathing is doing well Cough is overall improved   Outpatient Encounter Medications as of 07/22/2020  Medication Sig  . ADVAIR DISKUS 250-50 MCG/DOSE AEPB 1 puff 2 (two) times daily.  Marland Kitchen amLODipine (NORVASC) 10 MG tablet Take 10 mg by mouth daily.  Marland Kitchen aspirin EC 81 MG EC tablet Take 1 tablet (81 mg total) by mouth daily.  Marland Kitchen atorvastatin (LIPITOR) 20 MG tablet Take 1 tablet (20 mg total) by mouth daily at 6 PM.  . hydrochlorothiazide (MICROZIDE) 12.5 MG capsule Take 12.5 mg by mouth daily.  Marland Kitchen ibuprofen (ADVIL) 800 MG tablet Take 1 tablet (800 mg total) by mouth every 8 (eight) hours as needed.  Marland Kitchen LINZESS 290 MCG CAPS capsule   . PROAIR HFA 108 (90 Base)  MCG/ACT inhaler    No facility-administered encounter medications on file as of 07/22/2020.    Physical Exam: Blood pressure 122/84, pulse 83, temperature (!) 97 F (36.1 C), temperature source Temporal, height 5\' 4"  (1.626 m), weight 171 lb 6.4 oz (77.7 kg), SpO2 100 %. Gen:      No acute distress HEENT:  EOMI, sclera anicteric Neck:     No masses; no thyromegaly Lungs:    Clear to auscultation bilaterally; normal respiratory effort CV:         Regular rate and rhythm; no murmurs Abd:      + bowel sounds; soft, non-tender; no palpable masses, no distension Ext:    No edema; adequate peripheral perfusion Skin:      Warm and dry; no rash Neuro: alert and oriented x 3 Psych: normal mood and affect  Data Reviewed: Imaging: Chest x-ray 09/20/2017-no active cardiopulmonary disease.   Chest x-ray 06/09/2020-no acute cardiopulmonary abnormality I have reviewed the images personally.  PFTs: 07/22/2020 FVC 2.74 [95%], FEV1 2.42 [26%], F/F 88, TLC 3.89 [77%], DLCO 21.91 [94%] Minimal restriction  Labs: CBC 09/23/2017-WBC 6.3, eos 1%, absolute eosinophil count 63 CBC 06/09/2020-WBC 5.4, eos 1.1%, absolute eosinophil count 59  IgE 06/09/2020-38  Assessment:  Cough Suspect upper airway cough, GERD.   She does have history of COVID-19 but no evidence of post Covid fibrosis.  Chest x-ray looks okay, PFTs with minimal restriction likely related to body habitus No evidence of asthma on PFTs  or labs  She will continue PPI for reflux We will stop the Advair and see how she does off the inhaler Follow-up in 6 months  Plan/Recommendations: Discontinue advair and monitor symptoms.  Marshell Garfinkel MD [facility] 07/22/2020, 12:00 PM  CC: Rankins, Bill Salinas, MD

## 2020-07-22 NOTE — Patient Instructions (Signed)
I am glad you are doing well with your breathing I have reviewed your lung function which shows minimal changes.  Overall do not look concerning Chest x-ray and labs are normal as well  We will stop the Advair and see how you do Follow-up in 6 months

## 2020-07-30 DIAGNOSIS — M75111 Incomplete rotator cuff tear or rupture of right shoulder, not specified as traumatic: Secondary | ICD-10-CM | POA: Diagnosis not present

## 2020-08-05 MED FILL — HYDROCHLOROTHIAZIDE 12.5 MG: 12.5 | 90 days supply | Qty: 90 | Fill #0

## 2020-08-05 MED FILL — AMLODIPINE BESYLATE 10 MG T: 10 | 90 days supply | Qty: 90 | Fill #1

## 2020-08-05 MED FILL — ATORVASTATIN CALCIUM 20 MG: 20 | 90 days supply | Qty: 90 | Fill #1

## 2020-08-27 DIAGNOSIS — Z1210 Encounter for screening for malignant neoplasm of intestinal tract, unspecified: Secondary | ICD-10-CM | POA: Diagnosis not present

## 2020-08-27 DIAGNOSIS — R109 Unspecified abdominal pain: Secondary | ICD-10-CM | POA: Diagnosis not present

## 2020-09-05 DIAGNOSIS — H524 Presbyopia: Secondary | ICD-10-CM | POA: Diagnosis not present

## 2020-09-05 DIAGNOSIS — H52221 Regular astigmatism, right eye: Secondary | ICD-10-CM | POA: Diagnosis not present

## 2020-10-15 ENCOUNTER — Telehealth: Payer: Self-pay | Admitting: *Deleted

## 2020-10-15 NOTE — Telephone Encounter (Signed)
   [facility] Pre-operative Risk Assessment    Patient Name: Stacey Mendoza  DOB: [birth year]  MRN: [id number]   HEARTCARE STAFF: - Please ensure there is not already an duplicate clearance open for this procedure. - Under Visit Info/Reason for Call, type in Other and utilize the format Clearance MM/DD/YY or Clearance TBD. Do not use dashes or single digits. - If request is for dental extraction, please clarify the # of teeth to be extracted. - If the patient is currently at the dentist's office, call Pre-Op APP to address. If the patient is not currently in the dentist office, please route to the Pre-Op pool  Request for surgical clearance:  1. What type of surgery is being performed?  RIGHT SHOULDER ARTHROSCOPY, ROTATOR CUFF REPAIR, SUBACROMIAL DECOMPRESSION  2. When is this surgery scheduled?    11/04/2020  3. What type of clearance is required (medical clearance vs. Pharmacy clearance to hold med vs. Both)?  MEDICAL  4. Are there any medications that need to be held prior to surgery and how long? N/A   5. Practice name and name of physician performing surgery?  [facility]    6. What is the office phone number?  [phone]   7.   What is the office fax number?  [phone]  8.   Anesthesia type (None, local, MAC, general) ?  CHOICE   Jeanann Lewandowsky 10/15/2020, 11:33 AM  _________________________________________________________________   (provider comments below)

## 2020-10-15 NOTE — Telephone Encounter (Signed)
   Name: Stacey Mendoza  DOB: [birth year]  MRN: [id number]   Primary Cardiologist: Jenkins Rouge, MD  Chart reviewed as part of pre-operative protocol coverage. Patient was contacted 10/15/2020 in reference to pre-operative risk assessment for pending surgery as outlined below.  Stacey Mendoza was last seen on 03/24/20 by Dr. Johnsie Cancel.  Since that day, Stacey Mendoza has done well. She reports no chest pain, pressure, tightness. Reports no shortness of breath. She has exercise tolerance of >4 METS. Walks around 5 miles for exercise without difficulty.  Therefore, based on ACC/AHA guidelines, the patient would be at acceptable risk for the planned procedure without further cardiovascular testing.   She may hold Aspirin if needed prior to planned procedure.  The patient was advised that if she develops new symptoms prior to surgery to contact our office to arrange for a follow-up visit, and she verbalized understanding.  I will route this recommendation to the requesting party via Epic fax function and remove from pre-op pool. Please call with questions.  Loel Dubonnet, NP 10/15/2020, 11:45 AM

## 2020-10-17 ENCOUNTER — Encounter: Payer: Self-pay | Admitting: Pulmonary Disease

## 2020-10-17 NOTE — Progress Notes (Signed)
Note printed and faxed to [facility] [phone] attention Marcelo Baldy, surgical coordinator. Confirmation received.

## 2020-10-17 NOTE — Progress Notes (Signed)
PCCM note  Pulmonary office received clearance request for right shoulder arthroplasty and rotator cuff repair on 11/04/2020  She was last seen in pulmonary office on 07/22/2020 for upper airway cough, GERD. Pulmonary evaluation showed minimal restriction on PFTs with normal chest x-ray.  No evidence of asthma.  At last visit we had stopped advair.  I called and spoke with her today She is doing well off controller inhaler.  She is just using the albuterol occasionally with no increase in dyspnea or wheezing  She is at low risk for perioperative complications from surgery with no contraindication from a pulmonary standpoint   Marshell Garfinkel MD [facility] 10/17/2020, 9:37 AM

## 2020-11-03 ENCOUNTER — Other Ambulatory Visit (HOSPITAL_COMMUNITY): Payer: Self-pay

## 2020-11-03 MED ORDER — FLUTICASONE-SALMETEROL 250-50 MCG/ACT IN AEPB
1.0000 | INHALATION_SPRAY | Freq: Two times a day (BID) | RESPIRATORY_TRACT | 2 refills | Status: DC
  Filled 2020-11-03: qty 60, 30d supply, fill #0

## 2020-11-03 MED ORDER — ATORVASTATIN CALCIUM 20 MG PO TABS
20.0000 mg | ORAL_TABLET | Freq: Every day | ORAL | 0 refills | Status: DC
  Filled 2020-11-03: qty 90, 90d supply, fill #0

## 2020-11-03 MED ORDER — ALBUTEROL SULFATE HFA 108 (90 BASE) MCG/ACT IN AERS
2.0000 | INHALATION_SPRAY | Freq: Four times a day (QID) | RESPIRATORY_TRACT | 1 refills | Status: DC | PRN
  Filled 2020-11-03: qty 8.5, 25d supply, fill #0

## 2020-11-03 MED ORDER — AMLODIPINE BESYLATE 10 MG PO TABS
10.0000 mg | ORAL_TABLET | Freq: Every day | ORAL | 0 refills | Status: DC
  Filled 2020-11-03: qty 90, 90d supply, fill #0

## 2020-11-03 MED FILL — Hydrochlorothiazide Cap 12.5 MG: ORAL | 90 days supply | Qty: 90 | Fill #0 | Status: AC

## 2020-11-03 MED FILL — Linaclotide Cap 290 MCG: ORAL | 30 days supply | Qty: 30 | Fill #0 | Status: AC

## 2020-11-04 ENCOUNTER — Other Ambulatory Visit (HOSPITAL_COMMUNITY): Payer: Self-pay

## 2020-11-04 DIAGNOSIS — M75111 Incomplete rotator cuff tear or rupture of right shoulder, not specified as traumatic: Secondary | ICD-10-CM | POA: Diagnosis not present

## 2020-11-04 DIAGNOSIS — M7541 Impingement syndrome of right shoulder: Secondary | ICD-10-CM | POA: Diagnosis not present

## 2020-11-04 DIAGNOSIS — G8918 Other acute postprocedural pain: Secondary | ICD-10-CM | POA: Diagnosis not present

## 2020-11-04 DIAGNOSIS — M75101 Unspecified rotator cuff tear or rupture of right shoulder, not specified as traumatic: Secondary | ICD-10-CM | POA: Diagnosis not present

## 2020-11-04 DIAGNOSIS — S43431A Superior glenoid labrum lesion of right shoulder, initial encounter: Secondary | ICD-10-CM | POA: Diagnosis not present

## 2020-11-04 HISTORY — PX: ROTATOR CUFF REPAIR: SHX139

## 2020-11-04 MED ORDER — OXYCODONE-ACETAMINOPHEN 5-325 MG PO TABS
1.0000 | ORAL_TABLET | Freq: Four times a day (QID) | ORAL | 0 refills | Status: DC | PRN
  Filled 2020-11-04: qty 30, 4d supply, fill #0

## 2020-11-04 MED ORDER — TIZANIDINE HCL 2 MG PO TABS
2.0000 mg | ORAL_TABLET | Freq: Three times a day (TID) | ORAL | 0 refills | Status: DC | PRN
  Filled 2020-11-04: qty 21, 7d supply, fill #0

## 2020-11-07 ENCOUNTER — Encounter: Payer: Self-pay | Admitting: Obstetrics & Gynecology

## 2020-11-07 ENCOUNTER — Ambulatory Visit (INDEPENDENT_AMBULATORY_CARE_PROVIDER_SITE_OTHER): Payer: 59 | Admitting: Obstetrics & Gynecology

## 2020-11-07 ENCOUNTER — Other Ambulatory Visit: Payer: Self-pay

## 2020-11-07 VITALS — BP 138/90 | Ht 65.0 in | Wt 175.0 lb

## 2020-11-07 DIAGNOSIS — Z9889 Other specified postprocedural states: Secondary | ICD-10-CM

## 2020-11-07 DIAGNOSIS — N951 Menopausal and female climacteric states: Secondary | ICD-10-CM

## 2020-11-07 NOTE — Progress Notes (Signed)
Stacey Mendoza 1965/06/23 [id number]   History:    55 y.o. G3P3L3 Single/Boyfriend   RP: Established patient presenting for Annual/Gynecologic Exam and menopausal Sxs   HPI:  Just had a Rt Shoulder cuff surgery on 11/04/2020.  Under severe pain.  Has Rt arm in a shoulder strap.   Menses every 1-4 months with normal flow.  No BTB.  Menopausal Sxs with hot flushes/night sweats and vaginal dryness with IC.  Using condoms when sexually active.  No pelvic pain.  H/O Uterine Fibroids. Urine/BMs normal.  Breasts normal.  BMI 29.12.  Good fitness.  Colonoscopy done.  Health labs with Fam MD.  Past medical history,surgical history, family history and social history were all reviewed and documented in the EPIC chart.  Gynecologic History Patient's last menstrual period was 09/14/2020 (approximate).  Obstetric History OB History  Gravida Para Term Preterm AB Living  3 3   3   3   SAB IAB Ectopic Multiple Live Births               # Outcome Date GA Lbr Len/2nd Weight Sex Delivery Anes PTL Lv  3 Preterm           2 Preterm           1 Preterm              ROS: A ROS was performed and pertinent positives and negatives are included in the history.  GENERAL: No fevers or chills. HEENT: No change in vision, no earache, sore throat or sinus congestion. NECK: No pain or stiffness. CARDIOVASCULAR: No chest pain or pressure. No palpitations. PULMONARY: No shortness of breath, cough or wheeze. GASTROINTESTINAL: No abdominal pain, nausea, vomiting or diarrhea, melena or bright red blood per rectum. GENITOURINARY: No urinary frequency, urgency, hesitancy or dysuria. MUSCULOSKELETAL: No joint or muscle pain, no back pain, no recent trauma. DERMATOLOGIC: No rash, no itching, no lesions. ENDOCRINE: No polyuria, polydipsia, no heat or cold intolerance. No recent change in weight. HEMATOLOGICAL: No anemia or easy bruising or bleeding. NEUROLOGIC: No headache, seizures, numbness, tingling or weakness.  PSYCHIATRIC: No depression, no loss of interest in normal activity or change in sleep pattern.     Exam:   BP 138/90   Ht 5\' 5"  (1.651 m)   Wt 175 lb (79.4 kg)   LMP 09/14/2020 (Approximate)   BMI 29.12 kg/m   Body mass index is 29.12 kg/m.  General appearance : Well developed well nourished female. No acute distress HEENT: Eyes: no retinal hemorrhage or exudates,  Neck supple, trachea midline, no carotid bruits, no thyroidmegaly Lungs: Clear to auscultation, no rhonchi or wheezes, or rib retractions  Heart: Regular rate and rhythm, no murmurs or gallops Breast:Examined in sitting and supine position were symmetrical in appearance, no palpable masses or tenderness,  no skin retraction, no nipple inversion, no nipple discharge, no skin discoloration, no axillary or supraclavicular lymphadenopathy Abdomen: no palpable masses or tenderness, no rebound or guarding Extremities: no edema or skin discoloration or tenderness  Pelvic exam:  Under too much pain to undergo a Bimanual exam.   Assessment/Plan:  55 y.o. female for annual exam   1. Perimenopausal symptoms Oligomenorrhea every 3-4 months.  Worsening hot flushes, night sweats and vaginal dryness with IC.  Recommend PhytoEstrogens such as Black Cohosh or Estroven at this time.  2. S/P shoulder surgery  Will schedule Annual Gynecologic exam in 2 months when healed enough from shoulder surgery, with pain resolved, to undergo a  Gynecologic exam.  Princess Bruins MD, 3:12 PM 11/07/2020

## 2020-11-12 ENCOUNTER — Other Ambulatory Visit (HOSPITAL_COMMUNITY): Payer: Self-pay

## 2020-11-12 MED ORDER — TIZANIDINE HCL 2 MG PO TABS
2.0000 mg | ORAL_TABLET | Freq: Three times a day (TID) | ORAL | 0 refills | Status: DC | PRN
  Filled 2020-11-12: qty 20, 7d supply, fill #0

## 2020-11-12 MED ORDER — OXYCODONE-ACETAMINOPHEN 5-325 MG PO TABS
1.0000 | ORAL_TABLET | Freq: Four times a day (QID) | ORAL | 0 refills | Status: DC | PRN
  Filled 2020-11-12: qty 20, 5d supply, fill #0

## 2020-11-13 DIAGNOSIS — M75121 Complete rotator cuff tear or rupture of right shoulder, not specified as traumatic: Secondary | ICD-10-CM | POA: Diagnosis not present

## 2020-11-19 DIAGNOSIS — M75121 Complete rotator cuff tear or rupture of right shoulder, not specified as traumatic: Secondary | ICD-10-CM | POA: Diagnosis not present

## 2020-11-20 DIAGNOSIS — M75121 Complete rotator cuff tear or rupture of right shoulder, not specified as traumatic: Secondary | ICD-10-CM | POA: Diagnosis not present

## 2020-11-24 DIAGNOSIS — M75121 Complete rotator cuff tear or rupture of right shoulder, not specified as traumatic: Secondary | ICD-10-CM | POA: Diagnosis not present

## 2020-11-27 DIAGNOSIS — M75121 Complete rotator cuff tear or rupture of right shoulder, not specified as traumatic: Secondary | ICD-10-CM | POA: Diagnosis not present

## 2020-12-01 DIAGNOSIS — M75121 Complete rotator cuff tear or rupture of right shoulder, not specified as traumatic: Secondary | ICD-10-CM | POA: Diagnosis not present

## 2020-12-03 DIAGNOSIS — M75121 Complete rotator cuff tear or rupture of right shoulder, not specified as traumatic: Secondary | ICD-10-CM | POA: Diagnosis not present

## 2020-12-08 ENCOUNTER — Other Ambulatory Visit (HOSPITAL_COMMUNITY): Payer: Self-pay

## 2020-12-08 DIAGNOSIS — I10 Essential (primary) hypertension: Secondary | ICD-10-CM | POA: Diagnosis not present

## 2020-12-08 DIAGNOSIS — M75121 Complete rotator cuff tear or rupture of right shoulder, not specified as traumatic: Secondary | ICD-10-CM | POA: Diagnosis not present

## 2020-12-08 DIAGNOSIS — R21 Rash and other nonspecific skin eruption: Secondary | ICD-10-CM | POA: Diagnosis not present

## 2020-12-08 MED ORDER — HYDROCHLOROTHIAZIDE 25 MG PO TABS
25.0000 mg | ORAL_TABLET | Freq: Every morning | ORAL | 1 refills | Status: DC
  Filled 2020-12-08: qty 90, 90d supply, fill #0
  Filled 2021-05-08: qty 90, 90d supply, fill #1

## 2020-12-08 MED ORDER — TRIAMCINOLONE ACETONIDE 0.1 % EX CREA
TOPICAL_CREAM | CUTANEOUS | 0 refills | Status: DC
  Filled 2020-12-08: qty 60, 30d supply, fill #0

## 2020-12-11 DIAGNOSIS — M75121 Complete rotator cuff tear or rupture of right shoulder, not specified as traumatic: Secondary | ICD-10-CM | POA: Diagnosis not present

## 2020-12-16 DIAGNOSIS — M75121 Complete rotator cuff tear or rupture of right shoulder, not specified as traumatic: Secondary | ICD-10-CM | POA: Diagnosis not present

## 2020-12-18 DIAGNOSIS — M75121 Complete rotator cuff tear or rupture of right shoulder, not specified as traumatic: Secondary | ICD-10-CM | POA: Diagnosis not present

## 2020-12-22 DIAGNOSIS — M75121 Complete rotator cuff tear or rupture of right shoulder, not specified as traumatic: Secondary | ICD-10-CM | POA: Diagnosis not present

## 2020-12-24 DIAGNOSIS — M75121 Complete rotator cuff tear or rupture of right shoulder, not specified as traumatic: Secondary | ICD-10-CM | POA: Diagnosis not present

## 2020-12-29 DIAGNOSIS — M75121 Complete rotator cuff tear or rupture of right shoulder, not specified as traumatic: Secondary | ICD-10-CM | POA: Diagnosis not present

## 2021-01-01 DIAGNOSIS — M75121 Complete rotator cuff tear or rupture of right shoulder, not specified as traumatic: Secondary | ICD-10-CM | POA: Diagnosis not present

## 2021-01-05 DIAGNOSIS — M75121 Complete rotator cuff tear or rupture of right shoulder, not specified as traumatic: Secondary | ICD-10-CM | POA: Diagnosis not present

## 2021-01-08 DIAGNOSIS — M75121 Complete rotator cuff tear or rupture of right shoulder, not specified as traumatic: Secondary | ICD-10-CM | POA: Diagnosis not present

## 2021-01-13 DIAGNOSIS — M75121 Complete rotator cuff tear or rupture of right shoulder, not specified as traumatic: Secondary | ICD-10-CM | POA: Diagnosis not present

## 2021-01-21 DIAGNOSIS — M75121 Complete rotator cuff tear or rupture of right shoulder, not specified as traumatic: Secondary | ICD-10-CM | POA: Diagnosis not present

## 2021-01-23 DIAGNOSIS — M75121 Complete rotator cuff tear or rupture of right shoulder, not specified as traumatic: Secondary | ICD-10-CM | POA: Diagnosis not present

## 2021-01-26 DIAGNOSIS — M75121 Complete rotator cuff tear or rupture of right shoulder, not specified as traumatic: Secondary | ICD-10-CM | POA: Diagnosis not present

## 2021-02-02 DIAGNOSIS — M75121 Complete rotator cuff tear or rupture of right shoulder, not specified as traumatic: Secondary | ICD-10-CM | POA: Diagnosis not present

## 2021-02-05 DIAGNOSIS — M75121 Complete rotator cuff tear or rupture of right shoulder, not specified as traumatic: Secondary | ICD-10-CM | POA: Diagnosis not present

## 2021-02-09 DIAGNOSIS — M75121 Complete rotator cuff tear or rupture of right shoulder, not specified as traumatic: Secondary | ICD-10-CM | POA: Diagnosis not present

## 2021-02-12 DIAGNOSIS — M75121 Complete rotator cuff tear or rupture of right shoulder, not specified as traumatic: Secondary | ICD-10-CM | POA: Diagnosis not present

## 2021-02-17 DIAGNOSIS — M75121 Complete rotator cuff tear or rupture of right shoulder, not specified as traumatic: Secondary | ICD-10-CM | POA: Diagnosis not present

## 2021-02-23 ENCOUNTER — Other Ambulatory Visit (HOSPITAL_COMMUNITY): Payer: Self-pay

## 2021-02-23 DIAGNOSIS — M75121 Complete rotator cuff tear or rupture of right shoulder, not specified as traumatic: Secondary | ICD-10-CM | POA: Diagnosis not present

## 2021-02-23 MED ORDER — AMLODIPINE BESYLATE 10 MG PO TABS
10.0000 mg | ORAL_TABLET | Freq: Every day | ORAL | 1 refills | Status: DC
  Filled 2021-02-23: qty 90, 90d supply, fill #0
  Filled 2021-06-01: qty 90, 90d supply, fill #1

## 2021-02-23 MED ORDER — ATORVASTATIN CALCIUM 20 MG PO TABS
20.0000 mg | ORAL_TABLET | Freq: Every day | ORAL | 1 refills | Status: DC
  Filled 2021-02-23: qty 90, 90d supply, fill #0
  Filled 2021-08-10: qty 90, 90d supply, fill #1

## 2021-02-23 MED FILL — Linaclotide Cap 290 MCG: ORAL | 30 days supply | Qty: 30 | Fill #1 | Status: AC

## 2021-02-24 ENCOUNTER — Other Ambulatory Visit (HOSPITAL_COMMUNITY): Payer: Self-pay

## 2021-02-24 MED ORDER — AMLODIPINE BESYLATE 10 MG PO TABS: 10.0000 mg | ORAL_TABLET | Freq: Every day | ORAL | 0 refills | Status: DC

## 2021-02-24 MED ORDER — ATORVASTATIN CALCIUM 20 MG PO TABS: 20.0000 mg | ORAL_TABLET | Freq: Every day | ORAL | 0 refills | Status: DC

## 2021-02-27 DIAGNOSIS — M75121 Complete rotator cuff tear or rupture of right shoulder, not specified as traumatic: Secondary | ICD-10-CM | POA: Diagnosis not present

## 2021-03-02 DIAGNOSIS — R053 Chronic cough: Secondary | ICD-10-CM | POA: Diagnosis not present

## 2021-03-02 DIAGNOSIS — E7800 Pure hypercholesterolemia, unspecified: Secondary | ICD-10-CM | POA: Diagnosis not present

## 2021-03-02 DIAGNOSIS — I10 Essential (primary) hypertension: Secondary | ICD-10-CM | POA: Diagnosis not present

## 2021-03-02 DIAGNOSIS — K5901 Slow transit constipation: Secondary | ICD-10-CM | POA: Diagnosis not present

## 2021-03-03 DIAGNOSIS — M75121 Complete rotator cuff tear or rupture of right shoulder, not specified as traumatic: Secondary | ICD-10-CM | POA: Diagnosis not present

## 2021-03-05 DIAGNOSIS — M75121 Complete rotator cuff tear or rupture of right shoulder, not specified as traumatic: Secondary | ICD-10-CM | POA: Diagnosis not present

## 2021-03-10 DIAGNOSIS — M75121 Complete rotator cuff tear or rupture of right shoulder, not specified as traumatic: Secondary | ICD-10-CM | POA: Diagnosis not present

## 2021-03-13 DIAGNOSIS — M75121 Complete rotator cuff tear or rupture of right shoulder, not specified as traumatic: Secondary | ICD-10-CM | POA: Diagnosis not present

## 2021-03-16 ENCOUNTER — Ambulatory Visit: Payer: 59 | Admitting: Pulmonary Disease

## 2021-03-16 ENCOUNTER — Other Ambulatory Visit: Payer: Self-pay

## 2021-03-16 ENCOUNTER — Encounter: Payer: Self-pay | Admitting: Pulmonary Disease

## 2021-03-16 VITALS — BP 126/68 | HR 83 | Ht 64.0 in | Wt 186.4 lb

## 2021-03-16 DIAGNOSIS — M25511 Pain in right shoulder: Secondary | ICD-10-CM | POA: Diagnosis not present

## 2021-03-16 DIAGNOSIS — R059 Cough, unspecified: Secondary | ICD-10-CM

## 2021-03-16 DIAGNOSIS — M75121 Complete rotator cuff tear or rupture of right shoulder, not specified as traumatic: Secondary | ICD-10-CM | POA: Diagnosis not present

## 2021-03-16 NOTE — Patient Instructions (Signed)
Glad you are doing well with regard to your breathing Continue to work on weight loss Continue off advair inhaler Follow-up in 6 months

## 2021-03-16 NOTE — Progress Notes (Signed)
Stacey Mendoza    [id number]    Jun 06, 1965  Primary Care Physician:Rankins, Bill Salinas, MD  Referring Physician: Aretta Nip, [place],  [place] [address]  Chief complaint: Follow-up for cough, post COVID-46  HPI: 55 year old with history of hypertension, stroke Has history of cough when she was placed on lisinopril many years ago.  This improved after she was taken off this medication Developed COVID-19 in June 2019.  She did not require hospitalization.  However she had a recurrence of cough after the infection Cough is nonproductive in nature, no dyspnea, wheezing.  She has been started on albuterol and Advair by her primary care for possible reactive airway disease.  Symptoms are worse at night and on laying down Overall the cough is improving but she continues to have residual symptoms  No overt symptoms of allergy or acid reflux.  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  No feather pillows or comforter Smoking history: Never smoker Travel history: No significant travel history Relevant family history: No significant family history of lung disease  Interim history: Has been off advair inhaler for the past 6 months.  Overall she is doing well but has noticed some dyspnea on exertion recently which she attributes to weight gain  On albuterol which she uses several times a week.  Underwent right shoulder arthroplasty in June 2022 without any respiratory issue.   Outpatient Encounter Medications as of 03/16/2021  Medication Sig   albuterol (PROAIR HFA) 108 (90 Base) MCG/ACT inhaler Inhale 2 puffs into the lungs every 6 (six) hours as needed for cough or wheezing.   amLODipine (NORVASC) 10 MG tablet Take 1 tablet (10 mg total) by mouth daily.   amLODipine (NORVASC) 10 MG tablet Take 1 tablet (10 mg total) by mouth daily.   aspirin EC 81 MG EC tablet Take 1  tablet (81 mg total) by mouth daily.   atorvastatin (LIPITOR) 20 MG tablet Take 1 tablet (20 mg total) by mouth daily at 6 PM.   atorvastatin (LIPITOR) 20 MG tablet Take 1 tablet (20 mg total) by mouth daily at 6 PM.   fluticasone-salmeterol (ADVAIR) 250-50 MCG/ACT AEPB Inhale 1 puff into the lungs 2 (two) times daily. Rinse mouth and spit after use.   hydrochlorothiazide (HYDRODIURIL) 25 MG tablet Take 1 tablet (25 mg total) by mouth in the morning.   ibuprofen (ADVIL) 800 MG tablet Take 1 tablet (800 mg total) by mouth every 8 (eight) hours as needed.   linaclotide (LINZESS) 290 MCG CAPS capsule TAKE 1 CAPSULE BY MOUTH ONCE A DAY WITH FIRST MEAL   LINZESS 290 MCG CAPS capsule    oxyCODONE-acetaminophen (PERCOCET/ROXICET) 5-325 MG tablet Take 1 tablet by mouth every 6 (six) hours as needed for pain   tiZANidine (ZANAFLEX) 2 MG tablet Take 1 tablet (2 mg total) by mouth every 8 (eight) hours as needed for muscle pain   triamcinolone cream (KENALOG) 0.1 % Apply 1 application to rash externally 2 times daily until rash resolves   [DISCONTINUED] Fluticasone-Salmeterol (ADVAIR) 250-50 MCG/DOSE AEPB INHALE 1 PUFF BY MOUTH TWICE A DAY;RINSE MOUTH AND SPIT AFTER USE   No facility-administered encounter medications on file as of 03/16/2021.    Physical Exam: Blood pressure 126/68, pulse 83, height 5\' 4"  (1.626 m), weight 186 lb 6.4 oz (84.6 kg), SpO2 99 %. Gen:      No acute  distress HEENT:  EOMI, sclera anicteric Neck:     No masses; no thyromegaly Lungs:    Clear to auscultation bilaterally; normal respiratory effort CV:         Regular rate and rhythm; no murmurs Abd:      + bowel sounds; soft, non-tender; no palpable masses, no distension Ext:    No edema; adequate peripheral perfusion Skin:      Warm and dry; no rash Neuro: alert and oriented x 3 Psych: normal mood and affect   Data Reviewed: Imaging: Chest x-ray 09/20/2017-no active cardiopulmonary disease.   Chest x-ray 06/09/2020-no  acute cardiopulmonary abnormality I have reviewed the images personally.  PFTs: 07/22/2020 FVC 2.74 [95%], FEV1 2.42 [26%], F/F 88, TLC 3.89 [77%], DLCO 21.91 [94%] Minimal restriction  Labs: CBC 09/23/2017-WBC 6.3, eos 1%, absolute eosinophil count 63 CBC 06/09/2020-WBC 5.4, eos 1.1%, absolute eosinophil count 59  IgE 06/09/2020-38  Assessment:  Cough Suspect upper airway cough, GERD.   She does have history of COVID-19 but no evidence of post Covid fibrosis.  Chest x-ray looks okay, PFTs with minimal restriction likely related to body habitus No evidence of asthma on PFTs or labs  She will continue PPI for reflux Continue to observe off advair inhaler Will need to work on weight loss and exercise Follow-up in 6 months.  If she is still stable at that point then consider discharge from pulmonary clinic  Plan/Recommendations: Weight loss with diet and exercise Follow-up in 6 months  Marshell Garfinkel MD [facility] 03/16/2021, 12:13 PM  CC: Rankins, Bill Salinas, MD

## 2021-03-19 DIAGNOSIS — M75121 Complete rotator cuff tear or rupture of right shoulder, not specified as traumatic: Secondary | ICD-10-CM | POA: Diagnosis not present

## 2021-03-23 DIAGNOSIS — M75121 Complete rotator cuff tear or rupture of right shoulder, not specified as traumatic: Secondary | ICD-10-CM | POA: Diagnosis not present

## 2021-04-08 DIAGNOSIS — M75121 Complete rotator cuff tear or rupture of right shoulder, not specified as traumatic: Secondary | ICD-10-CM | POA: Diagnosis not present

## 2021-04-09 ENCOUNTER — Emergency Department (HOSPITAL_BASED_OUTPATIENT_CLINIC_OR_DEPARTMENT_OTHER)
Admission: EM | Admit: 2021-04-09 | Discharge: 2021-04-09 | Disposition: A | Discharge status: Home / Self Care | Payer: 59 | Attending: Emergency Medicine | Admitting: Emergency Medicine

## 2021-04-09 ENCOUNTER — Encounter (HOSPITAL_BASED_OUTPATIENT_CLINIC_OR_DEPARTMENT_OTHER): Payer: Self-pay | Admitting: *Deleted

## 2021-04-09 ENCOUNTER — Other Ambulatory Visit: Payer: Self-pay

## 2021-04-09 DIAGNOSIS — L509 Urticaria, unspecified: Secondary | ICD-10-CM | POA: Diagnosis not present

## 2021-04-09 DIAGNOSIS — I10 Essential (primary) hypertension: Secondary | ICD-10-CM | POA: Insufficient documentation

## 2021-04-09 DIAGNOSIS — Z79899 Other long term (current) drug therapy: Secondary | ICD-10-CM | POA: Diagnosis not present

## 2021-04-09 DIAGNOSIS — Z7982 Long term (current) use of aspirin: Secondary | ICD-10-CM | POA: Diagnosis not present

## 2021-04-09 DIAGNOSIS — R000 Tachycardia, unspecified: Secondary | ICD-10-CM | POA: Insufficient documentation

## 2021-04-09 MED ORDER — METHYLPREDNISOLONE SODIUM SUCC 125 MG IJ SOLR
125.0000 mg | Freq: Once | INTRAMUSCULAR | Status: AC
  Administered 2021-04-09: 125 mg via INTRAVENOUS
  Filled 2021-04-09: qty 2

## 2021-04-09 MED ORDER — FAMOTIDINE 20 MG PO TABS: 20.0000 mg | ORAL_TABLET | Freq: Every day | ORAL | 0 refills | Status: DC

## 2021-04-09 MED ORDER — LORATADINE 10 MG PO TABS
10.0000 mg | ORAL_TABLET | Freq: Every day | ORAL | 0 refills | Status: DC
  Filled 2021-04-09 – 2021-04-10 (×2): qty 7, 7d supply, fill #0

## 2021-04-09 MED ORDER — SODIUM CHLORIDE 0.9 % IV BOLUS
500.0000 mL | Freq: Once | INTRAVENOUS | Status: AC
  Administered 2021-04-09: 500 mL via INTRAVENOUS

## 2021-04-09 MED ORDER — PREDNISONE 10 MG PO TABS
30.0000 mg | ORAL_TABLET | Freq: Every day | ORAL | 0 refills | Status: AC
  Filled 2021-04-09 – 2021-04-10 (×2): qty 15, 5d supply, fill #0

## 2021-04-09 MED ORDER — FAMOTIDINE IN NACL 20-0.9 MG/50ML-% IV SOLN
20.0000 mg | Freq: Once | INTRAVENOUS | Status: AC
  Administered 2021-04-09: 20 mg via INTRAVENOUS
  Filled 2021-04-09: qty 50

## 2021-04-09 MED ORDER — FAMOTIDINE 20 MG PO TABS
20.0000 mg | ORAL_TABLET | Freq: Every day | ORAL | 0 refills | Status: DC
  Filled 2021-04-09 – 2021-04-10 (×2): qty 7, 7d supply, fill #0

## 2021-04-09 MED ORDER — LORATADINE 10 MG PO TABS: 10.0000 mg | ORAL_TABLET | Freq: Every day | ORAL | 0 refills | Status: DC

## 2021-04-09 MED ORDER — DIPHENHYDRAMINE HCL 50 MG/ML IJ SOLN
50.0000 mg | Freq: Once | INTRAMUSCULAR | Status: AC
  Administered 2021-04-09: 50 mg via INTRAVENOUS
  Filled 2021-04-09: qty 1

## 2021-04-09 MED ORDER — PREDNISONE 10 MG PO TABS: 30.0000 mg | ORAL_TABLET | Freq: Every day | ORAL | 0 refills | Status: DC

## 2021-04-09 NOTE — ED Provider Notes (Signed)
[facility] EMERGENCY DEPARTMENT Provider Note   CSN: [id number] Arrival date & time: 04/09/21  1837     History Chief Complaint  Patient presents with   Urticaria    Stacey Mendoza is a 55 y.o. female.  HPI Patient with no significant medical history presents to the emergency department with chief complaint of hives.  Patient states this started last night, she states that she was taking a bath got out and noted that she started develop hives, states it was on her legs and has spread all over her body.  She states that she is taking some Benadryl which has not really helped with that.  She states the rash is very itchy,  none painful, denies any lip, throat, tongue swelling, difficulty breathing, swallowing, trismus or torticollis.  States that she denies any new medications, new detergents, new pets, new environments, she does states that she had [employer] yesterday and had a salad dressings that she typically does not have.  She never had this in the past.  She has no other complaints.  She denies  fevers, chills, chest pain, shortness breath, abdominal pain, nausea, vomit, diarrhea.    Past Medical History:  Diagnosis Date   Abdominal pain, chronic, right upper quadrant 11/28/2013   Abdominal pain, right lower quadrant 06/29/2013   Anemia    Bulging disc    cervical MRI pending   Chest pain 09/21/2017   Dyspepsia and other specified disorders of function of stomach 04/10/2013   Elevated LFTs    Family history of breast cancer in female 11/28/2013   Hypertension    Hypokalemia 09/21/2017   Midepigastric pain 10/23/2012   Migraine headache 06/13/2013   Nonspecific abnormal results of liver function study 06/29/2013   Normal vaginal delivery 1992, 1986, 1990   TIA (transient ischemic attack) 09/21/2017   Unspecified constipation 10/23/2012    Patient Active Problem List   Diagnosis Date Noted   TIA (transient ischemic attack) 09/21/2017   Chest pain 09/21/2017   Hypertension  09/21/2017   Hypokalemia 09/21/2017   Abdominal pain, chronic, right upper quadrant 11/28/2013   Family history of breast cancer in female 11/28/2013   Nonspecific abnormal results of liver function study 06/29/2013   Abdominal pain, right lower quadrant 06/29/2013   Migraine headache 06/13/2013   Dyspepsia and other specified disorders of function of stomach 04/10/2013   Midepigastric pain 10/23/2012   Unspecified constipation 10/23/2012    Past Surgical History:  Procedure Laterality Date   CHOLECYSTECTOMY N/A 07/06/2017   Procedure: LAPAROSCOPIC CHOLECYSTECTOMY;  Surgeon: Kinsinger, Arta Bruce, MD;  Location: [facility];  Service: General;  Laterality: N/A;   ROTATOR CUFF REPAIR Right 11/04/2020   TUBAL LIGATION       OB History     Gravida  3   Para  3   Term      Preterm  3   AB      Living  3      SAB      IAB      Ectopic      Multiple      Live Births              Family History  Problem Relation Age of Onset   Heart disease Father        HEART ATTACK   Breast cancer Sister 36   Cancer Sister        LIVER   Hypertension Sister    Hypertension Mother  Diabetes Mother    Heart disease Mother    Throat cancer Mother    Hypertension Brother    Cancer Other 14       LIVER   Hypertension Sister    Hypertension Sister    Colon cancer Neg Hx     Social History   Tobacco Use   Smoking status: Never   Smokeless tobacco: Never  Vaping Use   Vaping Use: Never used  Substance Use Topics   Alcohol use: No   Drug use: No    Home Medications Prior to Admission medications   Medication Sig Start Date End Date Taking? Authorizing Provider  famotidine (PEPCID) 20 MG tablet Take 1 tablet (20 mg total) by mouth daily for 7 days. 04/09/21 04/16/21 Yes Marcello Fennel, PA-C  loratadine (CLARITIN) 10 MG tablet Take 1 tablet (10 mg total) by mouth daily for 7 days. 04/09/21 04/16/21 Yes Marcello Fennel, PA-C  predniSONE (DELTASONE) 10 MG  tablet Take 3 tablets (30 mg total) by mouth daily for 5 days. 04/09/21 04/14/21 Yes Marcello Fennel, PA-C  albuterol [facility] HFA) 108 [phone] Base) MCG/ACT inhaler Inhale 2 puffs into the lungs every 6 (six) hours as needed for cough or wheezing. 11/03/20     amLODipine (NORVASC) 10 MG tablet Take 1 tablet (10 mg total) by mouth daily. 02/23/21     amLODipine (NORVASC) 10 MG tablet Take 1 tablet (10 mg total) by mouth daily. 02/24/21     aspirin EC 81 MG EC tablet Take 1 tablet (81 mg total) by mouth daily. 09/22/17   Shelly Coss, MD  atorvastatin (LIPITOR) 20 MG tablet Take 1 tablet (20 mg total) by mouth daily at 6 PM. 02/23/21     atorvastatin (LIPITOR) 20 MG tablet Take 1 tablet (20 mg total) by mouth daily at 6 PM. 02/24/21     fluticasone-salmeterol (ADVAIR) 250-50 MCG/ACT AEPB Inhale 1 puff into the lungs 2 (two) times daily. Rinse mouth and spit after use. 11/03/20   Rankins, Bill Salinas, MD  hydrochlorothiazide (HYDRODIURIL) 25 MG tablet Take 1 tablet (25 mg total) by mouth in the morning. 12/08/20     ibuprofen (ADVIL) 800 MG tablet Take 1 tablet (800 mg total) by mouth every 8 (eight) hours as needed. 08/09/19   Princess Bruins, MD  linaclotide Rolan Lipa) 290 MCG CAPS capsule TAKE 1 CAPSULE BY MOUTH ONCE A DAY WITH FIRST MEAL 03/31/20 03/31/21  Clarene Essex, MD  Rolan Lipa 290 MCG CAPS capsule  06/12/18   [provider]  oxyCODONE-acetaminophen (PERCOCET/ROXICET) 5-325 MG tablet Take 1 tablet by mouth every 6 (six) hours as needed for pain 11/12/20     tiZANidine (ZANAFLEX) 2 MG tablet Take 1 tablet (2 mg total) by mouth every 8 (eight) hours as needed for muscle pain 11/12/20     triamcinolone cream (KENALOG) 0.1 % Apply 1 application to rash externally 2 times daily until rash resolves 12/08/20       Allergies    Hydrocodone-acetaminophen  Review of Systems   Review of Systems  Constitutional:  Negative for chills and fever.  HENT:  Negative for congestion.   Respiratory:   Negative for shortness of breath.   Cardiovascular:  Negative for chest pain.  Gastrointestinal:  Negative for abdominal pain.  Genitourinary:  Negative for enuresis.  Musculoskeletal:  Negative for back pain.  Skin:  Positive for rash.  Neurological:  Negative for dizziness.  Hematological:  Does not bruise/bleed easily.   Physical Exam Updated Vital Signs  BP 126/79   Pulse 92   Temp 98.4 F (36.9 C) (Oral)   Resp 18   Ht 5\' 4"  (1.626 m)   Wt 84.6 kg   LMP 03/19/2021   SpO2 100%   BMI 32.01 kg/m   Physical Exam Vitals and nursing note reviewed.  Constitutional:      General: She is not in acute distress.    Appearance: She is not ill-appearing.  HENT:     Head: Normocephalic and atraumatic.     Comments: No facial swelling.     Nose: No congestion.     Mouth/Throat:     Mouth: Mucous membranes are moist.     Pharynx: Oropharynx is clear. No oropharyngeal exudate or posterior oropharyngeal erythema.     Comments: No trismus or torticollis, oropharynx is visualized tongue and uvula are both midline, controlling oral secretions, tonsils were equal and symmetrical bilaterally, no increased erythema present, no elevation in tongue. Eyes:     Conjunctiva/sclera: Conjunctivae normal.  Cardiovascular:     Rate and Rhythm: Regular rhythm. Tachycardia present.     Pulses: Normal pulses.     Heart sounds: No murmur heard.   No friction rub. No gallop.  Pulmonary:     Effort: No respiratory distress.     Breath sounds: No wheezing, rhonchi or rales.  Skin:    General: Skin is warm and dry.     Comments: Patient is a noted papule like rash noted on her arms legs back and abdomen which was on erythematous base, there is no drainage or discharge present, no scaling of the skin, rash spares the hands and feet, pressure slightly warm to the touch, nonpainful.   Neurological:     Mental Status: She is alert.  Psychiatric:        Mood and Affect: Mood normal.    ED Results /  Procedures / Treatments   Labs (all labs ordered are listed, but only abnormal results are displayed) Labs Reviewed - No data to display  EKG None  Radiology No results found.  Procedures Procedures   Medications Ordered in ED Medications  diphenhydrAMINE (BENADRYL) injection 50 mg (50 mg Intravenous Given 04/09/21 1902)  famotidine (PEPCID) IVPB 20 mg premix (0 mg Intravenous Stopped 04/09/21 1945)  methylPREDNISolone sodium succinate (SOLU-MEDROL) 125 mg/2 mL injection 125 mg (125 mg Intravenous Given 04/09/21 1906)  sodium chloride 0.9 % bolus 500 mL (0 mLs Intravenous Stopped 04/09/21 2003)    ED Course  I have reviewed the triage vital signs and the nursing notes.  Pertinent labs & imaging results that were available during my care of the patient were reviewed by me and considered in my medical decision making (see chart for details).    MDM Rules/Calculators/A&P                          Initial impression-presents with hives.  She is alert, does not appear to be in acute distress, vital signs noted for tachycardia.  Likely contact dermatitis, will provide her with H1 and H2 blockers as well as Solu-Medrol and reassess.  Work-up-due to well-appearing patient, benign physical exam, further lab work and imaging were not warranted at this time.  Reassessment-patient was reassessed 20 minutes after initiation of H1 and H2 blockers as well as steroids, states she is feeling much better, her itchiness has improved, still has a slight rash on her lower extremities we will continue to monitor.  Patient was reassessed  vital signs have improved, she has no complaints this time, patient still has a small rash on the medial aspect of her legs bilaterally isolated just beside the popliteal.  Patient states she is ready for discharge.  Rule out-I have low suspicion for systemic infection as patient is nontoxic-appearing, vital signs are reassuring.  Noted that the patient did present  with tachycardia but she appears slightly uncomfortable as she was very itchy.  I provided her with fluids H1 H2 blockers and steroids her tach is since resolved.  I have low suspicion for anaphylactic shock as vital signs were reassuring, no GI symptoms, no oral involvement.  I have low suspicion for TENS or Stevens-Johnson's as presentation is atypical of etiology.  Plan-  Rash-likely contact dermatitis, will start her on steroids, have her continue with H1 and H2 blockers, follow-up with allergist for further evaluation.  Gave her strict return precautions.  Vital signs have remained stable, no indication for hospital admission.  Patient given at home care as well strict return precautions.  Patient verbalized that they understood agreed to said plan.  Final Clinical Impression(s) / ED Diagnoses Final diagnoses:  Hives    Rx / DC Orders ED Discharge Orders          Ordered    predniSONE (DELTASONE) 10 MG tablet  Daily        04/09/21 2019    loratadine (CLARITIN) 10 MG tablet  Daily        04/09/21 2019    famotidine (PEPCID) 20 MG tablet  Daily        04/09/21 2019             Marcello Fennel, PA-C 04/09/21 2021    Horton, Alvin Critchley, DO 04/09/21 2251

## 2021-04-09 NOTE — ED Triage Notes (Signed)
Hives on and off since hot bath last night. She took her last Benadryl over 4 hours ago.

## 2021-04-09 NOTE — Discharge Instructions (Signed)
Likely you had a allergic reaction, I have started you on steroids please take as prescribed.  I also given you Pepcid and Claritin please take as prescribed, you may also supplement with Benadryl as this will help with breakthrough itchiness.  I recommend cool baths as this can all help calm your skin.  Please follow-up with your PCP and/or allergist for allergy study.  Come back to the emergency department if you develop chest pain, shortness of breath, severe abdominal pain, uncontrolled nausea, vomiting, diarrhea.

## 2021-04-10 ENCOUNTER — Other Ambulatory Visit (HOSPITAL_COMMUNITY): Payer: Self-pay

## 2021-04-13 ENCOUNTER — Encounter: Payer: Self-pay | Admitting: Physician Assistant

## 2021-04-13 ENCOUNTER — Ambulatory Visit: Payer: 59 | Admitting: Physician Assistant

## 2021-04-13 ENCOUNTER — Other Ambulatory Visit: Payer: Self-pay

## 2021-04-13 VITALS — BP 142/82 | HR 79 | Ht 64.0 in | Wt 190.6 lb

## 2021-04-13 DIAGNOSIS — E782 Mixed hyperlipidemia: Secondary | ICD-10-CM | POA: Diagnosis not present

## 2021-04-13 DIAGNOSIS — I10 Essential (primary) hypertension: Secondary | ICD-10-CM | POA: Diagnosis not present

## 2021-04-13 DIAGNOSIS — Z8249 Family history of ischemic heart disease and other diseases of the circulatory system: Secondary | ICD-10-CM | POA: Diagnosis not present

## 2021-04-13 DIAGNOSIS — M75121 Complete rotator cuff tear or rupture of right shoulder, not specified as traumatic: Secondary | ICD-10-CM | POA: Diagnosis not present

## 2021-04-13 NOTE — Patient Instructions (Signed)
Medication Instructions:  Your physician recommends that you continue on your current medications as directed. Please refer to the Current Medication list given to you today.  *If you need a refill on your cardiac medications before your next appointment, please call your pharmacy*   Lab Work: None ordered  If you have labs (blood work) drawn today and your tests are completely normal, you will receive your results only by: [place] (if you have MyChart) OR A paper copy in the mail If you have any lab test that is abnormal or we need to change your treatment, we will call you to review the results.   Testing/Procedures: None ordered   Follow-Up: At [facility], you and your health needs are our priority.  As part of our continuing mission to provide you with exceptional heart care, we have created designated Provider Care Teams.  These Care Teams include your primary Cardiologist (physician) and Advanced Practice Providers (APPs -  Physician Assistants and Nurse Practitioners) who all work together to provide you with the care you need, when you need it.  We recommend signing up for the patient portal called "MyChart".  Sign up information is provided on this After Visit Summary.  MyChart is used to connect with patients for Virtual Visits (Telemedicine).  Patients are able to view lab/test results, encounter notes, upcoming appointments, etc.  Non-urgent messages can be sent to your provider as well.   To learn more about what you can do with MyChart, go to [email or web address].    Your next appointment:   12 month(s)  The format for your next appointment:   In Person  Provider:   Jenkins Rouge, MD     Other Instructions

## 2021-04-13 NOTE — Progress Notes (Signed)
Cardiology Office Note:    Date:  04/13/2021   ID:  Stacey Mendoza, DOB [birth year], MRN [id number]  PCP:  Aretta Nip, MD  [facility] Cardiologist:  Jenkins Rouge, MD  [place] Electrophysiologist:  None   Chief Complaint: yearly follow up   History of Present Illness:    Stacey Mendoza is a 55 y.o. female with a hx of HTN, TIA, HLD seen for follow up.   Family hx of premature CAD >> sister with MI at age 85. Mother with hx of CAD. Another sister with hx of TIA.   Echo 09/2017 showed normal LVEF.  Cardiac CTA 12/2017 showed calcium score of 0 and normal coronaries.   Last seen by Dr. Johnsie Cancel 03/2020. Walks on treadmill 4 miles/day. The patient denies nausea, vomiting, fever, chest pain, palpitations, shortness of breath, orthopnea, PND, dizziness, syncope, cough, congestion, abdominal pain, hematochezia, melena, lower extremity edema.   Past Medical History:  Diagnosis Date   Abdominal pain, chronic, right upper quadrant 11/28/2013   Abdominal pain, right lower quadrant 06/29/2013   Anemia    Bulging disc    cervical MRI pending   Chest pain 09/21/2017   Dyspepsia and other specified disorders of function of stomach 04/10/2013   Elevated LFTs    Family history of breast cancer in female 11/28/2013   Hypertension    Hypokalemia 09/21/2017   Midepigastric pain 10/23/2012   Migraine headache 06/13/2013   Nonspecific abnormal results of liver function study 06/29/2013   Normal vaginal delivery 1992, 1986, 1990   TIA (transient ischemic attack) 09/21/2017   Unspecified constipation 10/23/2012    Past Surgical History:  Procedure Laterality Date   CHOLECYSTECTOMY N/A 07/06/2017   Procedure: LAPAROSCOPIC CHOLECYSTECTOMY;  Surgeon: Mickeal Skinner, MD;  Location: [facility];  Service: General;  Laterality: N/A;   ROTATOR CUFF REPAIR Right 11/04/2020   TUBAL LIGATION      Current Medications: Current Meds  Medication Sig   albuterol (PROAIR HFA) 108 (90 Base)  MCG/ACT inhaler Inhale 2 puffs into the lungs every 6 (six) hours as needed for cough or wheezing.   amLODipine (NORVASC) 10 MG tablet Take 1 tablet (10 mg total) by mouth daily.   amLODipine (NORVASC) 10 MG tablet Take 1 tablet (10 mg total) by mouth daily.   aspirin EC 81 MG EC tablet Take 1 tablet (81 mg total) by mouth daily.   atorvastatin (LIPITOR) 20 MG tablet Take 1 tablet (20 mg total) by mouth daily at 6 PM.   atorvastatin (LIPITOR) 20 MG tablet Take 1 tablet (20 mg total) by mouth daily at 6 PM.   famotidine (PEPCID) 20 MG tablet Take 1 tablet (20 mg total) by mouth daily for 7 days.   fluticasone-salmeterol (ADVAIR) 250-50 MCG/ACT AEPB Inhale 1 puff into the lungs 2 (two) times daily. Rinse mouth and spit after use.   hydrochlorothiazide (HYDRODIURIL) 25 MG tablet Take 1 tablet (25 mg total) by mouth in the morning.   ibuprofen (ADVIL) 800 MG tablet Take 1 tablet (800 mg total) by mouth every 8 (eight) hours as needed.   LINZESS 290 MCG CAPS capsule    loratadine (CLARITIN) 10 MG tablet Take 1 tablet (10 mg total) by mouth daily for 7 days.   oxyCODONE-acetaminophen (PERCOCET/ROXICET) 5-325 MG tablet Take 1 tablet by mouth every 6 (six) hours as needed for pain   predniSONE (DELTASONE) 10 MG tablet Take 3 tablets (30 mg total) by mouth daily for 5 days.   tiZANidine (  ZANAFLEX) 2 MG tablet Take 1 tablet (2 mg total) by mouth every 8 (eight) hours as needed for muscle pain     Allergies:   Hydrocodone-acetaminophen   Social History   Socioeconomic History   Marital status: Single    Spouse name: Not on file   Number of children: 3   Years of education: Not on file   Highest education level: Not on file  Occupational History   Occupation: Customer service manager: [employer]  Tobacco Use   Smoking status: Never   Smokeless tobacco: Never  Vaping Use   Vaping Use: Never used  Substance and Sexual Activity   Alcohol use: No   Drug use: No   Sexual activity: Yes     Partners: Male    Birth control/protection: Condom    Comment: Decline insurance questions  Other Topics Concern   Not on file  Social History Narrative   Lives alone   Caffeine use: No coffee, no soda   Herbal tea sometimes- no caffeine   Right handed    Social Determinants of Radio broadcast assistant Strain: Not on file  Food Insecurity: Not on file  Transportation Needs: Not on file  Physical Activity: Not on file  Stress: Not on file  Social Connections: Not on file     Family History: The patient's family history includes Breast cancer (age of onset: 60) in her sister; Cancer in her sister; Cancer (age of onset: 68) in an other family member; Diabetes in her mother; Heart disease in her father and mother; Hypertension in her brother, mother, sister, sister, and sister; Throat cancer in her mother. There is no history of Colon cancer.    ROS:   Please see the history of present illness.    All other systems reviewed and are negative.   EKGs/Labs/Other Studies Reviewed:    The following studies were reviewed today:  Echo 09/2017 Study Conclusions   - Left ventricle: The cavity size was normal. Wall thickness was    normal. Systolic function was normal. The estimated ejection    fraction was in the range of 60% to 65%. Wall motion was normal;    there were no regional wall motion abnormalities. Left    ventricular diastolic function parameters were normal.  - Aortic valve: Structurally normal valve. Trileaflet. Cusp    separation was normal. Transvalvular velocity was minimally    increased. There was no stenosis. There was no regurgitation.  - Mitral valve: Mildly thickened leaflets . There was trivial    regurgitation.  - Left atrium: The atrium was normal in size.  - Tricuspid valve: There was trivial regurgitation.  - Pulmonary arteries: PA peak pressure: 23 mm Hg (S).  - Inferior vena cava: The vessel was normal in size. The    respirophasic diameter changes  were in the normal range (= 50%),    consistent with normal central venous pressure.   Impressions:   - LVEF 60-65%, normal wall thickness, normal wall motion, normal    diastolic function, trivial MR, normal LA size, trivial TR, RVSP    23 mmHg, normal IVC.   Coronary CTA 12/2017 IMPRESSION: 1.  Normal right dominant coronary arteries   2.  Calcium score 0   3.  Normal aortic root 2.7 cm  EKG:  EKG is ordered today.  The ekg ordered today demonstrates NSR, non specific ST/T wave chance   Recent Labs: 06/09/2020: Hemoglobin 11.2; Platelets 185.0  Recent Lipid  Panel    Component Value Date/Time   CHOL 194 09/21/2017 0517   TRIG 64 09/21/2017 0517   HDL 59 09/21/2017 0517   CHOLHDL 3.3 09/21/2017 0517   VLDL 13 09/21/2017 0517   LDLCALC 122 (H) 09/21/2017 0517     Physical Exam:    VS:  BP (!) 142/82   Pulse 79   Ht 5\' 4"  (1.626 m)   Wt 190 lb 9.6 oz (86.5 kg)   LMP 03/19/2021   SpO2 99%   BMI 32.72 kg/m     Wt Readings from Last 3 Encounters:  04/13/21 190 lb 9.6 oz (86.5 kg)  04/09/21 186 lb 8.2 oz (84.6 kg)  03/16/21 186 lb 6.4 oz (84.6 kg)     GEN:  Well nourished, well developed in no acute distress HEENT: Normal NECK: No JVD; No carotid bruits LYMPHATICS: No lymphadenopathy CARDIAC: RRR, no murmurs, rubs, gallops RESPIRATORY:  Clear to auscultation without rales, wheezing or rhonchi  ABDOMEN: Soft, non-tender, non-distended MUSCULOSKELETAL:  No edema; No deformity  SKIN: Warm and dry NEUROLOGIC:  Alert and oriented x 3 PSYCHIATRIC:  Normal affect   ASSESSMENT AND PLAN:    HTN - BP 142/82. Normal at home. She was rushing to get in. No change.   2. HLD - Continue statin.  -No results found for requested labs within last 8760 hours.   3. Family hx of early CAD - Reassuring coronary CT in 2019. No anginal pain. Continue exercise regimen. On ASA and statin .   Medication Adjustments/Labs and Tests Ordered: Current medicines are reviewed at  length with the patient today.  Concerns regarding medicines are outlined above.  Orders Placed This Encounter  Procedures   EKG 12-Lead    No orders of the defined types were placed in this encounter.   Patient Instructions  Medication Instructions:  Your physician recommends that you continue on your current medications as directed. Please refer to the Current Medication list given to you today.  *If you need a refill on your cardiac medications before your next appointment, please call your pharmacy*   Lab Work: None ordered  If you have labs (blood work) drawn today and your tests are completely normal, you will receive your results only by: [place] (if you have MyChart) OR A paper copy in the mail If you have any lab test that is abnormal or we need to change your treatment, we will call you to review the results.   Testing/Procedures: None ordered   Follow-Up: At [facility], you and your health needs are our priority.  As part of our continuing mission to provide you with exceptional heart care, we have created designated Provider Care Teams.  These Care Teams include your primary Cardiologist (physician) and Advanced Practice Providers (APPs -  Physician Assistants and Nurse Practitioners) who all work together to provide you with the care you need, when you need it.  We recommend signing up for the patient portal called "MyChart".  Sign up information is provided on this After Visit Summary.  MyChart is used to connect with patients for Virtual Visits (Telemedicine).  Patients are able to view lab/test results, encounter notes, upcoming appointments, etc.  Non-urgent messages can be sent to your provider as well.   To learn more about what you can do with MyChart, go to [email or web address].    Your next appointment:   12 month(s)  The format for your next appointment:   In Person  Provider:   Jenkins Rouge,  MD     Other Instructions     Signed, Leanor Kail, PA  04/13/2021 11:39 AM    [facility]

## 2021-04-15 DIAGNOSIS — M75121 Complete rotator cuff tear or rupture of right shoulder, not specified as traumatic: Secondary | ICD-10-CM | POA: Diagnosis not present

## 2021-04-22 DIAGNOSIS — M75121 Complete rotator cuff tear or rupture of right shoulder, not specified as traumatic: Secondary | ICD-10-CM | POA: Diagnosis not present

## 2021-04-27 DIAGNOSIS — M25511 Pain in right shoulder: Secondary | ICD-10-CM | POA: Diagnosis not present

## 2021-04-27 DIAGNOSIS — M75121 Complete rotator cuff tear or rupture of right shoulder, not specified as traumatic: Secondary | ICD-10-CM | POA: Diagnosis not present

## 2021-04-29 DIAGNOSIS — M75121 Complete rotator cuff tear or rupture of right shoulder, not specified as traumatic: Secondary | ICD-10-CM | POA: Diagnosis not present

## 2021-04-30 ENCOUNTER — Other Ambulatory Visit: Payer: Self-pay | Admitting: Orthopedic Surgery

## 2021-04-30 DIAGNOSIS — M25511 Pain in right shoulder: Secondary | ICD-10-CM

## 2021-05-06 DIAGNOSIS — M75121 Complete rotator cuff tear or rupture of right shoulder, not specified as traumatic: Secondary | ICD-10-CM | POA: Diagnosis not present

## 2021-05-07 DIAGNOSIS — M75121 Complete rotator cuff tear or rupture of right shoulder, not specified as traumatic: Secondary | ICD-10-CM | POA: Diagnosis not present

## 2021-05-08 ENCOUNTER — Emergency Department (HOSPITAL_BASED_OUTPATIENT_CLINIC_OR_DEPARTMENT_OTHER): Payer: 59

## 2021-05-08 ENCOUNTER — Other Ambulatory Visit: Payer: Self-pay

## 2021-05-08 ENCOUNTER — Emergency Department (HOSPITAL_BASED_OUTPATIENT_CLINIC_OR_DEPARTMENT_OTHER)
Admission: EM | Admit: 2021-05-08 | Discharge: 2021-05-08 | Disposition: A | Discharge status: Home / Self Care | Payer: 59 | Attending: Emergency Medicine | Admitting: Emergency Medicine

## 2021-05-08 ENCOUNTER — Other Ambulatory Visit (HOSPITAL_COMMUNITY): Payer: Self-pay

## 2021-05-08 ENCOUNTER — Encounter (HOSPITAL_BASED_OUTPATIENT_CLINIC_OR_DEPARTMENT_OTHER): Payer: Self-pay

## 2021-05-08 DIAGNOSIS — R112 Nausea with vomiting, unspecified: Secondary | ICD-10-CM | POA: Insufficient documentation

## 2021-05-08 DIAGNOSIS — M549 Dorsalgia, unspecified: Secondary | ICD-10-CM | POA: Diagnosis present

## 2021-05-08 DIAGNOSIS — Y92410 Unspecified street and highway as the place of occurrence of the external cause: Secondary | ICD-10-CM | POA: Diagnosis not present

## 2021-05-08 DIAGNOSIS — Z79899 Other long term (current) drug therapy: Secondary | ICD-10-CM | POA: Diagnosis not present

## 2021-05-08 DIAGNOSIS — S0990XA Unspecified injury of head, initial encounter: Secondary | ICD-10-CM | POA: Diagnosis not present

## 2021-05-08 DIAGNOSIS — M4802 Spinal stenosis, cervical region: Secondary | ICD-10-CM | POA: Diagnosis not present

## 2021-05-08 DIAGNOSIS — Z7982 Long term (current) use of aspirin: Secondary | ICD-10-CM | POA: Diagnosis not present

## 2021-05-08 DIAGNOSIS — M542 Cervicalgia: Secondary | ICD-10-CM | POA: Diagnosis not present

## 2021-05-08 DIAGNOSIS — M50221 Other cervical disc displacement at C4-C5 level: Secondary | ICD-10-CM | POA: Diagnosis not present

## 2021-05-08 DIAGNOSIS — I10 Essential (primary) hypertension: Secondary | ICD-10-CM | POA: Insufficient documentation

## 2021-05-08 DIAGNOSIS — R519 Headache, unspecified: Secondary | ICD-10-CM | POA: Insufficient documentation

## 2021-05-08 MED ORDER — ONDANSETRON 4 MG PO TBDP
4.0000 mg | ORAL_TABLET | Freq: Once | ORAL | Status: AC
  Administered 2021-05-08: 20:00:00 4 mg via ORAL
  Filled 2021-05-08: qty 1

## 2021-05-08 MED ORDER — IBUPROFEN 800 MG PO TABS
800.0000 mg | ORAL_TABLET | Freq: Once | ORAL | Status: AC
  Administered 2021-05-08: 20:00:00 800 mg via ORAL
  Filled 2021-05-08: qty 1

## 2021-05-08 MED ORDER — ONDANSETRON HCL 4 MG PO TABS: 4.0000 mg | ORAL_TABLET | Freq: Four times a day (QID) | ORAL | 0 refills | Status: DC
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
## 2021-05-12 ENCOUNTER — Other Ambulatory Visit (HOSPITAL_COMMUNITY): Payer: Self-pay

## 2021-05-12 DIAGNOSIS — M75121 Complete rotator cuff tear or rupture of right shoulder, not specified as traumatic: Secondary | ICD-10-CM | POA: Diagnosis not present

## 2021-05-12 MED ORDER — LINZESS 290 MCG PO CAPS
290.0000 ug | ORAL_CAPSULE | Freq: Every day | ORAL | 3 refills | Status: DC
Start: 2021-05-12 — End: 2021-11-04
  Filled 2021-05-12: qty 30, 30d supply, fill #0
  Filled 2021-10-02: qty 30, 30d supply, fill #1

## 2021-05-13 ENCOUNTER — Other Ambulatory Visit (HOSPITAL_COMMUNITY): Payer: Self-pay

## 2021-05-25 DIAGNOSIS — M75121 Complete rotator cuff tear or rupture of right shoulder, not specified as traumatic: Secondary | ICD-10-CM | POA: Diagnosis not present

## 2021-05-27 ENCOUNTER — Other Ambulatory Visit: Payer: Self-pay

## 2021-05-27 ENCOUNTER — Ambulatory Visit
Admission: RE | Admit: 2021-05-27 | Discharge: 2021-05-27 | Disposition: A | Discharge status: Home / Self Care | Payer: 59 | Source: Ambulatory Visit | Attending: Orthopedic Surgery | Admitting: Orthopedic Surgery

## 2021-05-27 DIAGNOSIS — M19011 Primary osteoarthritis, right shoulder: Secondary | ICD-10-CM | POA: Diagnosis not present

## 2021-05-27 DIAGNOSIS — M25511 Pain in right shoulder: Secondary | ICD-10-CM

## 2021-06-01 ENCOUNTER — Other Ambulatory Visit (HOSPITAL_COMMUNITY): Payer: Self-pay

## 2021-06-01 DIAGNOSIS — M25511 Pain in right shoulder: Secondary | ICD-10-CM | POA: Diagnosis not present

## 2021-06-03 DIAGNOSIS — M75121 Complete rotator cuff tear or rupture of right shoulder, not specified as traumatic: Secondary | ICD-10-CM | POA: Diagnosis not present

## 2021-06-08 ENCOUNTER — Other Ambulatory Visit: Payer: Self-pay | Admitting: Family Medicine

## 2021-06-08 DIAGNOSIS — Z1231 Encounter for screening mammogram for malignant neoplasm of breast: Secondary | ICD-10-CM

## 2021-06-17 DIAGNOSIS — M75121 Complete rotator cuff tear or rupture of right shoulder, not specified as traumatic: Secondary | ICD-10-CM | POA: Diagnosis not present

## 2021-06-22 DIAGNOSIS — M75121 Complete rotator cuff tear or rupture of right shoulder, not specified as traumatic: Secondary | ICD-10-CM | POA: Diagnosis not present

## 2021-07-01 DIAGNOSIS — M75121 Complete rotator cuff tear or rupture of right shoulder, not specified as traumatic: Secondary | ICD-10-CM | POA: Diagnosis not present

## 2021-07-13 DIAGNOSIS — M25511 Pain in right shoulder: Secondary | ICD-10-CM | POA: Diagnosis not present

## 2021-07-20 DIAGNOSIS — M75121 Complete rotator cuff tear or rupture of right shoulder, not specified as traumatic: Secondary | ICD-10-CM | POA: Diagnosis not present

## 2021-07-24 ENCOUNTER — Ambulatory Visit
Admission: RE | Admit: 2021-07-24 | Discharge: 2021-07-24 | Disposition: A | Discharge status: Home / Self Care | Payer: 59 | Source: Ambulatory Visit | Attending: Family Medicine | Admitting: Family Medicine

## 2021-07-24 DIAGNOSIS — Z1231 Encounter for screening mammogram for malignant neoplasm of breast: Secondary | ICD-10-CM

## 2021-08-10 ENCOUNTER — Other Ambulatory Visit (HOSPITAL_COMMUNITY): Payer: Self-pay

## 2021-08-10 MED ORDER — HYDROCHLOROTHIAZIDE 25 MG PO TABS
25.0000 mg | ORAL_TABLET | Freq: Every morning | ORAL | 1 refills | Status: DC
  Filled 2021-08-10: qty 90, 90d supply, fill #0

## 2021-08-12 ENCOUNTER — Other Ambulatory Visit (HOSPITAL_COMMUNITY): Payer: Self-pay

## 2021-08-12 DIAGNOSIS — E7800 Pure hypercholesterolemia, unspecified: Secondary | ICD-10-CM | POA: Diagnosis not present

## 2021-08-12 DIAGNOSIS — I10 Essential (primary) hypertension: Secondary | ICD-10-CM | POA: Diagnosis not present

## 2021-08-12 MED ORDER — HYDROCHLOROTHIAZIDE 25 MG PO TABS
25.0000 mg | ORAL_TABLET | Freq: Every day | ORAL | 1 refills | Status: DC
  Filled 2021-08-12 – 2021-11-12 (×2): qty 90, 90d supply, fill #0
  Filled 2022-02-09: qty 90, 90d supply, fill #1

## 2021-08-12 MED ORDER — AMLODIPINE BESYLATE 10 MG PO TABS
10.0000 mg | ORAL_TABLET | Freq: Every day | ORAL | 1 refills | Status: DC
  Filled 2021-08-12: qty 90, 90d supply, fill #0
  Filled 2021-11-12: qty 90, 90d supply, fill #1

## 2021-08-12 MED ORDER — ATORVASTATIN CALCIUM 20 MG PO TABS
20.0000 mg | ORAL_TABLET | Freq: Every day | ORAL | 1 refills | Status: DC
  Filled 2021-08-12 – 2021-11-12 (×2): qty 90, 90d supply, fill #0
  Filled 2022-03-23: qty 90, 90d supply, fill #1

## 2021-08-17 ENCOUNTER — Other Ambulatory Visit (HOSPITAL_COMMUNITY): Payer: Self-pay

## 2021-10-02 ENCOUNTER — Other Ambulatory Visit (HOSPITAL_COMMUNITY): Payer: Self-pay

## 2021-10-14 DIAGNOSIS — Z03818 Encounter for observation for suspected exposure to other biological agents ruled out: Secondary | ICD-10-CM | POA: Diagnosis not present

## 2021-10-14 DIAGNOSIS — R52 Pain, unspecified: Secondary | ICD-10-CM | POA: Diagnosis not present

## 2021-10-14 DIAGNOSIS — R059 Cough, unspecified: Secondary | ICD-10-CM | POA: Diagnosis not present

## 2021-10-14 DIAGNOSIS — R6883 Chills (without fever): Secondary | ICD-10-CM | POA: Diagnosis not present

## 2021-10-14 DIAGNOSIS — R0981 Nasal congestion: Secondary | ICD-10-CM | POA: Diagnosis not present

## 2021-11-04 ENCOUNTER — Ambulatory Visit (INDEPENDENT_AMBULATORY_CARE_PROVIDER_SITE_OTHER): Payer: 59

## 2021-11-04 ENCOUNTER — Ambulatory Visit: Payer: 59 | Admitting: Pulmonary Disease

## 2021-11-04 ENCOUNTER — Encounter: Payer: Self-pay | Admitting: Pulmonary Disease

## 2021-11-04 ENCOUNTER — Other Ambulatory Visit: Payer: Self-pay

## 2021-11-04 ENCOUNTER — Other Ambulatory Visit (HOSPITAL_COMMUNITY): Payer: Self-pay

## 2021-11-04 VITALS — BP 122/84 | HR 91 | Temp 97.1°F | Ht 65.0 in | Wt 179.0 lb

## 2021-11-04 DIAGNOSIS — R059 Cough, unspecified: Secondary | ICD-10-CM | POA: Diagnosis not present

## 2021-11-04 MED ORDER — AZITHROMYCIN 250 MG PO TABS
ORAL_TABLET | ORAL | 0 refills | Status: DC
  Filled 2021-11-04: qty 6, 5d supply, fill #0

## 2021-11-04 MED ORDER — OMEPRAZOLE 40 MG PO CPDR
40.0000 mg | DELAYED_RELEASE_CAPSULE | Freq: Two times a day (BID) | ORAL | 5 refills | Status: DC
  Filled 2021-11-04: qty 60, 30d supply, fill #0
  Filled 2022-02-09: qty 60, 30d supply, fill #1
  Filled 2022-08-22: qty 60, 30d supply, fill #2
  Filled 2022-10-12: qty 60, 30d supply, fill #3

## 2021-11-04 MED ORDER — PREDNISONE 20 MG PO TABS
40.0000 mg | ORAL_TABLET | Freq: Every day | ORAL | 0 refills | Status: DC
  Filled 2021-11-04: qty 10, 5d supply, fill #0

## 2021-11-04 NOTE — Progress Notes (Incomplete)
Stacey Mendoza    [id number]    [birth year]  Primary Care Physician:Rankins, Bill Salinas, MD  Referring Physician: Aretta Nip, [place],  [place] [id number]  Chief complaint: Follow-up for cough, post COVID-83  HPI: 56 year old with history of hypertension, stroke Has history of cough when she was placed on lisinopril many years ago.  This improved after she was taken off this medication Developed COVID-19 in June 2019.  She did not require hospitalization.  However she had a recurrence of cough after the infection Cough is nonproductive in nature, no dyspnea, wheezing.  She has been started on albuterol and Advair by her primary care for possible reactive airway disease.  Symptoms are worse at night and on laying down Overall the cough is improving but she continues to have residual symptoms  No overt symptoms of allergy or acid reflux.  Pets: No pets Occupation: Housekeeping in the pediatric ward of [employer] Exposures: No mold, hot tub, Jacuzzi.  No feather pillows or comforter Smoking history: Never smoker Travel history: No significant travel history Relevant family history: No significant family history of lung disease  Interim history: Has been off advair inhaler for the past 6 months.  Overall she is doing well but has noticed some dyspnea on exertion recently which she attributes to weight gain  On albuterol which she uses several times a week.  Underwent right shoulder arthroplasty in June 2022 without any respiratory issue.   Outpatient Encounter Medications as of 11/04/2021  Medication Sig   albuterol (PROAIR HFA) 108 (90 Base) MCG/ACT inhaler Inhale 2 puffs into the lungs every 6 (six) hours as needed for cough or wheezing.   amLODipine (NORVASC) 10 MG tablet Take 1 tablet (10 mg total) by mouth daily.   aspirin EC 81 MG EC tablet Take 1 tablet (81 mg total) by mouth daily.   atorvastatin (LIPITOR) 20 MG tablet Take 1  tablet (20 mg total) by mouth daily at 6pm   cetirizine (ZYRTEC) 10 MG tablet Take 10 mg by mouth daily.   hydrochlorothiazide (HYDRODIURIL) 25 MG tablet Take 1 tablet (25 mg total) by mouth daily in the morning   LINZESS 290 MCG CAPS capsule    famotidine (PEPCID) 20 MG tablet Take 1 tablet (20 mg total) by mouth daily for 7 days. (Patient not taking: Reported on 11/04/2021)   fluticasone-salmeterol (ADVAIR) 250-50 MCG/ACT AEPB Inhale 1 puff into the lungs 2 (two) times daily. Rinse mouth and spit after use. (Patient not taking: Reported on 11/04/2021)   triamcinolone cream (KENALOG) 0.1 % Apply 1 application to rash externally 2 times daily until rash resolves (Patient not taking: Reported on 04/13/2021)   [DISCONTINUED] amLODipine (NORVASC) 10 MG tablet Take 1 tablet (10 mg total) by mouth daily.   [DISCONTINUED] amLODipine (NORVASC) 10 MG tablet Take 1 tablet (10 mg total) by mouth daily.   [DISCONTINUED] atorvastatin (LIPITOR) 20 MG tablet Take 1 tablet (20 mg total) by mouth daily at 6 PM.   [DISCONTINUED] atorvastatin (LIPITOR) 20 MG tablet Take 1 tablet (20 mg total) by mouth daily at 6 PM.   [DISCONTINUED] hydrochlorothiazide (HYDRODIURIL) 25 MG tablet Take 1 tablet (25 mg total) by mouth in the morning.   [DISCONTINUED] hydrochlorothiazide (HYDRODIURIL) 25 MG tablet Take 1 tablet (25 mg total) by mouth in the morning.   [DISCONTINUED] ibuprofen (ADVIL) 800 MG tablet Take 1 tablet (800 mg total) by mouth every 8 (eight) hours as needed. (  Patient not taking: Reported on 11/04/2021)   [DISCONTINUED] linaclotide (LINZESS) 290 MCG CAPS capsule TAKE 1 CAPSULE BY MOUTH ONCE A DAY WITH FIRST MEAL   [DISCONTINUED] linaclotide (LINZESS) 290 MCG CAPS capsule Take 1 capsule (290 mcg total) by mouth daily with first meal   [DISCONTINUED] loratadine (CLARITIN) 10 MG tablet Take 1 tablet (10 mg total) by mouth daily for 7 days.   [DISCONTINUED] ondansetron (ZOFRAN) 4 MG tablet Take 1 tablet (4 mg total) by  mouth every 6 (six) hours.   [DISCONTINUED] oxyCODONE-acetaminophen (PERCOCET/ROXICET) 5-325 MG tablet Take 1 tablet by mouth every 6 (six) hours as needed for pain   [DISCONTINUED] tiZANidine (ZANAFLEX) 2 MG tablet Take 1 tablet (2 mg total) by mouth every 8 (eight) hours as needed for muscle pain   No facility-administered encounter medications on file as of 11/04/2021.    Physical Exam: Blood pressure 126/68, pulse 83, height '5\' 4"'$  (1.626 m), weight 186 lb 6.4 oz (84.6 kg), SpO2 99 %. Gen:      No acute distress HEENT:  EOMI, sclera anicteric Neck:     No masses; no thyromegaly Lungs:    Clear to auscultation bilaterally; normal respiratory effort CV:         Regular rate and rhythm; no murmurs Abd:      + bowel sounds; soft, non-tender; no palpable masses, no distension Ext:    No edema; adequate peripheral perfusion Skin:      Warm and dry; no rash Neuro: alert and oriented x 3 Psych: normal mood and affect   Data Reviewed: Imaging: Chest x-ray 09/20/2017-no active cardiopulmonary disease.   Chest x-ray 06/09/2020-no acute cardiopulmonary abnormality I have reviewed the images personally.  PFTs: 07/22/2020 FVC 2.74 [95%], FEV1 2.42 [26%], F/F 88, TLC 3.89 [77%], DLCO 21.91 [94%] Minimal restriction  Labs: CBC 09/23/2017-WBC 6.3, eos 1%, absolute eosinophil count 63 CBC 06/09/2020-WBC 5.4, eos 1.1%, absolute eosinophil count 59  IgE 06/09/2020-38  Assessment:  Cough Suspect upper airway cough, GERD.   She does have history of COVID-19 but no evidence of post Covid fibrosis.  Chest x-ray looks okay, PFTs with minimal restriction likely related to body habitus No evidence of asthma on PFTs or labs  She will continue PPI for reflux Continue to observe off advair inhaler Will need to work on weight loss and exercise Follow-up in 6 months.  If she is still stable at that point then consider discharge from pulmonary clinic  Plan/Recommendations: Weight loss with diet and  exercise Follow-up in 6 months  Marshell Garfinkel MD [facility] 11/04/2021, 4:22 PM  CC: Rankins, Bill Salinas, MD

## 2021-11-04 NOTE — Patient Instructions (Signed)
We will do chest x-ray today Restart advair inhaler, prednisone 40 mg a day for 5 days Take Prilosec 40 mg twice daily Prescribe Z-Pak Return to clinic in 1 to 2 months.

## 2021-11-05 ENCOUNTER — Other Ambulatory Visit (HOSPITAL_COMMUNITY): Payer: Self-pay

## 2021-11-05 MED ORDER — ALBUTEROL SULFATE HFA 108 (90 BASE) MCG/ACT IN AERS
2.0000 | INHALATION_SPRAY | Freq: Four times a day (QID) | RESPIRATORY_TRACT | 2 refills | Status: DC | PRN
  Filled 2021-11-05: qty 8.5, 25d supply, fill #0

## 2021-11-12 ENCOUNTER — Other Ambulatory Visit (HOSPITAL_COMMUNITY): Payer: Self-pay

## 2021-11-30 ENCOUNTER — Ambulatory Visit: Payer: 59 | Admitting: Obstetrics & Gynecology

## 2021-12-17 ENCOUNTER — Encounter: Payer: Self-pay | Admitting: Pulmonary Disease

## 2021-12-17 ENCOUNTER — Ambulatory Visit: Payer: 59 | Admitting: Pulmonary Disease

## 2021-12-17 VITALS — BP 126/80 | HR 82 | Temp 98.2°F | Ht 65.0 in | Wt 185.2 lb

## 2021-12-17 DIAGNOSIS — R059 Cough, unspecified: Secondary | ICD-10-CM

## 2021-12-17 NOTE — Patient Instructions (Addendum)
Continue the inhalers as prescribed Continue the nasal spray You can use an antihistamine over-the-counter called chlorpheniramine or Chlor-Trimeton.  Use 4 mg 3 times a day for 2 weeks to help with postnasal drip.  Watch for side effects of sleepiness Return to clinic in 6 months

## 2021-12-17 NOTE — Progress Notes (Signed)
Stacey Mendoza    [id number]    06-15-1965  Primary Care Physician:Rankins, Bill Salinas, MD  Referring Physician: Aretta Nip, [place],  [place] [address]  Chief complaint: Follow-up for cough, post COVID-74  HPI: 56 year old with history of hypertension, stroke Has history of cough when she was placed on lisinopril many years ago.  This improved after she was taken off this medication Developed COVID-19 in June 2019.  She did not require hospitalization.  However she had a recurrence of cough after the infection Cough is nonproductive in nature, no dyspnea, wheezing.  She has been started on albuterol and Advair by her primary care for possible reactive airway disease.  Symptoms are worse at night and on laying down Overall the cough is improving but she continues to have residual symptoms  No overt symptoms of allergy or acid reflux.  Underwent right shoulder arthroplasty in June 2022 without any respiratory issue.  Pets: No pets Occupation: Housekeeping in the pediatric ward of [employer] Exposures: No mold, hot tub, Jacuzzi.  No feather pillows or comforter Smoking history: Never smoker Travel history: No significant travel history Relevant family history: No significant family history of lung disease  Interim history: Cough had been previously well controlled but she was seen 1 month ago for flareup with increasing productive cough and wheezing  Advair inhaler was resumed and she was treated with prednisone for 5 days and Z-Pak.  PPI increased to twice daily.  Overall she is improved and cough is much better  Outpatient Encounter Medications as of 12/17/2021  Medication Sig   albuterol (PROAIR HFA) 108 (90 Base) MCG/ACT inhaler Inhale 2 puffs into the lungs every 6 (six) hours as needed for cough or wheezing.   albuterol (PROAIR HFA) 108 (90 Base) MCG/ACT inhaler Inhale 2 puffs into the lungs every 6 (six) hours as needed for cough/  wheezing   amLODipine (NORVASC) 10 MG tablet Take 1 tablet (10 mg total) by mouth daily.   aspirin EC 81 MG EC tablet Take 1 tablet (81 mg total) by mouth daily.   atorvastatin (LIPITOR) 20 MG tablet Take 1 tablet (20 mg total) by mouth daily at 6pm   cetirizine (ZYRTEC) 10 MG tablet Take 10 mg by mouth daily.   fluticasone-salmeterol (ADVAIR) 250-50 MCG/ACT AEPB Inhale 1 puff into the lungs 2 (two) times daily. Rinse mouth and spit after use.   hydrochlorothiazide (HYDRODIURIL) 25 MG tablet Take 1 tablet (25 mg total) by mouth daily in the morning   LINZESS 290 MCG CAPS capsule    omeprazole (PRILOSEC) 40 MG capsule Take 1 capsule (40 mg total) by mouth in the morning and at bedtime.   triamcinolone cream (KENALOG) 0.1 % Apply 1 application to rash externally 2 times daily until rash resolves   [DISCONTINUED] azithromycin (ZITHROMAX) 250 MG tablet Take 2 today, then 1 daily until gone   [DISCONTINUED] predniSONE (DELTASONE) 20 MG tablet Take 2 tablets (40 mg total) by mouth daily for 5 days   famotidine (PEPCID) 20 MG tablet Take 1 tablet (20 mg total) by mouth daily for 7 days. (Patient not taking: Reported on 11/04/2021)   No facility-administered encounter medications on file as of 12/17/2021.    Physical Exam: Blood pressure 126/80, pulse 82, temperature 98.2 F (36.8 C), temperature source Oral, height '5\' 5"'$  (1.651 m), weight 185 lb 3.2 oz (84 kg), SpO2 98 %. Gen:      No acute  distress HEENT:  EOMI, sclera anicteric Neck:     No masses; no thyromegaly Lungs:    Clear to auscultation bilaterally; normal respiratory effort CV:         Regular rate and rhythm; no murmurs Abd:      + bowel sounds; soft, non-tender; no palpable masses, no distension Ext:    No edema; adequate peripheral perfusion Skin:      Warm and dry; no rash Neuro: alert and oriented x 3 Psych: normal mood and affect   Data Reviewed: Imaging: Chest x-ray 09/20/2017-no active cardiopulmonary disease.   Chest x-ray  06/09/2020-no acute cardiopulmonary abnormality Chest x-ray 11/04/2021-no acute cardiopulmonary abnormality I have reviewed the images personally.  PFTs: 07/22/2020 FVC 2.74 [95%], FEV1 2.42 [26%], F/F 88, TLC 3.89 [77%], DLCO 21.91 [94%] Minimal restriction   Labs: CBC 09/23/2017-WBC 6.3, eos 1%, absolute eosinophil count 63 CBC 06/09/2020-WBC 5.4, eos 1.1%, absolute eosinophil count 59  IgE 06/09/2020-38  Assessment:  Cough Suspect upper airway cough, GERD.   She does have history of COVID-19 but no evidence of post Covid fibrosis.  PFTs with minimal restriction likely related to body habitus Suspicion for asthma is low but now she has recurrence of cough with wheezing secondary to bronchitis.  Symptoms improved with resumption of her advair inhaler.  She may not need to be on long-term controller medication and we can reassess at return visit  Continue steroid nasal spray.  Start chlorpheniramine over-the-counter for postnasal drip Continue PPI for GERD  Plan/Recommendations: Advair Steroid nasal spray, antihistamine PPI  Marshell Garfinkel MD [facility] 12/17/2021, 8:50 AM  CC: Rankins, Bill Salinas, MD

## 2021-12-30 ENCOUNTER — Ambulatory Visit (INDEPENDENT_AMBULATORY_CARE_PROVIDER_SITE_OTHER): Payer: 59 | Admitting: Obstetrics & Gynecology

## 2021-12-30 ENCOUNTER — Encounter: Payer: Self-pay | Admitting: Obstetrics & Gynecology

## 2021-12-30 VITALS — BP 124/80 | Ht 65.0 in | Wt 185.0 lb

## 2021-12-30 DIAGNOSIS — E6609 Other obesity due to excess calories: Secondary | ICD-10-CM

## 2021-12-30 DIAGNOSIS — Z789 Other specified health status: Secondary | ICD-10-CM

## 2021-12-30 DIAGNOSIS — N951 Menopausal and female climacteric states: Secondary | ICD-10-CM

## 2021-12-30 DIAGNOSIS — Z6830 Body mass index (BMI) 30.0-30.9, adult: Secondary | ICD-10-CM | POA: Diagnosis not present

## 2021-12-30 DIAGNOSIS — Z01419 Encounter for gynecological examination (general) (routine) without abnormal findings: Secondary | ICD-10-CM

## 2021-12-30 NOTE — Progress Notes (Signed)
Stacey Mendoza November 05, 1965 [id number]   History:    56 y.o. G3P3L3 Single/Boyfriend stable x 20 years.   RP: Established patient presenting for Annual/Gynecologic Exam   HPI:  Menses every 1-4 months with normal flow.  No BTB.  Hot flushes and night sweats.  Using condoms when sexually active.  No pelvic pain.  H/O Uterine Fibroids. Pap Neg 07/2019. No h/o abnormal Pap.  Will repeat Pap at 3 years.  Urine/BMs normal.  Breasts normal.  Mammo Neg 07/2021.  BMI 30.79.  Good fitness.  Colonoscopy done. Health labs with Fam MD.   Past medical history,surgical history, family history and social history were all reviewed and documented in the EPIC chart.  Gynecologic History Patient's last menstrual period was 09/23/2021.  Obstetric History OB History  Gravida Para Term Preterm AB Living  '3 3   3   3  '$ SAB IAB Ectopic Multiple Live Births               # Outcome Date GA Lbr Len/2nd Weight Sex Delivery Anes PTL Lv  3 Preterm           2 Preterm           1 Preterm              ROS: A ROS was performed and pertinent positives and negatives are included in the history. GENERAL: No fevers or chills. HEENT: No change in vision, no earache, sore throat or sinus congestion. NECK: No pain or stiffness. CARDIOVASCULAR: No chest pain or pressure. No palpitations. PULMONARY: No shortness of breath, cough or wheeze. GASTROINTESTINAL: No abdominal pain, nausea, vomiting or diarrhea, melena or bright red blood per rectum. GENITOURINARY: No urinary frequency, urgency, hesitancy or dysuria. MUSCULOSKELETAL: No joint or muscle pain, no back pain, no recent trauma. DERMATOLOGIC: No rash, no itching, no lesions. ENDOCRINE: No polyuria, polydipsia, no heat or cold intolerance. No recent change in weight. HEMATOLOGICAL: No anemia or easy bruising or bleeding. NEUROLOGIC: No headache, seizures, numbness, tingling or weakness. PSYCHIATRIC: No depression, no loss of interest in normal activity or change in sleep  pattern.     Exam:   BP 124/80 (BP Location: Right Arm, Patient Position: Sitting, Cuff Size: Normal)   Ht '5\' 5"'$  (1.651 m)   Wt 185 lb (83.9 kg)   LMP 09/23/2021   BMI 30.79 kg/m   Body mass index is 30.79 kg/m.  General appearance : Well developed well nourished female. No acute distress HEENT: Eyes: no retinal hemorrhage or exudates,  Neck supple, trachea midline, no carotid bruits, no thyroidmegaly Lungs: Clear to auscultation, no rhonchi or wheezes, or rib retractions  Heart: Regular rate and rhythm, no murmurs or gallops Breast:Examined in sitting and supine position were symmetrical in appearance, no palpable masses or tenderness,  no skin retraction, no nipple inversion, no nipple discharge, no skin discoloration, no axillary or supraclavicular lymphadenopathy Abdomen: no palpable masses or tenderness, no rebound or guarding Extremities: no edema or skin discoloration or tenderness  Pelvic: Vulva: Normal             Vagina: No gross lesions or discharge  Cervix: No gross lesions or discharge  Uterus  AV, normal size, shape and consistency, non-tender and mobile  Adnexa  Without masses or tenderness  Anus: Normal   Assessment/Plan:  56 y.o. female for annual exam   1. Well female exam with routine gynecological exam Menses every 1-4 months with normal flow.  No BTB.  Hot  flushes and night sweats.  Using condoms when sexually active.  No pelvic pain.  H/O Uterine Fibroids. Pap Neg 07/2019. No h/o abnormal Pap.  Will repeat Pap at 3 years.  Urine/BMs normal.  Breasts normal.  Mammo Neg 07/2021.  BMI 30.79.  Good fitness.  Colonoscopy done. Health labs with Fam MD.  2. Perimenopausal symptoms Menses every 1-4 months with normal flow.  No BTB.  Hot flushes and night sweats.  Will check [facility] today.  HRT discussed.  Patient prefer going through perimenopause/menopause without HRT.  Personal h/o TIAs.  Sister with Breast Ca. - [place]  3. Uses condoms for contraception  4. Class 1  obesity due to excess calories with serious comorbidity and body mass index (BMI) of 30.0 to 30.9 in adult  Low calorie/carb diet.  Continue with fitness.  Princess Bruins MD, 2:43 PM 12/30/2021

## 2021-12-31 LAB — FOLLICLE STIMULATING HORMONE: FSH: 24.7 m[IU]/mL

## 2022-02-05 DIAGNOSIS — H52221 Regular astigmatism, right eye: Secondary | ICD-10-CM | POA: Diagnosis not present

## 2022-02-05 DIAGNOSIS — H524 Presbyopia: Secondary | ICD-10-CM | POA: Diagnosis not present

## 2022-02-09 ENCOUNTER — Other Ambulatory Visit (HOSPITAL_COMMUNITY): Payer: Self-pay

## 2022-02-09 MED ORDER — AMLODIPINE BESYLATE 10 MG PO TABS
10.0000 mg | ORAL_TABLET | Freq: Every day | ORAL | 0 refills | Status: DC
  Filled 2022-02-09: qty 90, 90d supply, fill #0

## 2022-02-09 MED ORDER — ALBUTEROL SULFATE HFA 108 (90 BASE) MCG/ACT IN AERS
2.0000 | INHALATION_SPRAY | Freq: Four times a day (QID) | RESPIRATORY_TRACT | 2 refills | Status: DC | PRN
  Filled 2022-02-09: qty 6.7, 25d supply, fill #0
  Filled 2022-08-22: qty 6.7, 25d supply, fill #1

## 2022-02-10 ENCOUNTER — Other Ambulatory Visit (HOSPITAL_COMMUNITY): Payer: Self-pay

## 2022-02-19 ENCOUNTER — Other Ambulatory Visit (HOSPITAL_COMMUNITY): Payer: Self-pay

## 2022-02-19 DIAGNOSIS — R635 Abnormal weight gain: Secondary | ICD-10-CM | POA: Diagnosis not present

## 2022-02-19 DIAGNOSIS — F411 Generalized anxiety disorder: Secondary | ICD-10-CM | POA: Diagnosis not present

## 2022-02-19 DIAGNOSIS — F3341 Major depressive disorder, recurrent, in partial remission: Secondary | ICD-10-CM | POA: Diagnosis not present

## 2022-02-19 DIAGNOSIS — D509 Iron deficiency anemia, unspecified: Secondary | ICD-10-CM | POA: Diagnosis not present

## 2022-02-19 DIAGNOSIS — Z1331 Encounter for screening for depression: Secondary | ICD-10-CM | POA: Diagnosis not present

## 2022-02-19 DIAGNOSIS — I10 Essential (primary) hypertension: Secondary | ICD-10-CM | POA: Diagnosis not present

## 2022-02-19 DIAGNOSIS — E559 Vitamin D deficiency, unspecified: Secondary | ICD-10-CM | POA: Diagnosis not present

## 2022-02-19 DIAGNOSIS — R062 Wheezing: Secondary | ICD-10-CM | POA: Diagnosis not present

## 2022-02-19 DIAGNOSIS — Z8249 Family history of ischemic heart disease and other diseases of the circulatory system: Secondary | ICD-10-CM | POA: Diagnosis not present

## 2022-02-19 DIAGNOSIS — E7800 Pure hypercholesterolemia, unspecified: Secondary | ICD-10-CM | POA: Diagnosis not present

## 2022-02-19 MED ORDER — BUPROPION HCL ER (XL) 150 MG PO TB24
150.0000 mg | ORAL_TABLET | Freq: Every morning | ORAL | 1 refills | Status: DC
  Filled 2022-02-19: qty 30, 30d supply, fill #0

## 2022-02-19 MED ORDER — LORAZEPAM 0.5 MG PO TABS
0.5000 mg | ORAL_TABLET | Freq: Every evening | ORAL | 1 refills | Status: DC | PRN
Start: 2022-02-19 — End: 2022-03-24
  Filled 2022-02-19: qty 30, 30d supply, fill #0

## 2022-03-01 ENCOUNTER — Other Ambulatory Visit (HOSPITAL_COMMUNITY): Payer: Self-pay

## 2022-03-01 DIAGNOSIS — K2100 Gastro-esophageal reflux disease with esophagitis, without bleeding: Secondary | ICD-10-CM | POA: Diagnosis not present

## 2022-03-01 DIAGNOSIS — I10 Essential (primary) hypertension: Secondary | ICD-10-CM | POA: Diagnosis not present

## 2022-03-01 DIAGNOSIS — K5901 Slow transit constipation: Secondary | ICD-10-CM | POA: Diagnosis not present

## 2022-03-01 MED ORDER — OMEPRAZOLE 20 MG PO CPDR
20.0000 mg | DELAYED_RELEASE_CAPSULE | Freq: Every day | ORAL | 3 refills | Status: DC
  Filled 2022-03-01: qty 90, 90d supply, fill #0

## 2022-03-01 MED ORDER — LINZESS 290 MCG PO CAPS
290.0000 ug | ORAL_CAPSULE | Freq: Every day | ORAL | 3 refills | Status: DC
Start: 2022-03-01 — End: 2023-09-07
  Filled 2022-03-01: qty 90, 90d supply, fill #0
  Filled 2022-06-18: qty 90, 90d supply, fill #1
  Filled 2022-10-12: qty 90, 90d supply, fill #2

## 2022-03-10 DIAGNOSIS — I10 Essential (primary) hypertension: Secondary | ICD-10-CM | POA: Diagnosis not present

## 2022-03-10 DIAGNOSIS — E559 Vitamin D deficiency, unspecified: Secondary | ICD-10-CM | POA: Diagnosis not present

## 2022-03-10 DIAGNOSIS — E7800 Pure hypercholesterolemia, unspecified: Secondary | ICD-10-CM | POA: Diagnosis not present

## 2022-03-10 DIAGNOSIS — K5901 Slow transit constipation: Secondary | ICD-10-CM | POA: Diagnosis not present

## 2022-03-10 DIAGNOSIS — D509 Iron deficiency anemia, unspecified: Secondary | ICD-10-CM | POA: Diagnosis not present

## 2022-03-10 DIAGNOSIS — Z6831 Body mass index (BMI) 31.0-31.9, adult: Secondary | ICD-10-CM | POA: Diagnosis not present

## 2022-03-23 ENCOUNTER — Other Ambulatory Visit (HOSPITAL_COMMUNITY): Payer: Self-pay

## 2022-03-24 ENCOUNTER — Other Ambulatory Visit (HOSPITAL_COMMUNITY): Payer: Self-pay

## 2022-03-24 DIAGNOSIS — D509 Iron deficiency anemia, unspecified: Secondary | ICD-10-CM | POA: Diagnosis not present

## 2022-03-24 DIAGNOSIS — Z6830 Body mass index (BMI) 30.0-30.9, adult: Secondary | ICD-10-CM | POA: Diagnosis not present

## 2022-03-24 DIAGNOSIS — I10 Essential (primary) hypertension: Secondary | ICD-10-CM | POA: Diagnosis not present

## 2022-03-24 DIAGNOSIS — F331 Major depressive disorder, recurrent, moderate: Secondary | ICD-10-CM | POA: Diagnosis not present

## 2022-03-24 DIAGNOSIS — F411 Generalized anxiety disorder: Secondary | ICD-10-CM | POA: Diagnosis not present

## 2022-03-24 DIAGNOSIS — Z8249 Family history of ischemic heart disease and other diseases of the circulatory system: Secondary | ICD-10-CM | POA: Diagnosis not present

## 2022-03-24 DIAGNOSIS — Z8673 Personal history of transient ischemic attack (TIA), and cerebral infarction without residual deficits: Secondary | ICD-10-CM | POA: Diagnosis not present

## 2022-03-24 DIAGNOSIS — R7301 Impaired fasting glucose: Secondary | ICD-10-CM | POA: Diagnosis not present

## 2022-03-24 MED ORDER — BUPROPION HCL ER (XL) 300 MG PO TB24
300.0000 mg | ORAL_TABLET | Freq: Every morning | ORAL | 1 refills | Status: DC
  Filled 2022-03-24: qty 90, 90d supply, fill #0
  Filled 2022-06-18: qty 90, 90d supply, fill #1

## 2022-05-20 ENCOUNTER — Other Ambulatory Visit (HOSPITAL_COMMUNITY): Payer: Self-pay

## 2022-05-20 ENCOUNTER — Other Ambulatory Visit: Payer: Self-pay

## 2022-05-20 DIAGNOSIS — L299 Pruritus, unspecified: Secondary | ICD-10-CM | POA: Diagnosis not present

## 2022-05-20 DIAGNOSIS — L0390 Cellulitis, unspecified: Secondary | ICD-10-CM | POA: Diagnosis not present

## 2022-05-20 MED ORDER — HYDROCHLOROTHIAZIDE 25 MG PO TABS
25.0000 mg | ORAL_TABLET | Freq: Every morning | ORAL | 1 refills | Status: DC
  Filled 2022-05-20: qty 90, 90d supply, fill #0
  Filled 2022-08-22: qty 90, 90d supply, fill #1

## 2022-05-20 MED ORDER — AMLODIPINE BESYLATE 10 MG PO TABS
10.0000 mg | ORAL_TABLET | Freq: Every day | ORAL | 1 refills | Status: DC
  Filled 2022-05-20: qty 90, 90d supply, fill #0
  Filled 2022-08-22: qty 90, 90d supply, fill #1

## 2022-05-20 MED ORDER — ATORVASTATIN CALCIUM 20 MG PO TABS
20.0000 mg | ORAL_TABLET | Freq: Every day | ORAL | 2 refills | Status: DC
  Filled 2022-05-20 – 2022-10-12 (×2): qty 90, 90d supply, fill #0
  Filled 2023-02-09: qty 90, 90d supply, fill #1
  Filled 2023-05-06: qty 90, 90d supply, fill #2

## 2022-05-20 MED ORDER — DOXYCYCLINE HYCLATE 100 MG PO CAPS
100.0000 mg | ORAL_CAPSULE | Freq: Two times a day (BID) | ORAL | 0 refills | Status: DC
  Filled 2022-05-20: qty 14, 7d supply, fill #0

## 2022-05-20 MED ORDER — TRIAMCINOLONE ACETONIDE 0.1 % EX CREA
TOPICAL_CREAM | Freq: Two times a day (BID) | CUTANEOUS | 0 refills | Status: DC
  Filled 2022-05-20: qty 45, 7d supply, fill #0

## 2022-06-11 ENCOUNTER — Other Ambulatory Visit: Payer: Self-pay | Admitting: Family Medicine

## 2022-06-11 DIAGNOSIS — Z1231 Encounter for screening mammogram for malignant neoplasm of breast: Secondary | ICD-10-CM
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
## 2022-08-02 ENCOUNTER — Ambulatory Visit
Admission: RE | Admit: 2022-08-02 | Discharge: 2022-08-02 | Disposition: A | Discharge status: Home / Self Care | Payer: Commercial Managed Care - PPO | Source: Ambulatory Visit | Attending: Family Medicine | Admitting: Family Medicine

## 2022-08-02 DIAGNOSIS — Z1231 Encounter for screening mammogram for malignant neoplasm of breast: Secondary | ICD-10-CM

## 2022-08-02 NOTE — Progress Notes (Signed)
Cardiology Office Note:    Date:  08/09/2022   ID:  Stacey Mendoza, DOB 12/16/1965, MRN [id number]  PCP:  Stacey Mendoza, No  [facility] Cardiologist:  Stacey Rouge, MD  [place] Electrophysiologist:  None   Chief Complaint: yearly follow up   History of Present Illness:    Stacey Mendoza is a 57 y.o. female with a hx of HTN, TIA, HLD seen for follow up. I have not seen her since 03/24/20   Family hx of premature CAD >> sister with MI at age 48. Mother with hx of CAD. Another sister with hx of TIA.   Echo 09/2017 showed normal LVEF.  Cardiac CTA 12/2017 showed calcium score of 0 and normal coronaries.   Last seen by me 03/2020. Walks on treadmill 4 miles/day. No chest pain, palpitations, syncope or dyspnea   Chronic cough. Had with ACE in past Started on albuterol and Advair Seen by pulmonary 12/17/21 History of COVID but no fibrosis ? Related to GERD PFT's with minimal restriction related to body habitus on PPI  Likes shoes and pocket books and shopping in [place]   Past Medical History:  Diagnosis Date   Abdominal pain, chronic, right upper quadrant 11/28/2013   Abdominal pain, right lower quadrant 06/29/2013   Anemia    Bulging disc    cervical MRI pending   Chest pain 09/21/2017   Dyspepsia and other specified disorders of function of stomach 04/10/2013   Elevated LFTs    Family history of breast cancer in female 11/28/2013   Hypertension    Hypokalemia 09/21/2017   Midepigastric pain 10/23/2012   Migraine headache 06/13/2013   Nonspecific abnormal results of liver function study 06/29/2013   Normal vaginal delivery 1992, 1986, 1990   TIA (transient ischemic attack) 09/21/2017   Unspecified constipation 10/23/2012    Past Surgical History:  Procedure Laterality Date   CHOLECYSTECTOMY N/A 07/06/2017   Procedure: LAPAROSCOPIC CHOLECYSTECTOMY;  Surgeon: Mickeal Skinner, MD;  Location: [facility];  Service: General;  Laterality: N/A;   ROTATOR CUFF REPAIR Right 11/04/2020    TUBAL LIGATION      Current Medications: Current Meds  Medication Sig   amLODipine (NORVASC) 10 MG tablet Take 1 tablet (10 mg total) by mouth daily   aspirin EC 81 MG EC tablet Take 1 tablet (81 mg total) by mouth daily.   atorvastatin (LIPITOR) 20 MG tablet Take 1 tablet (20 mg total) by mouth every evening at 6 PM.   hydrochlorothiazide (HYDRODIURIL) 25 MG tablet Take 1 tablet (25 mg total) by mouth in the morning.   linaclotide (LINZESS) 290 MCG CAPS capsule Take 1 capsule (290 mcg total) by mouth daily with first meal   [DISCONTINUED] hydrochlorothiazide (HYDRODIURIL) 25 MG tablet Take 1 tablet (25 mg total) by mouth daily in the morning   [DISCONTINUED] LINZESS 290 MCG CAPS capsule      Allergies:   Hydrocodone-acetaminophen   Social History   Socioeconomic History   Marital status: Single    Spouse name: Not on file   Number of children: 3   Years of education: Not on file   Highest education level: Not on file  Occupational History   Occupation: Customer service manager: [employer]  Tobacco Use   Smoking status: Never   Smokeless tobacco: Never  Vaping Use   Vaping Use: Never used  Substance and Sexual Activity   Alcohol use: No   Drug use: No   Sexual activity: Yes  Partners: Male    Birth control/protection: Condom    Comment: Decline insurance questions  Other Topics Concern   Not on file  Social History Narrative   Lives alone   Caffeine use: No coffee, no soda   Herbal tea sometimes- no caffeine   Right handed    Social Determinants of Radio broadcast assistant Strain: Not on file  Food Insecurity: Not on file  Transportation Needs: Not on file  Physical Activity: Not on file  Stress: Not on file  Social Connections: Not on file     Family History: The patient's family history includes Breast cancer (age of onset: 22) in her sister; Cancer in her sister; Cancer (age of onset: 97) in an other family member; Diabetes in her mother; Heart  disease in her father and mother; Hypertension in her brother, mother, sister, sister, and sister; Throat cancer in her mother. There is no history of Colon cancer.    ROS:   Please see the history of present illness.    All other systems reviewed and are negative.   EKGs/Labs/Other Studies Reviewed:    The following studies were reviewed today:  Echo 09/2017 Study Conclusions   - Left ventricle: The cavity size was normal. Wall thickness was    normal. Systolic function was normal. The estimated ejection    fraction was in the range of 60% to 65%. Wall motion was normal;    there were no regional wall motion abnormalities. Left    ventricular diastolic function parameters were normal.  - Aortic valve: Structurally normal valve. Trileaflet. Cusp    separation was normal. Transvalvular velocity was minimally    increased. There was no stenosis. There was no regurgitation.  - Mitral valve: Mildly thickened leaflets . There was trivial    regurgitation.  - Left atrium: The atrium was normal in size.  - Tricuspid valve: There was trivial regurgitation.  - Pulmonary arteries: PA peak pressure: 23 mm Hg (S).  - Inferior vena cava: The vessel was normal in size. The    respirophasic diameter changes were in the normal range (= 50%),    consistent with normal central venous pressure.   Impressions:   - LVEF 60-65%, normal wall thickness, normal wall motion, normal    diastolic function, trivial MR, normal LA size, trivial TR, RVSP    23 mmHg, normal IVC.   Coronary CTA 12/2017 IMPRESSION: 1.  Normal right dominant coronary arteries   2.  Calcium score 0   3.  Normal aortic root 2.7 cm  EKG:  08/09/2022 NSR nonspecific ST changes   Recent Labs: No results found for requested labs within last 365 days.  Recent Lipid Panel    Component Value Date/Time   CHOL 194 09/21/2017 0517   TRIG 64 09/21/2017 0517   HDL 59 09/21/2017 0517   CHOLHDL 3.3 09/21/2017 0517   VLDL 13  09/21/2017 0517   LDLCALC 122 (H) 09/21/2017 0517     Physical Exam:    VS:  BP (!) 140/80   Pulse 76   Ht 5\' 5"  (1.651 m)   Wt 167 lb 3.2 oz (75.8 kg)   SpO2 98%   BMI 27.82 kg/m     Wt Readings from Last 3 Encounters:  08/09/22 167 lb 3.2 oz (75.8 kg)  12/30/21 185 lb (83.9 kg)  12/17/21 185 lb 3.2 oz (84 kg)     GEN:  Well nourished, well developed in no acute distress HEENT: Normal NECK: No  JVD; No carotid bruits LYMPHATICS: No lymphadenopathy CARDIAC: RRR, no murmurs, rubs, gallops RESPIRATORY:  Clear to auscultation without rales, wheezing or rhonchi  ABDOMEN: Soft, non-tender, non-distended MUSCULOSKELETAL:  No edema; No deformity  SKIN: Warm and dry NEUROLOGIC:  Alert and oriented x 3 PSYCHIATRIC:  Normal affect   ASSESSMENT AND PLAN:    HTN - Well controlled.  Continue current medications and low sodium Dash type diet.  Home readings better than office  .   2. HLD - Continue statin.  -labs with primary   3. Family hx of early CAD - Reassuring coronary CT in 2019.  Normal right dominant cors with calcium score 0 No anginal pain. Continue exercise regimen. On ASA and statin . Update calcium score   4. Cough - F/U Manam avoid ACE - continue Advair and albuterol - continue prilosec    Calcium score  F/U in a year   Signed, Stacey Rouge, MD  08/09/2022 8:34 AM    [place]

## 2022-08-09 ENCOUNTER — Encounter: Payer: Self-pay | Admitting: Cardiovascular Disease

## 2022-08-09 ENCOUNTER — Ambulatory Visit: Payer: Commercial Managed Care - PPO | Attending: Cardiovascular Disease | Admitting: Cardiovascular Disease

## 2022-08-09 VITALS — BP 140/80 | HR 76 | Ht 65.0 in | Wt 167.2 lb

## 2022-08-09 DIAGNOSIS — I10 Essential (primary) hypertension: Secondary | ICD-10-CM

## 2022-08-09 DIAGNOSIS — E782 Mixed hyperlipidemia: Secondary | ICD-10-CM | POA: Diagnosis not present

## 2022-08-09 DIAGNOSIS — Z8249 Family history of ischemic heart disease and other diseases of the circulatory system: Secondary | ICD-10-CM | POA: Diagnosis not present

## 2022-08-09 NOTE — Patient Instructions (Signed)
Medication Instructions:  Your physician recommends that you continue on your current medications as directed. Please refer to the Current Medication list given to you today.  *If you need a refill on your cardiac medications before your next appointment, please call your pharmacy*  Lab Work: If you have labs (blood work) drawn today and your tests are completely normal, you will receive your results only by: MyChart Message (if you have MyChart) OR A paper copy in the mail If you have any lab test that is abnormal or we need to change your treatment, we will call you to review the results.  Testing/Procedures: Cardiac CT scanning for calcium score, (CAT scanning), is a noninvasive, special x-ray that produces cross-sectional images of the body using x-rays and a computer. CT scans help physicians diagnose and treat medical conditions. For some CT exams, a contrast material is used to enhance visibility in the area of the body being studied. CT scans provide greater clarity and reveal more details than regular x-ray exams.   Follow-Up: At [facility], you and your health needs are our priority.  As part of our continuing mission to provide you with exceptional heart care, we have created designated Provider Care Teams.  These Care Teams include your primary Cardiologist (physician) and Advanced Practice Providers (APPs -  Physician Assistants and Nurse Practitioners) who all work together to provide you with the care you need, when you need it.  We recommend signing up for the patient portal called "MyChart".  Sign up information is provided on this After Visit Summary.  MyChart is used to connect with patients for Virtual Visits (Telemedicine).  Patients are able to view lab/test results, encounter notes, upcoming appointments, etc.  Non-urgent messages can be sent to your provider as well.   To learn more about what you can do with MyChart, go to [email or web address].    Your next  appointment:   1 year(s)  Provider:   Peter Nishan, MD     

## 2022-08-16 ENCOUNTER — Ambulatory Visit (HOSPITAL_COMMUNITY)
Admission: RE | Admit: 2022-08-16 | Discharge: 2022-08-16 | Disposition: A | Discharge status: Home / Self Care | Payer: Commercial Managed Care - PPO | Source: Ambulatory Visit | Attending: Cardiovascular Disease | Admitting: Cardiovascular Disease

## 2022-08-16 DIAGNOSIS — E782 Mixed hyperlipidemia: Secondary | ICD-10-CM | POA: Insufficient documentation

## 2022-08-16 DIAGNOSIS — Z8249 Family history of ischemic heart disease and other diseases of the circulatory system: Secondary | ICD-10-CM | POA: Insufficient documentation

## 2022-08-16 DIAGNOSIS — I10 Essential (primary) hypertension: Secondary | ICD-10-CM | POA: Insufficient documentation

## 2022-08-23 ENCOUNTER — Other Ambulatory Visit: Payer: Self-pay

## 2022-09-08 ENCOUNTER — Emergency Department (HOSPITAL_BASED_OUTPATIENT_CLINIC_OR_DEPARTMENT_OTHER): Payer: Commercial Managed Care - PPO

## 2022-09-08 ENCOUNTER — Emergency Department (HOSPITAL_BASED_OUTPATIENT_CLINIC_OR_DEPARTMENT_OTHER)
Admission: EM | Admit: 2022-09-08 | Discharge: 2022-09-08 | Disposition: A | Discharge status: Home / Self Care | Payer: Commercial Managed Care - PPO | Attending: Emergency Medicine | Admitting: Emergency Medicine

## 2022-09-08 ENCOUNTER — Encounter (HOSPITAL_BASED_OUTPATIENT_CLINIC_OR_DEPARTMENT_OTHER): Payer: Self-pay | Admitting: Emergency Medicine

## 2022-09-08 ENCOUNTER — Other Ambulatory Visit: Payer: Self-pay

## 2022-09-08 DIAGNOSIS — Z8673 Personal history of transient ischemic attack (TIA), and cerebral infarction without residual deficits: Secondary | ICD-10-CM | POA: Diagnosis not present

## 2022-09-08 DIAGNOSIS — G4489 Other headache syndrome: Secondary | ICD-10-CM | POA: Diagnosis not present

## 2022-09-08 DIAGNOSIS — R42 Dizziness and giddiness: Secondary | ICD-10-CM | POA: Insufficient documentation

## 2022-09-08 DIAGNOSIS — R531 Weakness: Secondary | ICD-10-CM | POA: Diagnosis not present

## 2022-09-08 DIAGNOSIS — B9789 Other viral agents as the cause of diseases classified elsewhere: Secondary | ICD-10-CM | POA: Diagnosis not present

## 2022-09-08 DIAGNOSIS — R34 Anuria and oliguria: Secondary | ICD-10-CM | POA: Diagnosis not present

## 2022-09-08 DIAGNOSIS — J069 Acute upper respiratory infection, unspecified: Secondary | ICD-10-CM | POA: Diagnosis not present

## 2022-09-08 DIAGNOSIS — R051 Acute cough: Secondary | ICD-10-CM | POA: Diagnosis not present

## 2022-09-08 DIAGNOSIS — R059 Cough, unspecified: Secondary | ICD-10-CM | POA: Diagnosis not present

## 2022-09-08 DIAGNOSIS — R0602 Shortness of breath: Secondary | ICD-10-CM | POA: Diagnosis not present

## 2022-09-08 DIAGNOSIS — Z7982 Long term (current) use of aspirin: Secondary | ICD-10-CM | POA: Diagnosis not present

## 2022-09-08 DIAGNOSIS — Z1152 Encounter for screening for COVID-19: Secondary | ICD-10-CM | POA: Insufficient documentation

## 2022-09-08 DIAGNOSIS — R5383 Other fatigue: Secondary | ICD-10-CM | POA: Diagnosis not present

## 2022-09-08 DIAGNOSIS — Z03818 Encounter for observation for suspected exposure to other biological agents ruled out: Secondary | ICD-10-CM | POA: Diagnosis not present

## 2022-09-08 DIAGNOSIS — R519 Headache, unspecified: Secondary | ICD-10-CM | POA: Insufficient documentation

## 2022-09-08 LAB — COMPREHENSIVE METABOLIC PANEL
ALT: 25 U/L (ref 0–44)
AST: 21 U/L (ref 15–41)
Albumin: 4.1 g/dL (ref 3.5–5.0)
Alkaline Phosphatase: 97 U/L (ref 38–126)
Anion gap: 9 (ref 5–15)
BUN: 15 mg/dL (ref 6–20)
CO2: 26 mmol/L (ref 22–32)
Calcium: 9.6 mg/dL (ref 8.9–10.3)
Chloride: 102 mmol/L (ref 98–111)
Creatinine, Ser: 0.73 mg/dL (ref 0.44–1.00)
GFR, Estimated: >60 mL/min (ref >60)
Glucose, Bld: 98 mg/dL (ref 70–99)
Potassium: 3.6 mmol/L (ref 3.5–5.1)
Sodium: 137 mmol/L (ref 135–145)
Total Bilirubin: 0.7 mg/dL (ref 0.3–1.2)
Total Protein: 7.9 g/dL (ref 6.5–8.1)

## 2022-09-08 LAB — CBC WITH DIFFERENTIAL/PLATELET
Abs Immature Granulocytes: 0.01 10*3/uL (ref 0.00–0.07)
Basophils Absolute: 0 10*3/uL (ref 0.0–0.1)
Basophils Relative: 1 %
Eosinophils Absolute: 0 10*3/uL (ref 0.0–0.5)
Eosinophils Relative: 1 %
HCT: 37.9 % (ref 36.0–46.0)
Hemoglobin: 12.4 g/dL (ref 12.0–15.0)
Immature Granulocytes: 0 %
Lymphocytes Relative: 28 %
Lymphs Abs: 1.6 10*3/uL (ref 0.7–4.0)
MCH: 22.9 pg — ABNORMAL LOW (ref 26.0–34.0)
MCHC: 32.7 g/dL (ref 30.0–36.0)
MCV: 69.9 fL — ABNORMAL LOW (ref 80.0–100.0)
Monocytes Absolute: 0.5 10*3/uL (ref 0.1–1.0)
Monocytes Relative: 9 %
Neutro Abs: 3.5 10*3/uL (ref 1.7–7.7)
Neutrophils Relative %: 61 %
Platelets: 168 10*3/uL (ref 150–400)
RBC: 5.42 MIL/uL — ABNORMAL HIGH (ref 3.87–5.11)
RDW: 15.7 % — ABNORMAL HIGH (ref 11.5–15.5)
WBC: 5.6 10*3/uL (ref 4.0–10.5)
nRBC: 0 % (ref 0.0–0.2)

## 2022-09-08 LAB — RESP PANEL BY RT-PCR (RSV, FLU A&B, COVID)  RVPGX2
Influenza A by PCR: NEGATIVE
Influenza B by PCR: NEGATIVE
Resp Syncytial Virus by PCR: NEGATIVE
SARS Coronavirus 2 by RT PCR: NEGATIVE

## 2022-09-08 MED ORDER — METOCLOPRAMIDE HCL 5 MG/ML IJ SOLN
10.0000 mg | Freq: Once | INTRAMUSCULAR | Status: AC
  Administered 2022-09-08: 10 mg via INTRAVENOUS
  Filled 2022-09-08: qty 2

## 2022-09-08 MED ORDER — BENZONATATE 100 MG PO CAPS
100.0000 mg | ORAL_CAPSULE | Freq: Three times a day (TID) | ORAL | 0 refills | Status: DC
  Filled 2022-09-08: qty 21, 7d supply, fill #0

## 2022-09-08 MED ORDER — BENZONATATE 100 MG PO CAPS
100.0000 mg | ORAL_CAPSULE | Freq: Once | ORAL | Status: AC
  Administered 2022-09-08: 100 mg via ORAL
  Filled 2022-09-08: qty 1

## 2022-09-08 MED ORDER — SODIUM CHLORIDE 0.9 % IV BOLUS
1000.0000 mL | Freq: Once | INTRAVENOUS | Status: AC
  Administered 2022-09-08: 1000 mL via INTRAVENOUS

## 2022-09-08 MED ORDER — ALBUTEROL SULFATE (2.5 MG/3ML) 0.083% IN NEBU
2.5000 mg | INHALATION_SOLUTION | Freq: Once | RESPIRATORY_TRACT | Status: AC
  Administered 2022-09-08: 2.5 mg via RESPIRATORY_TRACT
  Filled 2022-09-08: qty 3

## 2022-09-08 MED ORDER — DIPHENHYDRAMINE HCL 50 MG/ML IJ SOLN
25.0000 mg | Freq: Once | INTRAMUSCULAR | Status: AC
  Administered 2022-09-08: 25 mg via INTRAVENOUS
  Filled 2022-09-08: qty 1

## 2022-09-08 MED ORDER — KETOROLAC TROMETHAMINE 15 MG/ML IJ SOLN
15.0000 mg | Freq: Once | INTRAMUSCULAR | Status: AC
  Administered 2022-09-08: 15 mg via INTRAVENOUS
  Filled 2022-09-08: qty 1

## 2022-09-08 NOTE — ED Provider Notes (Signed)
[place] [facility] AT [facility] Provider Note   CSN: [id number] Arrival date & time: 09/08/22  1603     History  Chief Complaint  Patient presents with   Dizziness    Stacey Mendoza is a 57 y.o. female,, history of TIA, hyperlipidemia, who presents to the ED secondary to chills, cough, fatigue, muscle aches for the last 4 days, now with new onset of weakness, and slight dizziness, when standing up.  She states that she has been feeling unwell for the last few days, and that is progressively getting worse.  She is having a dry cough, and just feels a little bit short of breath.  Notes she went to her primary care doctor today and had a virtual visit and was told to come to the ER.  She states she has had a reduced appetite, and does not really want to eat.  States she just feels unwell.  Denies any chest pain, abdominal pain, nausea, vomiting.  Is still making urine appropriately,  Home Medications Prior to Admission medications   Medication Sig Start Date End Date Taking? Authorizing Provider  benzonatate (TESSALON) 100 MG capsule Take 1 capsule (100 mg total) by mouth every 8 (eight) hours. 09/08/22  Yes Dinero Chavira L, PA  albuterol (PROAIR HFA) 108 (90 Base) MCG/ACT inhaler Inhale 2 puffs into the lungs every 6 (six) hours as needed for cough/wheezing. Patient not taking: Reported on 08/09/2022 02/09/22     amLODipine (NORVASC) 10 MG tablet Take 1 tablet (10 mg total) by mouth daily 05/20/22     aspirin EC 81 MG EC tablet Take 1 tablet (81 mg total) by mouth daily. 09/22/17   Burnadette Pop, MD  atorvastatin (LIPITOR) 20 MG tablet Take 1 tablet (20 mg total) by mouth every evening at 6 PM. 05/20/22     buPROPion (WELLBUTRIN XL) 300 MG 24 hr tablet Take 1 tablet (300 mg total) by mouth in the morning. Patient not taking: Reported on 08/09/2022 03/24/22     cetirizine (ZYRTEC) 10 MG tablet Take 10 mg by mouth daily. Patient not taking: Reported on 08/09/2022     [provider]  doxycycline (VIBRAMYCIN) 100 MG capsule Take 1 capsule (100 mg total) by mouth 2 (two) times daily for 7 days Patient not taking: Reported on 08/09/2022 05/20/22     famotidine (PEPCID) 20 MG tablet Take 1 tablet (20 mg total) by mouth daily for 7 days. Patient not taking: Reported on 11/04/2021 04/09/21 04/17/21  Carroll Sage, PA-C  fluticasone-salmeterol (ADVAIR) 250-50 MCG/ACT AEPB Inhale 1 puff into the lungs 2 (two) times daily. Rinse mouth and spit after use. Patient not taking: Reported on 08/09/2022 11/03/20   Rankins, Fanny Dance, MD  hydrochlorothiazide (HYDRODIURIL) 25 MG tablet Take 1 tablet (25 mg total) by mouth in the morning. 05/20/22     linaclotide (LINZESS) 290 MCG CAPS capsule Take 1 capsule (290 mcg total) by mouth daily with first meal 03/01/22     omeprazole (PRILOSEC) 20 MG capsule Take 1 capsule (20 mg total) by mouth daily before morning meal Patient not taking: Reported on 08/09/2022 03/01/22     omeprazole (PRILOSEC) 40 MG capsule Take 1 capsule (40 mg total) by mouth in the morning and at bedtime. Patient not taking: Reported on 08/09/2022 11/04/21   Chilton Greathouse, MD  triamcinolone cream (KENALOG) 0.1 % Apply 1 application topically 2 (two) times daily to affected area as needed for itching for 7 days Patient not taking: Reported  on 08/09/2022 05/20/22         Allergies    Hydrocodone-acetaminophen    Review of Systems   Review of Systems  Constitutional:  Positive for chills and fatigue.  HENT:  Negative for ear pain.   Neurological:  Positive for dizziness and headaches.    Physical Exam Updated Vital Signs BP (!) 146/95 (BP Location: Right Arm)   Pulse 84   Temp 97.8 F (36.6 C)   Resp 20   Ht  (1.626 m)   Wt 74.4 kg   SpO2 100%   BMI 28.15 kg/m  Physical Exam Vitals and nursing note reviewed.  Constitutional:      General: She is not in acute distress.    Appearance: She is well-developed.  HENT:     Head:  Normocephalic and atraumatic.  Eyes:     Conjunctiva/sclera: Conjunctivae normal.  Cardiovascular:     Rate and Rhythm: Normal rate and regular rhythm.     Heart sounds: No murmur heard. Pulmonary:     Effort: Pulmonary effort is normal. No respiratory distress.     Breath sounds: Normal breath sounds.  Abdominal:     Palpations: Abdomen is soft.     Tenderness: There is no abdominal tenderness.  Musculoskeletal:        General: No swelling.     Cervical back: Neck supple.  Skin:    General: Skin is warm and dry.     Capillary Refill: Capillary refill takes less than 2 seconds.  Neurological:     Mental Status: She is alert.  Psychiatric:        Mood and Affect: Mood normal.     ED Results / Procedures / Treatments   Labs (all labs ordered are listed, but only abnormal results are displayed) Labs Reviewed  CBC WITH DIFFERENTIAL/PLATELET - Abnormal; Notable for the following components:      Result Value   RBC 5.42 (*)    MCV 69.9 (*)    MCH 22.9 (*)    RDW 15.7 (*)    All other components within normal limits  RESP PANEL BY RT-PCR (RSV, FLU A&B, COVID)  [id number]  COMPREHENSIVE METABOLIC PANEL    EKG None  Radiology DG Chest 2 View  Result Date: 09/08/2022 CLINICAL DATA:  Cough. EXAM: CHEST - 2 VIEW COMPARISON:  11/04/2021. FINDINGS: Clear lungs. Normal heart size and mediastinal contours. No pleural effusion or pneumothorax. Visualized bones and upper abdomen are unremarkable. IMPRESSION: No evidence of acute cardiopulmonary disease. Electronically Signed   By: Orvan Falconer M.D.   On: 09/08/2022 16:44    Procedures Procedures    Medications Ordered in ED Medications  ketorolac (TORADOL) 15 MG/ML injection 15 mg (15 mg Intravenous Given 09/08/22 1800)  metoCLOPramide (REGLAN) injection 10 mg (10 mg Intravenous Given 09/08/22 1758)  diphenhydrAMINE (BENADRYL) injection 25 mg (25 mg Intravenous Given 09/08/22 1801)  sodium chloride 0.9 % bolus 1,000 mL (0 mLs  Intravenous Stopped 09/08/22 1922)  benzonatate (TESSALON) capsule 100 mg (100 mg Oral Given 09/08/22 1802)  albuterol (PROVENTIL) (2.5 MG/3ML) 0.083% nebulizer solution 2.5 mg (2.5 mg Nebulization Given 09/08/22 1814)    ED Course/ Medical Decision Making/ A&P                             Medical Decision Making Patient is a 57 year old female, here for chills, cough fatigue, muscle aches for the 4 days, as well as weakness and  slight dizziness when standing.  She states she just been feeling unwell.  Will obtain a chest x-ray, labs, COVID/flu, for further evaluation, she has no acute neurodeficits so we will hold off on a head CT, and we will also treat her headache, I am suspicious that this is likely viral in nature given her systemic symptoms.  Amount and/or Complexity of Data Reviewed Labs: ordered.    Details: No acute findings, COVID flu negative Radiology: ordered.    Details: Chest x-ray clear, she has no acute findings Discussion of management or test interpretation with external provider(s): Discussed with patient, her chest x-ray is reassuring, she is COVID/flu negative, and her labs are reassuring.  She does not have an AKI.  She is feeling better after fluids, and headache headache cocktail, she has no headache at this time, I believe that her dizziness/headache may have been from reduced p.o. intake, and I believe that her body aches cough, weakness is likely from a viral complaint.  We discussed conservative care and I prescribed her Jerilynn Som for home.  She has an albuterol inhaler to help with any kind of shortness of breath.  She is well-appearing and agreeable with follow-up with primary care doctor.  Risk Prescription drug management.    Final Clinical Impression(s) / ED Diagnoses Final diagnoses:  Viral upper respiratory tract infection  Dizziness  Acute nonintractable headache, unspecified headache type    Rx / DC Orders ED Discharge Orders          Ordered     benzonatate (TESSALON) 100 MG capsule  Every 8 hours        09/08/22 1854              Pete Pelt, [place] 09/08/22 2347    Tegeler, Canary Brim, MD 09/08/22 [phone]

## 2022-09-08 NOTE — Discharge Instructions (Addendum)
Labs today were reassuring, you do not have any evidence of pneumonia, or any kind of electrolyte deficiency.  Make sure you are drinking lots of fluids, and resting.  I have sent you some medication to help stop your cough.  And you should use your albuterol inhaler, to help your shortness of breath.

## 2022-09-08 NOTE — ED Triage Notes (Signed)
Pt w/ multiple complaints since Mon: dizziness, general weakness, cough, body aches; was seen at PCP and then referred here

## 2022-09-09 ENCOUNTER — Other Ambulatory Visit (HOSPITAL_COMMUNITY): Payer: Self-pay

## 2022-09-22 ENCOUNTER — Other Ambulatory Visit (HOSPITAL_COMMUNITY): Payer: Self-pay

## 2022-09-22 DIAGNOSIS — R051 Acute cough: Secondary | ICD-10-CM | POA: Diagnosis not present

## 2022-09-22 DIAGNOSIS — D509 Iron deficiency anemia, unspecified: Secondary | ICD-10-CM | POA: Diagnosis not present

## 2022-09-22 DIAGNOSIS — Z8249 Family history of ischemic heart disease and other diseases of the circulatory system: Secondary | ICD-10-CM | POA: Diagnosis not present

## 2022-09-22 DIAGNOSIS — I10 Essential (primary) hypertension: Secondary | ICD-10-CM | POA: Diagnosis not present

## 2022-09-22 DIAGNOSIS — R062 Wheezing: Secondary | ICD-10-CM | POA: Diagnosis not present

## 2022-09-22 DIAGNOSIS — R7301 Impaired fasting glucose: Secondary | ICD-10-CM | POA: Diagnosis not present

## 2022-09-22 DIAGNOSIS — F331 Major depressive disorder, recurrent, moderate: Secondary | ICD-10-CM | POA: Diagnosis not present

## 2022-09-22 MED ORDER — BUPROPION HCL ER (XL) 300 MG PO TB24
300.0000 mg | ORAL_TABLET | Freq: Every morning | ORAL | 1 refills | Status: DC
  Filled 2022-09-22: qty 90, 90d supply, fill #0
  Filled 2022-12-17: qty 90, 90d supply, fill #1

## 2022-09-22 MED ORDER — BENZONATATE 100 MG PO CAPS
100.0000 mg | ORAL_CAPSULE | Freq: Three times a day (TID) | ORAL | 0 refills | Status: DC | PRN
  Filled 2022-09-22: qty 30, 10d supply, fill #0

## 2022-09-22 MED ORDER — ALBUTEROL SULFATE HFA 108 (90 BASE) MCG/ACT IN AERS
2.0000 | INHALATION_SPRAY | Freq: Four times a day (QID) | RESPIRATORY_TRACT | 0 refills | Status: DC
  Filled 2022-09-22: qty 6.7, 25d supply, fill #0

## 2022-09-22 MED ORDER — GUAIFENESIN-CODEINE 100-10 MG/5ML PO SOLN
5.0000 mL | Freq: Four times a day (QID) | ORAL | 0 refills | Status: DC
  Filled 2022-09-22: qty 140, 7d supply, fill #0

## 2022-10-06 ENCOUNTER — Other Ambulatory Visit (HOSPITAL_COMMUNITY): Payer: Self-pay

## 2022-10-06 ENCOUNTER — Ambulatory Visit: Payer: Commercial Managed Care - PPO | Admitting: Pulmonary Disease

## 2022-10-06 ENCOUNTER — Encounter: Payer: Self-pay | Admitting: Pulmonary Disease

## 2022-10-06 VITALS — BP 124/76 | HR 95 | Ht 64.0 in | Wt 169.4 lb

## 2022-10-06 DIAGNOSIS — R059 Cough, unspecified: Secondary | ICD-10-CM | POA: Diagnosis not present

## 2022-10-06 MED ORDER — FLUTICASONE-SALMETEROL 500-50 MCG/ACT IN AEPB
1.0000 | INHALATION_SPRAY | Freq: Two times a day (BID) | RESPIRATORY_TRACT | 5 refills | Status: DC
  Filled 2022-10-06: qty 60, 30d supply, fill #0
  Filled 2022-11-10: qty 60, 30d supply, fill #1
  Filled 2022-12-17: qty 60, 30d supply, fill #2

## 2022-10-06 MED ORDER — BENZONATATE 200 MG PO CAPS
200.0000 mg | ORAL_CAPSULE | Freq: Three times a day (TID) | ORAL | 1 refills | Status: DC | PRN
  Filled 2022-10-06: qty 30, 10d supply, fill #0

## 2022-10-06 NOTE — Patient Instructions (Signed)
Will prescribe Advair 500/50 Continue Flonase He can use chlorpheniramine tablets over-the-counter 4 mg 3 times a day Will also prescribe Tessalon Perles for cough Follow-up in 3 months

## 2022-10-06 NOTE — Progress Notes (Signed)
Stacey Mendoza    [id number]    08-28-1965  Primary Care Physician:Matthews, Judeth Cornfield, NP  Referring Physician: Moshe Cipro, NP [address] [place],  [place] [address]  Chief complaint: Follow-up for cough, post COVID-19  HPI: 57 y.o.  with history of hypertension, stroke Has history of cough when she was placed on lisinopril many years ago.  This improved after she was taken off this medication Developed COVID-19 in June 2019.  She did not require hospitalization.  However she had a recurrence of cough after the infection Cough is nonproductive in nature, no dyspnea, wheezing.  She has been started on albuterol and Advair by her primary care for possible reactive airway disease.  Symptoms are worse at night and on laying down Overall the cough is improving but she continues to have residual symptoms  No overt symptoms of allergy or acid reflux.  Underwent right shoulder arthroplasty in June 2022 without any respiratory issue.  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  No feather pillows or comforter Smoking history: Never smoker Travel history: No significant travel history Relevant family history: No significant family history of lung disease  Interim history: Cough had been previously well controlled but she had a follow-up after a viral infection a few months ago.  She is on codeine cough syrup is not helping She has stopped Advair inhaler at some point over the past year.  Continues on Flonase.  Outpatient Encounter Medications as of 10/06/2022  Medication Sig   albuterol (PROAIR HFA) 108 (90 Base) MCG/ACT inhaler Inhale 2 puffs into the lungs every 6 (six) hours as needed for coughing/wheezing   amLODipine (NORVASC) 10 MG tablet Take 1 tablet (10 mg total) by mouth daily   aspirin EC 81 MG EC tablet Take 1 tablet (81 mg total) by mouth daily.   atorvastatin (LIPITOR) 20 MG  tablet Take 1 tablet (20 mg total) by mouth every evening at 6 PM.   benzonatate (TESSALON) 100 MG capsule Take 1 capsule (100 mg total) by mouth 3 (three) times daily as needed   buPROPion (WELLBUTRIN XL) 300 MG 24 hr tablet Take 1 tablet (300 mg total) by mouth every morning.   cetirizine (ZYRTEC) 10 MG tablet Take 10 mg by mouth daily.   fluticasone-salmeterol (ADVAIR) 250-50 MCG/ACT AEPB Inhale 1 puff into the lungs 2 (two) times daily. Rinse mouth and spit after use.   guaiFENesin-codeine 100-10 MG/5ML syrup Take 5 mLs by mouth every 6 (six) hours as needed.   hydrochlorothiazide (HYDRODIURIL) 25 MG tablet Take 1 tablet (25 mg total) by mouth in the morning.   linaclotide (LINZESS) 290 MCG CAPS capsule Take 1 capsule (290 mcg total) by mouth daily with first meal   omeprazole (PRILOSEC) 40 MG capsule Take 1 capsule (40 mg total) by mouth in the morning and at bedtime.   famotidine (PEPCID) 20 MG tablet Take 1 tablet (20 mg total) by mouth daily for 7 days. (Patient not taking: Reported on 11/04/2021)   [DISCONTINUED] albuterol (PROAIR HFA) 108 (90 Base) MCG/ACT inhaler Inhale 2 puffs into the lungs every 6 (six) hours as needed for cough/wheezing. (Patient not taking: Reported on 08/09/2022)   [DISCONTINUED] benzonatate (TESSALON) 100 MG capsule Take 1 capsule (100 mg total) by mouth every 8 (eight) hours.   [DISCONTINUED] buPROPion (WELLBUTRIN XL) 300 MG 24 hr tablet Take 1 tablet (300 mg total) by mouth in  the morning. (Patient not taking: Reported on 08/09/2022)   [DISCONTINUED] doxycycline (VIBRAMYCIN) 100 MG capsule Take 1 capsule (100 mg total) by mouth 2 (two) times daily for 7 days (Patient not taking: Reported on 08/09/2022)   [DISCONTINUED] omeprazole (PRILOSEC) 20 MG capsule Take 1 capsule (20 mg total) by mouth daily before morning meal (Patient not taking: Reported on 08/09/2022)   [DISCONTINUED] triamcinolone cream (KENALOG) 0.1 % Apply 1 application topically 2 (two) times daily to  affected area as needed for itching for 7 days (Patient not taking: Reported on 08/09/2022)   No facility-administered encounter medications on file as of 10/06/2022.    Physical Exam: Blood pressure 124/76, pulse 95, height 5\' 4"  (1.626 m), weight 169 lb 6.4 oz (76.8 kg), SpO2 100 %. Gen:      No acute distress HEENT:  EOMI, sclera anicteric Neck:     No masses; no thyromegaly Lungs:    Clear to auscultation bilaterally; normal respiratory effort CV:         Regular rate and rhythm; no murmurs Abd:      + bowel sounds; soft, non-tender; no palpable masses, no distension Ext:    No edema; adequate peripheral perfusion Skin:      Warm and dry; no rash Neuro: alert and oriented x 3 Psych: normal mood and affect   Data Reviewed: Imaging: Chest x-ray 09/20/2017-no active cardiopulmonary disease.   Chest x-ray 06/09/2020-no acute cardiopulmonary abnormality Chest x-ray 11/04/2021-no acute cardiopulmonary abnormality Chest x-ray 09/08/2022-no evidence of acute cardiopulmonary disease. I have reviewed the images personally.  PFTs: 07/22/2020 FVC 2.74 [95%], FEV1 2.42 [26%], F/F 88, TLC 3.89 [77%], DLCO 21.91 [94%] Minimal restriction  Labs: CBC 09/23/2017-WBC 6.3, eos 1%, absolute eosinophil count 63 CBC 06/09/2020-WBC 5.4, eos 1.1%, absolute eosinophil count 59  IgE 06/09/2020-38  Assessment:  Cough Suspect upper airway cough, GERD.   She does have history of COVID-19 but no evidence of post Covid fibrosis.  PFTs with minimal restriction likely related to body habitus Suspicion for asthma is low but now she has recurrence of cough with wheezing secondary to bronchitis.  Resume Advair inhaler, continue Flonase Start chlorpheniramine over-the-counter antihistamine Tessalon Perles Continue PPI for GERD  Plan/Recommendations: Advair Steroid nasal spray, antihistamine PPI Tessalon Perles  Chilton Greathouse MD [facility] 10/06/2022, 3:32 PM  CC: Moshe Cipro, NP

## 2022-10-06 NOTE — Addendum Note (Signed)
Addended by: Wyvonne Lenz on: 10/06/2022 04:23 PM   Modules accepted: Orders

## 2022-10-12 ENCOUNTER — Other Ambulatory Visit: Payer: Self-pay

## 2022-10-12 ENCOUNTER — Other Ambulatory Visit (HOSPITAL_COMMUNITY): Payer: Self-pay

## 2022-10-14 ENCOUNTER — Other Ambulatory Visit (HOSPITAL_COMMUNITY): Payer: Self-pay

## 2022-10-14 MED ORDER — ALBUTEROL SULFATE HFA 108 (90 BASE) MCG/ACT IN AERS
2.0000 | INHALATION_SPRAY | Freq: Four times a day (QID) | RESPIRATORY_TRACT | 0 refills | Status: DC | PRN
  Filled 2022-10-14: qty 6.7, 25d supply, fill #0

## 2022-11-14 ENCOUNTER — Other Ambulatory Visit (HOSPITAL_COMMUNITY): Payer: Self-pay

## 2022-11-14 ENCOUNTER — Other Ambulatory Visit: Payer: Self-pay | Admitting: Pulmonary Disease

## 2022-11-15 ENCOUNTER — Other Ambulatory Visit (HOSPITAL_COMMUNITY): Payer: Self-pay

## 2022-11-15 MED ORDER — HYDROCHLOROTHIAZIDE 25 MG PO TABS
25.0000 mg | ORAL_TABLET | Freq: Every morning | ORAL | 1 refills | Status: DC
  Filled 2022-11-15: qty 90, 90d supply, fill #0
  Filled 2023-02-09: qty 90, 90d supply, fill #1

## 2022-11-15 MED ORDER — AMLODIPINE BESYLATE 10 MG PO TABS
10.0000 mg | ORAL_TABLET | Freq: Every day | ORAL | 1 refills | Status: DC
  Filled 2022-11-15: qty 90, 90d supply, fill #0
  Filled 2023-02-09: qty 90, 90d supply, fill #1

## 2022-11-19 ENCOUNTER — Other Ambulatory Visit (HOSPITAL_COMMUNITY): Payer: Self-pay

## 2022-11-19 MED ORDER — OMEPRAZOLE 40 MG PO CPDR
40.0000 mg | DELAYED_RELEASE_CAPSULE | Freq: Two times a day (BID) | ORAL | 5 refills | Status: DC
  Filled 2022-11-19 – 2022-12-17 (×2): qty 60, 30d supply, fill #0
  Filled 2023-03-21: qty 60, 30d supply, fill #1
  Filled 2023-05-06: qty 60, 30d supply, fill #2
  Filled 2023-06-22: qty 60, 30d supply, fill #3
  Filled 2023-08-17: qty 60, 30d supply, fill #4
  Filled 2023-10-18: qty 60, 30d supply, fill #5

## 2022-11-29 ENCOUNTER — Other Ambulatory Visit (HOSPITAL_COMMUNITY): Payer: Self-pay

## 2022-11-30 ENCOUNTER — Other Ambulatory Visit (HOSPITAL_COMMUNITY): Payer: Self-pay

## 2022-12-17 ENCOUNTER — Other Ambulatory Visit (HOSPITAL_COMMUNITY): Payer: Self-pay

## 2022-12-20 ENCOUNTER — Other Ambulatory Visit: Payer: Self-pay

## 2022-12-20 IMAGING — DX DG CHEST 2V
2 series · 2 of 2 positions shown · non-contrast
Comparison: 06/09/2020

CLINICAL DATA: Cough.

EXAM:
CHEST - 2 VIEW

[chest pa]
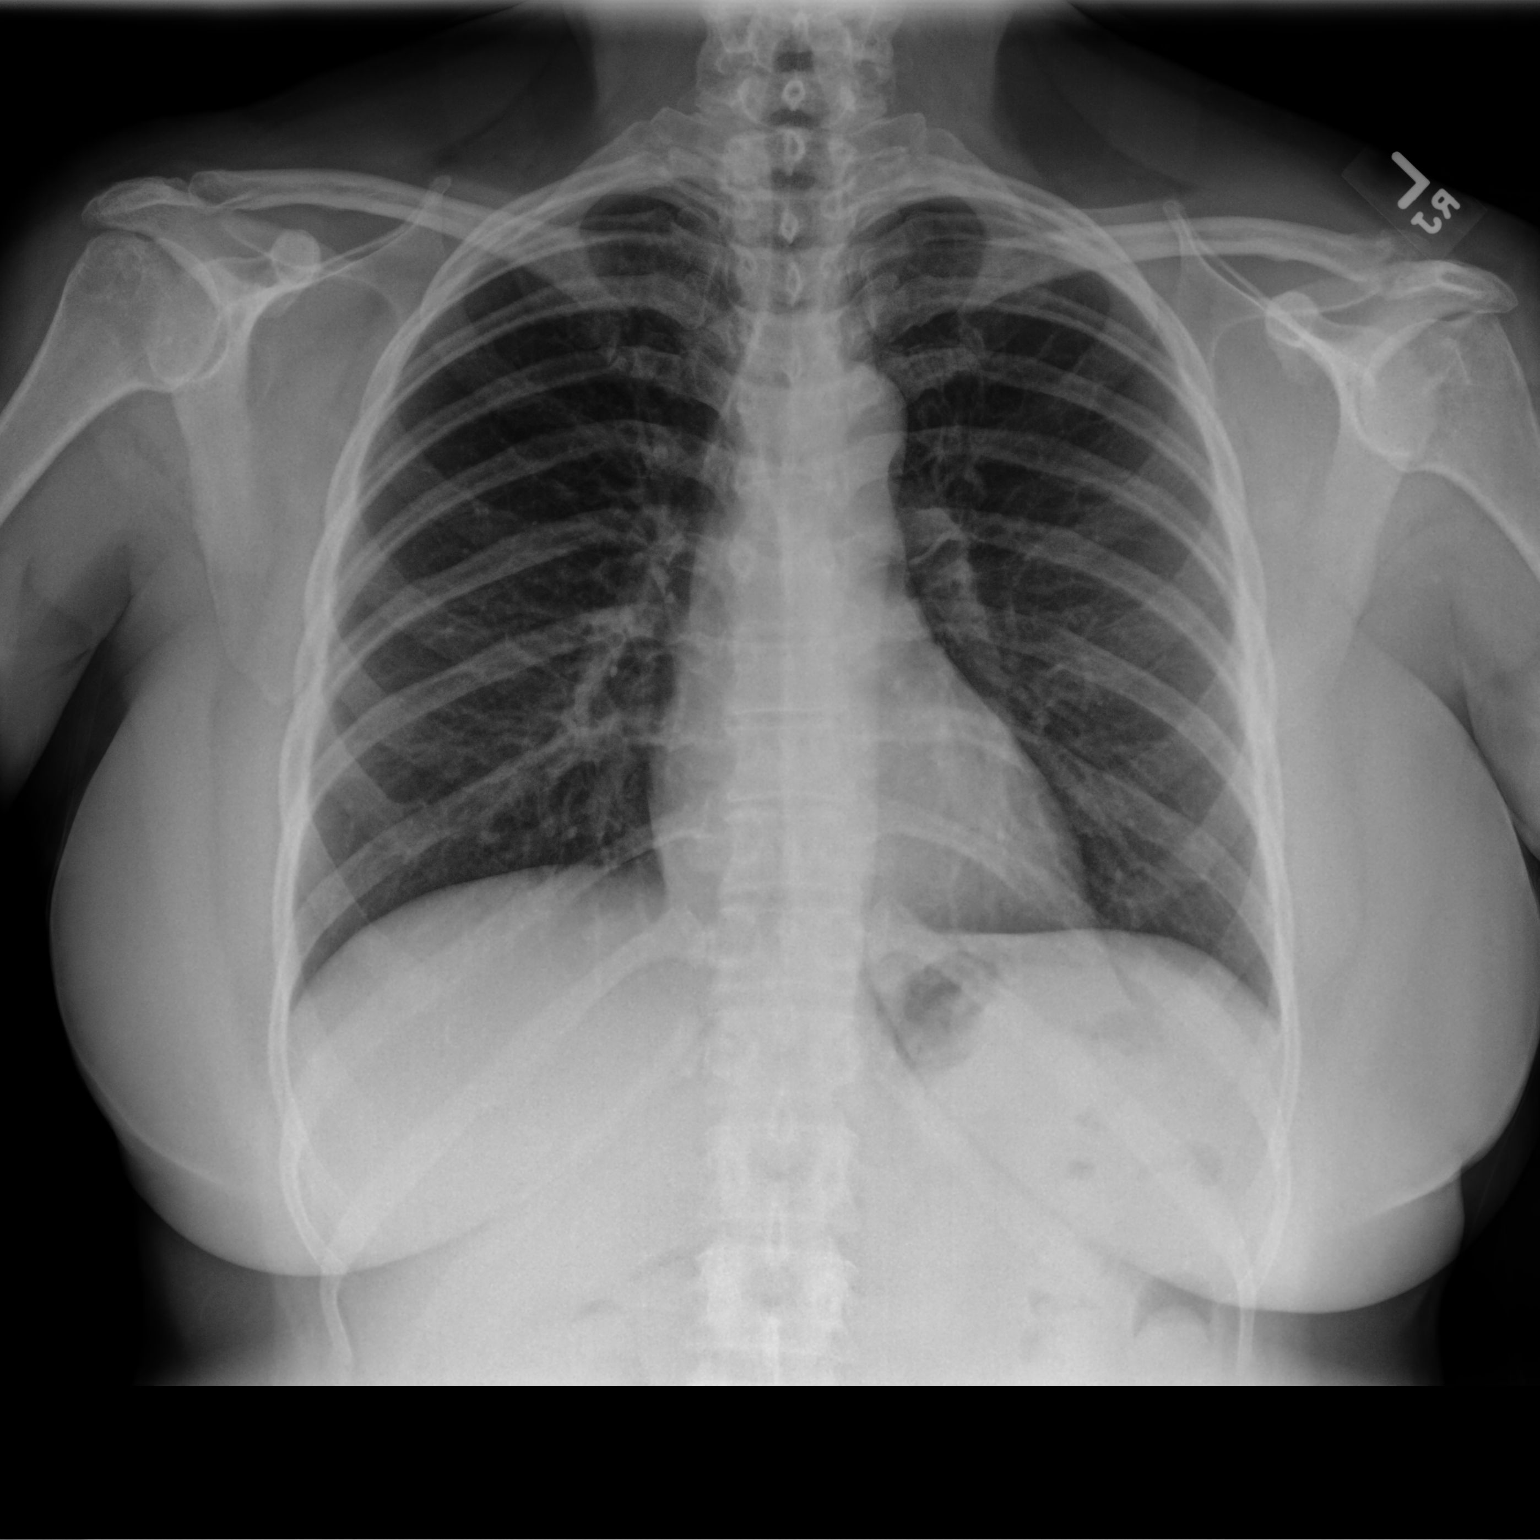

[chest lat]
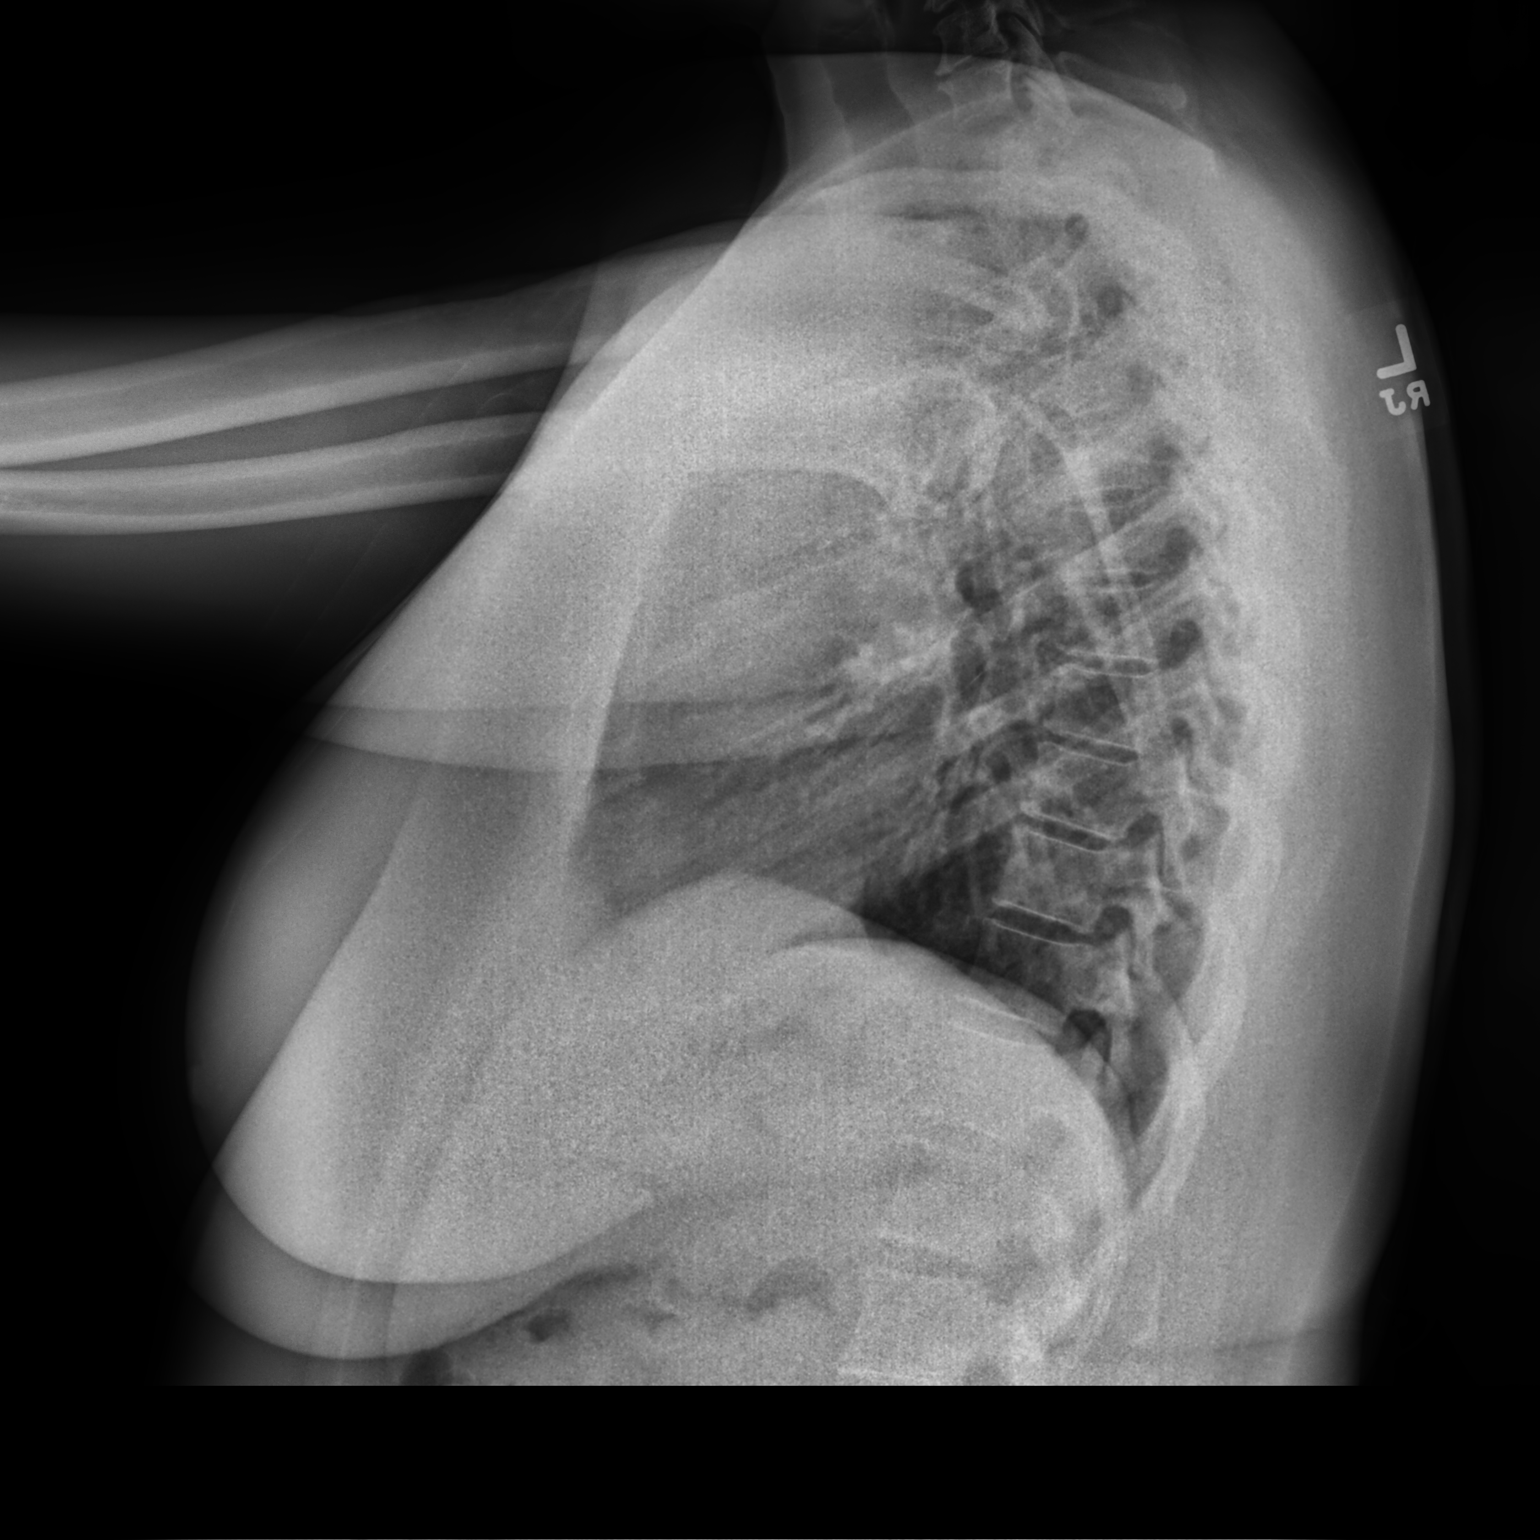

[2 of 2 positions shown; findings below may reference images not displayed]

FINDINGS: The heart size and mediastinal contours are within normal limits.
Both lungs are clear. The visualized skeletal structures are
unremarkable.
IMPRESSION: No active cardiopulmonary disease.

## 2023-01-06 ENCOUNTER — Other Ambulatory Visit (HOSPITAL_COMMUNITY): Payer: Self-pay

## 2023-01-06 ENCOUNTER — Encounter: Payer: Self-pay | Admitting: Pulmonary Disease

## 2023-01-06 ENCOUNTER — Ambulatory Visit: Payer: Commercial Managed Care - PPO | Admitting: Pulmonary Disease

## 2023-01-06 VITALS — BP 100/70 | HR 81 | Ht 64.0 in | Wt 182.0 lb

## 2023-01-06 DIAGNOSIS — R059 Cough, unspecified: Secondary | ICD-10-CM | POA: Diagnosis not present

## 2023-01-06 MED ORDER — ALBUTEROL SULFATE HFA 108 (90 BASE) MCG/ACT IN AERS
2.0000 | INHALATION_SPRAY | Freq: Four times a day (QID) | RESPIRATORY_TRACT | 11 refills | Status: AC | PRN
  Filled 2023-01-06: qty 6.7, 25d supply, fill #0

## 2023-01-06 MED ORDER — FLUTICASONE-SALMETEROL 500-50 MCG/ACT IN AEPB
1.0000 | INHALATION_SPRAY | Freq: Two times a day (BID) | RESPIRATORY_TRACT | 11 refills | Status: DC
  Filled 2023-01-06 – 2023-03-21 (×2): qty 60, 30d supply, fill #0
  Filled 2023-05-06: qty 60, 30d supply, fill #1
  Filled 2023-06-22: qty 60, 30d supply, fill #2
  Filled 2023-08-17: qty 60, 30d supply, fill #3
  Filled 2023-09-19: qty 60, 30d supply, fill #4
  Filled 2023-10-18: qty 60, 30d supply, fill #5

## 2023-01-06 NOTE — Patient Instructions (Signed)
I am glad you are doing well with your cough and your breathing Will continue current medication Follow-up in 1 year

## 2023-01-06 NOTE — Progress Notes (Signed)
Stacey Mendoza    [id number]    [birth year]  Primary Care Physician:Matthews, Judeth Cornfield, NP (Inactive)  Referring Physician: Moshe Cipro, NP [address] [place],  [place] [address]  Chief complaint: Follow-up for cough, post COVID-19  HPI: 57 y.o.  with history of hypertension, stroke Has history of cough when she was placed on lisinopril many years ago.  This improved after she was taken off this medication Developed COVID-19 in June 2019.  She did not require hospitalization.  However she had a recurrence of cough after the infection Cough is nonproductive in nature, no dyspnea, wheezing.  She has been started on albuterol and Advair by her primary care for possible reactive airway disease.  Symptoms are worse at night and on laying down Overall the cough is improving but she continues to have residual symptoms  No overt symptoms of allergy or acid reflux.  Underwent right shoulder arthroplasty in June 2022 without any respiratory issue.  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  No feather pillows or comforter Smoking history: Never smoker Travel history: No significant travel history Relevant family history: No significant family history of lung disease  Interim history: Cough had been previously well controlled but she had a follow-up after a viral infection a few months ago.  She is on codeine cough syrup is not helping She has stopped Advair inhaler at some point over the past year.  Continues on Flonase.  Outpatient Encounter Medications as of 01/06/2023  Medication Sig   albuterol (PROAIR HFA) 108 (90 Base) MCG/ACT inhaler Inhale 2 puffs into the lungs every 6 (six) hours as needed for cough/wheezing   amLODipine (NORVASC) 10 MG tablet Take 1 tablet (10 mg total) by mouth daily.   aspirin EC 81 MG EC tablet Take 1 tablet (81 mg total) by mouth daily.   atorvastatin  (LIPITOR) 20 MG tablet Take 1 tablet (20 mg total) by mouth every evening at 6 PM.   benzonatate (TESSALON) 200 MG capsule Take 1 capsule (200 mg total) by mouth 3 (three) times daily as needed for cough.   buPROPion (WELLBUTRIN XL) 300 MG 24 hr tablet Take 1 tablet (300 mg total) by mouth every morning.   cetirizine (ZYRTEC) 10 MG tablet Take 10 mg by mouth daily.   fluticasone-salmeterol (ADVAIR DISKUS) 500-50 MCG/ACT AEPB Inhale 1 puff into the lungs in the morning and at bedtime.   guaiFENesin-codeine 100-10 MG/5ML syrup Take 5 mLs by mouth every 6 (six) hours as needed.   hydrochlorothiazide (HYDRODIURIL) 25 MG tablet Take 1 tablet (25 mg total) by mouth in the morning.   linaclotide (LINZESS) 290 MCG CAPS capsule Take 1 capsule (290 mcg total) by mouth daily with first meal   omeprazole (PRILOSEC) 40 MG capsule Take 1 capsule (40 mg total) by mouth in the morning and at bedtime.   [DISCONTINUED] famotidine (PEPCID) 20 MG tablet Take 1 tablet (20 mg total) by mouth daily for 7 days. (Patient not taking: Reported on 11/04/2021)   No facility-administered encounter medications on file as of 01/06/2023.    Physical Exam: Blood pressure 124/76, pulse 95, height 5\' 4"  (1.626 m), weight 169 lb 6.4 oz (76.8 kg), SpO2 100 %. Gen:      No acute distress HEENT:  EOMI, sclera anicteric Neck:     No masses; no thyromegaly Lungs:    Clear to auscultation bilaterally; normal respiratory effort  CV:         Regular rate and rhythm; no murmurs Abd:      + bowel sounds; soft, non-tender; no palpable masses, no distension Ext:    No edema; adequate peripheral perfusion Skin:      Warm and dry; no rash Neuro: alert and oriented x 3 Psych: normal mood and affect   Data Reviewed: Imaging: Chest x-ray 09/20/2017-no active cardiopulmonary disease.   Chest x-ray 06/09/2020-no acute cardiopulmonary abnormality Chest x-ray 11/04/2021-no acute cardiopulmonary abnormality Chest x-ray 09/08/2022-no evidence of acute  cardiopulmonary disease. I have reviewed the images personally.  PFTs: 07/22/2020 FVC 2.74 [95%], FEV1 2.42 [26%], F/F 88, TLC 3.89 [77%], DLCO 21.91 [94%] Minimal restriction  Labs: CBC 09/23/2017-WBC 6.3, eos 1%, absolute eosinophil count 63 CBC 06/09/2020-WBC 5.4, eos 1.1%, absolute eosinophil count 59  IgE 06/09/2020-38  Assessment:  Cough Suspect upper airway cough, GERD.   She does have history of COVID-19 but no evidence of post Covid fibrosis.  PFTs with minimal restriction likely related to body habitus Suspicion for asthma is low but now she has recurrence of cough with wheezing secondary to bronchitis.  Resume Advair inhaler, continue Flonase Start chlorpheniramine over-the-counter antihistamine Tessalon Perles Continue PPI for GERD  Plan/Recommendations: Advair Steroid nasal spray, antihistamine PPI Tessalon Perles  Chilton Greathouse MD  Pulmonary and [facility] Care 01/06/2023, 8:45 AM  CC: Moshe Cipro, NP

## 2023-01-10 ENCOUNTER — Other Ambulatory Visit (HOSPITAL_COMMUNITY): Payer: Self-pay

## 2023-01-13 ENCOUNTER — Other Ambulatory Visit (HOSPITAL_COMMUNITY): Payer: Self-pay

## 2023-01-13 ENCOUNTER — Encounter: Payer: Self-pay | Admitting: Radiology

## 2023-01-13 ENCOUNTER — Ambulatory Visit (INDEPENDENT_AMBULATORY_CARE_PROVIDER_SITE_OTHER): Payer: Commercial Managed Care - PPO | Admitting: Radiology

## 2023-01-13 ENCOUNTER — Other Ambulatory Visit (HOSPITAL_COMMUNITY)
Admission: RE | Admit: 2023-01-13 | Discharge: 2023-01-13 | Disposition: A | Discharge status: Home / Self Care | Payer: Commercial Managed Care - PPO | Source: Ambulatory Visit | Attending: Radiology | Admitting: Radiology

## 2023-01-13 VITALS — BP 132/84 | Ht 64.5 in | Wt 179.0 lb

## 2023-01-13 DIAGNOSIS — B3731 Acute candidiasis of vulva and vagina: Secondary | ICD-10-CM | POA: Diagnosis not present

## 2023-01-13 DIAGNOSIS — Z01419 Encounter for gynecological examination (general) (routine) without abnormal findings: Secondary | ICD-10-CM

## 2023-01-13 DIAGNOSIS — N760 Acute vaginitis: Secondary | ICD-10-CM | POA: Diagnosis not present

## 2023-01-13 DIAGNOSIS — N898 Other specified noninflammatory disorders of vagina: Secondary | ICD-10-CM

## 2023-01-13 DIAGNOSIS — N951 Menopausal and female climacteric states: Secondary | ICD-10-CM

## 2023-01-13 LAB — WET PREP FOR TRICH, YEAST, CLUE

## 2023-01-13 MED ORDER — VEOZAH 45 MG PO TABS
1.0000 | ORAL_TABLET | Freq: Every day | ORAL | 2 refills | Status: AC
  Filled 2023-01-13 – 2023-01-21 (×2): qty 30, 30d supply, fill #0
  Filled 2023-03-21: qty 30, 30d supply, fill #1
  Filled 2023-05-06: qty 30, 30d supply, fill #2

## 2023-01-13 MED ORDER — FLUCONAZOLE 150 MG PO TABS
150.0000 mg | ORAL_TABLET | ORAL | 0 refills | Status: DC
  Filled 2023-01-13: qty 2, 6d supply, fill #0

## 2023-01-13 MED ORDER — METRONIDAZOLE 500 MG PO TABS
500.0000 mg | ORAL_TABLET | Freq: Two times a day (BID) | ORAL | 0 refills | Status: DC
  Filled 2023-01-13: qty 14, 7d supply, fill #0

## 2023-01-13 NOTE — Progress Notes (Signed)
   Stacey Mendoza [birth year] [id number]   History: Postmenopausal 57 y.o. presents for annual exam. C/o vaginal d/c with odor after using new bath products. Worsening hot flashes and night sweats, not sleeping. Not a candidate for estrogen, hx of TIAs. Sister had breast cancer. Interested in other options that may help.   Gynecologic History Postmenopausal Last Pap: 2021. Results were: normal Last mammogram: 3/24. Results were: normal Last colonoscopy: 2021 per pt, no records   Obstetric History OB History  Gravida Para Term Preterm AB Living  3 3   3   3   SAB IAB Ectopic Multiple Live Births               # Outcome Date GA Lbr Len/2nd Weight Sex Type Anes PTL Lv  3 Preterm           2 Preterm           1 Preterm              The following portions of the patient's history were reviewed and updated as appropriate: allergies, current medications, past family history, past medical history, past social history, past surgical history, and problem list.  Review of Systems Pertinent items noted in HPI and remainder of comprehensive ROS otherwise negative.  Past medical history, past surgical history, family history and social history were all reviewed and documented in the EPIC chart.  Exam:  Vitals:   01/13/23 1402  BP: 132/84  Weight: 179 lb (81.2 kg)  Height: 5' 4.5" (1.638 m)   Body mass index is 30.25 kg/m.  General appearance:  Normal Thyroid:  Symmetrical, normal in size, without palpable masses or nodularity. Respiratory  Auscultation:  Clear without wheezing or rhonchi Cardiovascular  Auscultation:  Regular rate, without rubs, murmurs or gallops  Edema/varicosities:  Not grossly evident Abdominal  Soft,nontender, without masses, guarding or rebound.  Liver/spleen:  No organomegaly noted  Hernia:  None appreciated  Skin  Inspection:  Grossly normal Breasts: Examined lying and sitting.   Right: Without masses, retractions, nipple discharge or axillary  adenopathy.   Left: Without masses, retractions, nipple discharge or axillary adenopathy. Genitourinary   Inguinal/mons:  Normal without inguinal adenopathy  External genitalia:  Normal appearing vulva with no masses, tenderness, or lesions  BUS/Urethra/Skene's glands:  Normal  Vagina:  Normal appearing with normal color and discharge, no lesions.   Cervix:  Normal appearing without discharge or lesions  Uterus:  Normal in size, shape and contour.  Midline and mobile, nontender  Adnexa/parametria:     Rt: Normal in size, without masses or tenderness.   Lt: Normal in size, without masses or tenderness.  Anus and perineum: Normal    Raynelle Fanning, CMA present for exam  Assessment/Plan:   1. Well woman exam with routine gynecological exam - Cytology - PAP( [facility])  2. Vaginal discharge - WET PREP FOR TRICH, YEAST, CLUE +BV and yeast, rx sent for flagyl and fluconazole  3. Menopausal vasomotor syndrome - Hepatic function panel - Fezolinetant (VEOZAH) 45 MG TABS; Take 1 tablet (45 mg total) by mouth daily.  Dispense: 30 tablet; Refill: 2   Discussed SBE, colonoscopy and DEXA screening as directed. Recommend of exercise weekly, including weight bearing exercise. Encouraged the use of seatbelts and sunscreen.  Return in 1 year for annual or sooner prn.  Arlie Solomons B WHNP-BC, 2:23 PM 01/13/2023

## 2023-01-14 ENCOUNTER — Telehealth: Payer: Self-pay

## 2023-01-14 ENCOUNTER — Other Ambulatory Visit (HOSPITAL_COMMUNITY): Payer: Self-pay

## 2023-01-14 LAB — HEPATIC FUNCTION PANEL
AG Ratio: 1.6 (calc) (ref 1.0–2.5)
ALT: 19 U/L (ref 6–29)
AST: 18 U/L (ref 10–35)
Albumin: 4.4 g/dL (ref 3.6–5.1)
Alkaline phosphatase (APISO): 94 U/L (ref 37–153)
Bilirubin, Direct: 0.1 mg/dL (ref 0.0–0.2)
Globulin: 2.8 g/dL (ref 1.9–3.7)
Indirect Bilirubin: 0.5 mg/dL (ref 0.2–1.2)
Total Bilirubin: 0.6 mg/dL (ref 0.2–1.2)
Total Protein: 7.2 g/dL (ref 6.1–8.1)

## 2023-01-14 NOTE — Telephone Encounter (Signed)
PA initiated through covermymeds for [employer]. Patient informed. KEYDaniel Mendoza Dx: N95.1 menopause symptoms Hx TIA not candidate for estrogen

## 2023-01-14 NOTE — Telephone Encounter (Signed)
PA approved for [employer] .  Patient informed.

## 2023-01-20 ENCOUNTER — Telehealth: Payer: Self-pay

## 2023-01-20 LAB — CYTOLOGY - PAP
Adequacy: ABSENT
Comment: NEGATIVE
Diagnosis: UNDETERMINED — AB
High risk HPV: NEGATIVE

## 2023-01-20 NOTE — Telephone Encounter (Signed)
I would recommend visit for wet prep

## 2023-01-20 NOTE — Telephone Encounter (Signed)
 Left message to call [facility] Triage at [phone], option 4.

## 2023-01-20 NOTE — Telephone Encounter (Signed)
When speaking to pt about her pap test results, she had a few questions. One was about her veozah rx, she hadn't heard from her pharmacy since being informed that her veozah was approved through her insurance. Pt advised to call them and let them know it was approved and inquire as to if she will have any kind of OOP cost or copay for it. Pt voiced understanding.  Pt also mentioned being treated for yeast and BV at most recent visit. However, states that she says that she still does feel as if she is completely comfortable. States still having a mild odor. Pt reports taking a bath with bleach 3x a week and washing with scented soaps from [employer]. Pt notified that should only be washing/rinsing vaginal/vulvar area either with water or external only with a mild/sensitive/non scented soap if needed or will wash away good bacteria or mess with PH balance and allow the bad bacteria to grow causing odors. Pt advised to make appt if she would like to be retested but pt wanted me to inquire with provider first.   Please advise.

## 2023-01-21 ENCOUNTER — Other Ambulatory Visit (HOSPITAL_COMMUNITY): Payer: Self-pay

## 2023-01-21 ENCOUNTER — Ambulatory Visit: Payer: Commercial Managed Care - PPO | Admitting: Radiology

## 2023-01-21 VITALS — BP 136/88

## 2023-01-21 DIAGNOSIS — N760 Acute vaginitis: Secondary | ICD-10-CM | POA: Diagnosis not present

## 2023-01-21 LAB — WET PREP FOR TRICH, YEAST, CLUE

## 2023-01-21 NOTE — Telephone Encounter (Signed)
Pt scheduled for 01/21/2023 @ 215. Routing to provider and closing encounter.

## 2023-01-21 NOTE — Progress Notes (Signed)
      Subjective: Stacey Mendoza is a 57 y.o. female Concerned she still has vaginal infection (bathes in bleach 3 x's per week--stopped last week). Denies vaginal symptoms today. Treated for BV and yeast after pap last week.    Review of Systems  All other systems reviewed and are negative.   Past Medical History:  Diagnosis Date   Abdominal pain, chronic, right upper quadrant 11/28/2013   Abdominal pain, right lower quadrant 06/29/2013   Anemia    Bulging disc    cervical MRI pending   Chest pain 09/21/2017   Dyspepsia and other specified disorders of function of stomach 04/10/2013   Elevated LFTs    Family history of breast cancer in female 11/28/2013   Hypertension    Hypokalemia 09/21/2017   Midepigastric pain 10/23/2012   Migraine headache 06/13/2013   Nonspecific abnormal results of liver function study 06/29/2013   Normal vaginal delivery 1992, 1986, 1990   TIA (transient ischemic attack) 09/21/2017   Unspecified constipation 10/23/2012      Objective:  Today's Vitals   01/21/23 1414  BP: 136/88   There is no height or weight on file to calculate BMI.   -General: no acute distress -Vulva: without lesions or discharge -Vagina: scant clear discharge present, wet prep obtained -Cervix: no lesion or discharge, no CMT -Perineum: no lesions -Uterus: Mobile, non tender -Adnexa: no masses or tenderness   Microscopic wet-mount exam shows negative for pathogens, normal epithelial cells.   Raynelle Fanning, CMA present for exam  Assessment:/Plan:  1. Acute vaginitis Reassure negative - WET PREP FOR TRICH, YEAST, CLUE   Avoid bleach baths. Avoid intercourse until symptoms are resolved. Safe sex encouraged. Avoid the use of soaps or perfumed products in the peri area. Avoid tub baths and sitting in sweaty or wet clothing for prolonged periods of time.

## 2023-01-21 NOTE — Telephone Encounter (Signed)
Pt notified and voiced understanding. Msg sent to appt desk.

## 2023-01-27 ENCOUNTER — Other Ambulatory Visit (HOSPITAL_COMMUNITY): Payer: Self-pay

## 2023-01-28 ENCOUNTER — Other Ambulatory Visit (HOSPITAL_COMMUNITY): Payer: Self-pay

## 2023-02-04 ENCOUNTER — Other Ambulatory Visit (HOSPITAL_COMMUNITY): Payer: Self-pay

## 2023-02-09 ENCOUNTER — Other Ambulatory Visit (HOSPITAL_COMMUNITY): Payer: Self-pay

## 2023-02-10 ENCOUNTER — Other Ambulatory Visit (HOSPITAL_COMMUNITY): Payer: Self-pay

## 2023-03-21 ENCOUNTER — Other Ambulatory Visit: Payer: Self-pay

## 2023-03-21 ENCOUNTER — Other Ambulatory Visit (HOSPITAL_COMMUNITY): Payer: Self-pay

## 2023-03-21 ENCOUNTER — Encounter (HOSPITAL_COMMUNITY): Payer: Self-pay

## 2023-03-28 ENCOUNTER — Other Ambulatory Visit (HOSPITAL_COMMUNITY): Payer: Self-pay

## 2023-03-28 DIAGNOSIS — G4700 Insomnia, unspecified: Secondary | ICD-10-CM | POA: Diagnosis not present

## 2023-03-28 DIAGNOSIS — F331 Major depressive disorder, recurrent, moderate: Secondary | ICD-10-CM | POA: Diagnosis not present

## 2023-03-28 DIAGNOSIS — Z6830 Body mass index (BMI) 30.0-30.9, adult: Secondary | ICD-10-CM | POA: Diagnosis not present

## 2023-03-28 DIAGNOSIS — K5901 Slow transit constipation: Secondary | ICD-10-CM | POA: Diagnosis not present

## 2023-03-28 DIAGNOSIS — L299 Pruritus, unspecified: Secondary | ICD-10-CM | POA: Diagnosis not present

## 2023-03-28 MED ORDER — TEMAZEPAM 15 MG PO CAPS
15.0000 mg | ORAL_CAPSULE | Freq: Every evening | ORAL | 2 refills | Status: DC | PRN
  Filled 2023-03-28: qty 30, 30d supply, fill #0

## 2023-03-28 MED ORDER — BUPROPION HCL ER (XL) 300 MG PO TB24
300.0000 mg | ORAL_TABLET | Freq: Every morning | ORAL | 3 refills | Status: DC
  Filled 2023-03-28: qty 90, 90d supply, fill #0
  Filled 2023-06-22: qty 90, 90d supply, fill #1
  Filled 2023-08-25 – 2023-09-13 (×2): qty 90, 90d supply, fill #2

## 2023-03-28 MED ORDER — TRIAMCINOLONE ACETONIDE 0.1 % EX CREA
1.0000 | TOPICAL_CREAM | Freq: Two times a day (BID) | CUTANEOUS | 2 refills | Status: AC | PRN
  Filled 2023-03-28: qty 45, 23d supply, fill #0

## 2023-03-28 MED ORDER — LINZESS 290 MCG PO CAPS
290.0000 ug | ORAL_CAPSULE | Freq: Every day | ORAL | 5 refills | Status: AC
  Filled 2023-03-28: qty 60, 60d supply, fill #0
  Filled 2023-06-22: qty 60, 60d supply, fill #1
  Filled 2023-09-19: qty 60, 60d supply, fill #2
  Filled 2023-11-16: qty 60, 60d supply, fill #3
  Filled 2024-01-15: qty 60, 60d supply, fill #4

## 2023-03-29 ENCOUNTER — Other Ambulatory Visit (HOSPITAL_COMMUNITY): Payer: Self-pay

## 2023-04-11 ENCOUNTER — Ambulatory Visit: Payer: Commercial Managed Care - PPO | Admitting: Radiology

## 2023-04-21 DIAGNOSIS — H524 Presbyopia: Secondary | ICD-10-CM | POA: Diagnosis not present

## 2023-04-21 DIAGNOSIS — H5213 Myopia, bilateral: Secondary | ICD-10-CM | POA: Diagnosis not present

## 2023-04-21 DIAGNOSIS — H52221 Regular astigmatism, right eye: Secondary | ICD-10-CM | POA: Diagnosis not present

## 2023-05-06 ENCOUNTER — Other Ambulatory Visit: Payer: Self-pay

## 2023-05-06 ENCOUNTER — Other Ambulatory Visit (HOSPITAL_COMMUNITY): Payer: Self-pay

## 2023-05-09 ENCOUNTER — Encounter (HOSPITAL_COMMUNITY): Payer: Self-pay

## 2023-05-09 ENCOUNTER — Other Ambulatory Visit (HOSPITAL_COMMUNITY): Payer: Self-pay

## 2023-05-16 ENCOUNTER — Encounter (HOSPITAL_COMMUNITY): Payer: Self-pay

## 2023-05-16 ENCOUNTER — Other Ambulatory Visit (HOSPITAL_COMMUNITY): Payer: Self-pay

## 2023-05-17 ENCOUNTER — Other Ambulatory Visit (HOSPITAL_COMMUNITY): Payer: Self-pay

## 2023-05-17 MED ORDER — AMLODIPINE BESYLATE 10 MG PO TABS
10.0000 mg | ORAL_TABLET | Freq: Every day | ORAL | 1 refills | Status: DC
  Filled 2023-05-17: qty 90, 90d supply, fill #0
  Filled 2023-08-17: qty 90, 90d supply, fill #1

## 2023-05-17 MED ORDER — HYDROCHLOROTHIAZIDE 25 MG PO TABS
25.0000 mg | ORAL_TABLET | Freq: Every morning | ORAL | 1 refills | Status: DC
  Filled 2023-05-17: qty 90, 90d supply, fill #0
  Filled 2023-08-17: qty 90, 90d supply, fill #1

## 2023-05-31 ENCOUNTER — Emergency Department (HOSPITAL_COMMUNITY)
Admission: EM | Admit: 2023-05-31 | Discharge: 2023-05-31 | Disposition: A | Discharge status: Home / Self Care | Payer: Commercial Managed Care - PPO | Attending: Emergency Medicine | Admitting: Emergency Medicine

## 2023-05-31 ENCOUNTER — Other Ambulatory Visit: Payer: Self-pay

## 2023-05-31 ENCOUNTER — Emergency Department (HOSPITAL_COMMUNITY): Payer: Commercial Managed Care - PPO

## 2023-05-31 DIAGNOSIS — R531 Weakness: Secondary | ICD-10-CM | POA: Insufficient documentation

## 2023-05-31 DIAGNOSIS — Z8673 Personal history of transient ischemic attack (TIA), and cerebral infarction without residual deficits: Secondary | ICD-10-CM | POA: Diagnosis not present

## 2023-05-31 DIAGNOSIS — R110 Nausea: Secondary | ICD-10-CM | POA: Insufficient documentation

## 2023-05-31 DIAGNOSIS — R29818 Other symptoms and signs involving the nervous system: Secondary | ICD-10-CM | POA: Diagnosis not present

## 2023-05-31 DIAGNOSIS — Z79899 Other long term (current) drug therapy: Secondary | ICD-10-CM | POA: Insufficient documentation

## 2023-05-31 DIAGNOSIS — R519 Headache, unspecified: Secondary | ICD-10-CM | POA: Diagnosis not present

## 2023-05-31 DIAGNOSIS — G43109 Migraine with aura, not intractable, without status migrainosus: Secondary | ICD-10-CM | POA: Diagnosis not present

## 2023-05-31 DIAGNOSIS — I6782 Cerebral ischemia: Secondary | ICD-10-CM | POA: Diagnosis not present

## 2023-05-31 DIAGNOSIS — I10 Essential (primary) hypertension: Secondary | ICD-10-CM | POA: Insufficient documentation

## 2023-05-31 DIAGNOSIS — Z7982 Long term (current) use of aspirin: Secondary | ICD-10-CM | POA: Diagnosis not present

## 2023-05-31 DIAGNOSIS — E876 Hypokalemia: Secondary | ICD-10-CM | POA: Diagnosis not present

## 2023-05-31 LAB — COMPREHENSIVE METABOLIC PANEL
ALT: 24 U/L (ref 0–44)
AST: 20 U/L (ref 15–41)
Albumin: 4.3 g/dL (ref 3.5–5.0)
Alkaline Phosphatase: 87 U/L (ref 38–126)
Anion gap: 13 (ref 5–15)
BUN: 17 mg/dL (ref 6–20)
CO2: 23 mmol/L (ref 22–32)
Calcium: 9.9 mg/dL (ref 8.9–10.3)
Chloride: 102 mmol/L (ref 98–111)
Creatinine, Ser: 0.83 mg/dL (ref 0.44–1.00)
GFR, Estimated: >60 mL/min (ref >60)
Glucose, Bld: 109 mg/dL — ABNORMAL HIGH (ref 70–99)
Potassium: 3.5 mmol/L (ref 3.5–5.1)
Sodium: 138 mmol/L (ref 135–145)
Total Bilirubin: 0.8 mg/dL (ref 0.0–1.2)
Total Protein: 7.5 g/dL (ref 6.5–8.1)

## 2023-05-31 LAB — DIFFERENTIAL
Abs Immature Granulocytes: 0.01 10*3/uL (ref 0.00–0.07)
Basophils Absolute: 0 10*3/uL (ref 0.0–0.1)
Basophils Relative: 0 %
Eosinophils Absolute: 0 10*3/uL (ref 0.0–0.5)
Eosinophils Relative: 0 %
Immature Granulocytes: 0 %
Lymphocytes Relative: 36 %
Lymphs Abs: 1.8 10*3/uL (ref 0.7–4.0)
Monocytes Absolute: 0.4 10*3/uL (ref 0.1–1.0)
Monocytes Relative: 8 %
Neutro Abs: 2.8 10*3/uL (ref 1.7–7.7)
Neutrophils Relative %: 56 %

## 2023-05-31 LAB — CBC
HCT: 38.6 % (ref 36.0–46.0)
Hemoglobin: 12.4 g/dL (ref 12.0–15.0)
MCH: 22.8 pg — ABNORMAL LOW (ref 26.0–34.0)
MCHC: 32.1 g/dL (ref 30.0–36.0)
MCV: 70.8 fL — ABNORMAL LOW (ref 80.0–100.0)
Platelets: 145 10*3/uL — ABNORMAL LOW (ref 150–400)
RBC: 5.45 MIL/uL — ABNORMAL HIGH (ref 3.87–5.11)
RDW: 16 % — ABNORMAL HIGH (ref 11.5–15.5)
WBC: 5 10*3/uL (ref 4.0–10.5)
nRBC: 0 % (ref 0.0–0.2)

## 2023-05-31 LAB — APTT: aPTT: 28 s (ref 24–36)

## 2023-05-31 LAB — MAGNESIUM: Magnesium: 2.1 mg/dL (ref 1.7–2.4)

## 2023-05-31 LAB — I-STAT CHEM 8, ED
BUN: 18 mg/dL (ref 6–20)
Calcium, Ion: 1.15 mmol/L (ref 1.15–1.40)
Chloride: 103 mmol/L (ref 98–111)
Creatinine, Ser: 0.9 mg/dL (ref 0.44–1.00)
Glucose, Bld: 115 mg/dL — ABNORMAL HIGH (ref 70–99)
HCT: 40 % (ref 36.0–46.0)
Hemoglobin: 13.6 g/dL (ref 12.0–15.0)
Potassium: 3.4 mmol/L — ABNORMAL LOW (ref 3.5–5.1)
Sodium: 139 mmol/L (ref 135–145)
TCO2: 24 mmol/L (ref 22–32)

## 2023-05-31 LAB — PROTIME-INR
INR: 1 (ref 0.8–1.2)
Prothrombin Time: 13.2 s (ref 11.4–15.2)

## 2023-05-31 LAB — ETHANOL: Alcohol, Ethyl (B): <10 mg/dL (ref <10)

## 2023-05-31 MED ORDER — METOCLOPRAMIDE HCL 5 MG/ML IJ SOLN
10.0000 mg | Freq: Once | INTRAMUSCULAR | Status: AC
  Administered 2023-05-31: 10 mg via INTRAVENOUS
  Filled 2023-05-31: qty 2

## 2023-05-31 MED ORDER — ONDANSETRON HCL 4 MG/2ML IJ SOLN
INTRAMUSCULAR | Status: AC
  Filled 2023-05-31: qty 2

## 2023-05-31 MED ORDER — SODIUM CHLORIDE 0.9% FLUSH
3.0000 mL | Freq: Once | INTRAVENOUS | Status: AC
  Administered 2023-05-31: 3 mL via INTRAVENOUS

## 2023-05-31 MED ORDER — POTASSIUM CHLORIDE CRYS ER 20 MEQ PO TBCR
40.0000 meq | EXTENDED_RELEASE_TABLET | Freq: Once | ORAL | Status: AC
  Administered 2023-05-31: 40 meq via ORAL
  Filled 2023-05-31: qty 2

## 2023-05-31 MED ORDER — DIPHENHYDRAMINE HCL 50 MG/ML IJ SOLN
12.5000 mg | Freq: Once | INTRAMUSCULAR | Status: AC
  Administered 2023-05-31: 12.5 mg via INTRAVENOUS
  Filled 2023-05-31: qty 1

## 2023-05-31 MED ORDER — LACTATED RINGERS IV BOLUS
1000.0000 mL | Freq: Once | INTRAVENOUS | Status: AC
  Administered 2023-05-31: 1000 mL via INTRAVENOUS

## 2023-05-31 MED ORDER — KETOROLAC TROMETHAMINE 15 MG/ML IJ SOLN
15.0000 mg | Freq: Once | INTRAMUSCULAR | Status: AC
  Administered 2023-05-31: 15 mg via INTRAVENOUS
  Filled 2023-05-31: qty 1

## 2023-05-31 MED ORDER — ONDANSETRON HCL 4 MG/2ML IJ SOLN
4.0000 mg | Freq: Once | INTRAMUSCULAR | Status: AC
  Administered 2023-05-31: 4 mg via INTRAVENOUS

## 2023-05-31 NOTE — Consult Note (Signed)
 NEUROLOGY CONSULT NOTE   Date of service: May 31, 2023 Patient Name: Stacey Mendoza MRN:  [id number] DOB:  1965-07-06 Chief Complaint: CODE STROKE Requesting Provider: Melvenia Motto, MD  History of Present Illness  Stacey Mendoza is a 58 y.o. female  has a past medical history of Abdominal pain, chronic, right upper quadrant (11/28/2013), Abdominal pain, right lower quadrant (06/29/2013), Anemia, Bulging disc, Chest pain (09/21/2017), Dyspepsia and other specified disorders of function of stomach (04/10/2013), Elevated LFTs, Family history of breast cancer in female (11/28/2013), Hypertension, Hypokalemia (09/21/2017), Midepigastric pain (10/23/2012), Migraine headache (06/13/2013), Nonspecific abnormal results of liver function study (06/29/2013), Normal vaginal delivery (1992, 1986, 1990), TIA (transient ischemic attack) (09/21/2017), and Unspecified constipation (10/23/2012).   Who presented from work at [employer] [employer] [facility] [employer] [facility] to ED with c/o left-side weakness (leg>arm), headache, nausea, sensory changes.  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  She said that this morning she did not have much to eat or drink because she was quite nauseous.  She then had a sudden onset of left-sided weakness.  She reports that she has had a mini stroke few years ago.  She is currently on antihypertensives at home, as well as baby aspirin .   LKW: 1135 Modified rankin score: 0-Completely asymptomatic and back to baseline post- stroke IV Thrombolysis: Somewhat effort dependent exam, rushed to stat MRI to ensure no stroke-MRI negative EVT: No stroke  NIHSS components Score: Comment  1a Level of Conscious 0[x]  1[]  2[]  3[]      1b LOC Questions 0[x]  1[]  2[]       1c LOC Commands 0[x]  1[]  2[]       2 Best Gaze 0[x]  1[]  2[]       3 Visual 0[x]  1[]  2[]  3[]      4 Facial Palsy 0[x]  1[]  2[]  3[]      5a Motor Arm - left 0[]  1[x]  2[]   3[]  4[]  UN[]    5b Motor Arm - Right 0[x]  1[]  2[]  3[]  4[]  UN[]    6a Motor Leg - Left 0[]  1[x]  2[]  3[]  4[]  UN[]    6b Motor Leg - Right 0[x]  1[]  2[]  3[]  4[]  UN[]    7 Limb Ataxia 0[x]  1[]  2[]  3[]  UN[]     8 Sensory 0[]  1[x]  2[]  UN[]      9 Best Language 0[x]  1[]  2[]  3[]      10 Dysarthria 0[x]  1[]  2[]  UN[]      11 Extinct. and Inattention 0[x]  1[]  2[]       TOTAL: 3      ROS  Comprehensive ROS performed and pertinent positives documented in HPI    Past History   Past Medical History:  Diagnosis Date   Abdominal pain, chronic, right upper quadrant 11/28/2013   Abdominal pain, right lower quadrant 06/29/2013   Anemia    Bulging disc    cervical MRI pending   Chest pain 09/21/2017   Dyspepsia and other specified disorders of function of stomach 04/10/2013   Elevated LFTs    Family history of breast cancer in female 11/28/2013   Hypertension    Hypokalemia 09/21/2017   Midepigastric pain 10/23/2012   Migraine headache 06/13/2013   Nonspecific abnormal results of liver function study 06/29/2013   Normal vaginal delivery 1992, 1986, 1990   TIA (transient ischemic attack) 09/21/2017   Unspecified constipation 10/23/2012    Past Surgical History:  Procedure Laterality Date   CHOLECYSTECTOMY N/A 07/06/2017   Procedure: LAPAROSCOPIC CHOLECYSTECTOMY;  Surgeon:  Kinsinger, Herlene Righter, MD;  Location: [facility];  Service: General;  Laterality: N/A;   ROTATOR CUFF REPAIR Right 11/04/2020   TUBAL LIGATION      Family History: Family History  Problem Relation Age of Onset   Heart disease Father        HEART ATTACK   Breast cancer Sister 44   Cancer Sister        LIVER   Hypertension Sister    Hypertension Mother    Diabetes Mother    Heart disease Mother    Throat cancer Mother    Hypertension Brother    Cancer Other 25       LIVER   Hypertension Sister    Hypertension Sister    Colon cancer Neg Hx     Social History  reports that she has never smoked. She has never been exposed to tobacco  smoke. She has never used smokeless tobacco. She reports that she does not drink alcohol and does not use drugs.  Allergies  Allergen Reactions   Hydrocodone -Acetaminophen      HALLUCINATIONS, pt has since tolerated oxycodone  w/o hallucinations    Medications   Current Facility-Administered Medications:    ondansetron  (ZOFRAN ) injection 4 mg, 4 mg, Intravenous, Once, Judithe, Erin C, NP   sodium chloride  flush (NS) 0.9 % injection 3 mL, 3 mL, Intravenous, Once, Melvenia Motto, MD  Current Outpatient Medications:    albuterol  (PROAIR  HFA) 108 (90 Base) MCG/ACT inhaler, Inhale 2 puffs into the lungs every 6 (six) hours as needed for cough/wheezing, Disp: 6.7 g, Rfl: 11   amLODipine  (NORVASC ) 10 MG tablet, Take 1 tablet (10 mg total) by mouth daily., Disp: 90 tablet, Rfl: 1   aspirin  EC 81 MG EC tablet, Take 1 tablet (81 mg total) by mouth daily., Disp: 30 tablet, Rfl: 0   atorvastatin  (LIPITOR) 20 MG tablet, Take 1 tablet (20 mg total) by mouth every evening at 6 PM., Disp: 90 tablet, Rfl: 2   buPROPion  (WELLBUTRIN  XL) 300 MG 24 hr tablet, Take 1 tablet (300 mg total) by mouth in the morning., Disp: 90 tablet, Rfl: 3   cetirizine (ZYRTEC) 10 MG tablet, Take 10 mg by mouth daily., Disp: , Rfl:    Fezolinetant  (VEOZAH ) 45 MG TABS, Take 1 tablet (45 mg total) by mouth daily., Disp: 30 tablet, Rfl: 2   fluconazole  (DIFLUCAN ) 150 MG tablet, Take 1 tablet (150 mg total) by mouth every 3 (three) days. (Patient not taking: Reported on 01/21/2023), Disp: 2 tablet, Rfl: 0   fluticasone -salmeterol (ADVAIR  DISKUS) 500-50 MCG/ACT AEPB, Inhale 1 puff into the lungs in the morning and at bedtime., Disp: 60 each, Rfl: 11   hydrochlorothiazide  (HYDRODIURIL ) 25 MG tablet, Take 1 tablet (25 mg total) by mouth in the morning., Disp: 90 tablet, Rfl: 1   linaclotide  (LINZESS ) 290 MCG CAPS capsule, Take 1 capsule (290 mcg total) by mouth daily with first meal, Disp: 90 capsule, Rfl: 3   linaclotide  (LINZESS ) 290 MCG CAPS  capsule, Take 1 capsule (290 mcg total) by mouth daily with breakfast., Disp: 60 capsule, Rfl: 5   metroNIDAZOLE  (FLAGYL ) 500 MG tablet, Take 1 tablet (500 mg total) by mouth 2 (two) times daily. (Patient not taking: Reported on 01/21/2023), Disp: 14 tablet, Rfl: 0   omeprazole  (PRILOSEC) 40 MG capsule, Take 1 capsule (40 mg total) by mouth in the morning and at bedtime., Disp: 60 capsule, Rfl: 5   temazepam  (RESTORIL ) 15 MG capsule, Take 1 capsule (15 mg total) by  mouth at bedtime as needed., Disp: 30 capsule, Rfl: 2  Vitals   Vitals:   06/08/23 1205  BP: (!) 183/80  Pulse: 99  Resp: 18  Temp: 98.3 F (36.8 C)  SpO2: 100%    There is no height or weight on file to calculate BMI.  Physical Exam   Constitutional: Appears well-developed and well-nourished.  Psych: Affect appropriate to situation.  Eyes: No scleral injection.  HENT: No OP obstruction.  Head: Normocephalic.  Cardiovascular: Normal rate and regular rhythm.  Respiratory: Effort normal, non-labored breathing.  GI: Soft.  No distension. There is no tenderness.  Skin: WDI.   Neurologic Examination  She is awake alert oriented x 3 No evidence of dysarthria No evidence of aphasia Cranial nerves: Pupils equal round react light, extraocular movements intact, visual fields full, face appears symmetric, facial sensation diminished on the left, auditory acuity intact, tongue and palate midline. Motor examination with what appeared to be somewhat effort dependent drift on the left upper and lower extremity.  Positive Hoover sign.  Right side full strength Sensation diminished light touch on left hemibody with a sharp cut off in the middle Coordination examination with no dysmetria Gait testing deferred at this time  Labs/Imaging/Neurodiagnostic studies   CBC:  Recent Labs  Lab June 08, 2023 1224  HGB 13.6  HCT 40.0   Basic Metabolic Panel:  Lab Results  Component Value Date   NA 139 2023/06/08   K 3.4 (L) June 08, 2023    CO2 26 09/08/2022   GLUCOSE 115 (H) June 08, 2023   BUN 18 06/08/23   CREATININE 0.90 06/08/2023   CALCIUM  9.6 09/08/2022   GFRNONAA >60 09/08/2022   GFRAA 90 11/21/2017   Lipid Panel:  Lab Results  Component Value Date   LDLCALC 122 (H) 09/21/2017   HgbA1c: No results found for: HGBA1C Urine Drug Screen:     Component Value Date/Time   LABOPIA NONE DETECTED 09/21/2017 0719   COCAINSCRNUR NONE DETECTED 09/21/2017 0719   LABBENZ NONE DETECTED 09/21/2017 0719   AMPHETMU NONE DETECTED 09/21/2017 0719   THCU NONE DETECTED 09/21/2017 0719   LABBARB NONE DETECTED 09/21/2017 0719    Alcohol Level No results found for: [facility] INR  Lab Results  Component Value Date   INR 0.98 09/20/2017   APTT  Lab Results  Component Value Date   APTT 26 09/20/2017   CT Head without contrast(Personally reviewed): Aspects 10  Stat MRI Brain(Personally reviewed): Negative for acute stroke.   ASSESSMENT   Stacey Mendoza is a 58 y.o. female  has a past medical history of Abdominal pain, chronic, right upper quadrant (11/28/2013), Abdominal pain, right lower quadrant (06/29/2013), Anemia, Bulging disc, Chest pain (09/21/2017), Dyspepsia and other specified disorders of function of stomach (04/10/2013), Elevated LFTs, Family history of breast cancer in female (11/28/2013), Hypertension, Hypokalemia (09/21/2017), Midepigastric pain (10/23/2012), Migraine headache (06/13/2013), Nonspecific abnormal results of liver function study (06/29/2013), Normal vaginal delivery (1992, 1986, 1990), TIA (transient ischemic attack) (09/21/2017), and Unspecified constipation (10/23/2012).  With prior TIA with similar symptoms presented for sudden onset of nausea, left-sided headache and left-sided weakness and numbness with some nonorganic findings on exam.  Given prior history of documented TIA although the episode was very similar to this 1, rushed for a stat MRI to ensure that there is no ongoing stroke.  MRI negative for  stroke. Likely presentation consistent with complicated migraine versus anxiety related symptoms.  RECOMMENDATIONS  I would recommend migraine cocktail-Toradol /Reglan /Benadryl  IV If symptoms resolve, no further recommendations. If continues  to have headaches, please reach back out for further recommendations on headache medication. She should follow-up with outpatient neurology for headaches in 8 to 12 weeks. Plan discussed with ED provider Dr. Melvenia ______________________________________________________________________    Signed, Rocky JAYSON Likes, NP [facility]   Attending Neurohospitalist Addendum Patient seen and examined with APP/Resident. Agree with the history and physical as documented above. Agree with the plan as documented, which I helped formulate. I have independently reviewed the chart, obtained history, review of systems and examined the patient.I have personally reviewed pertinent head/neck/spine imaging (CT/MRI). Please feel free to call with any questions.  -- Eligio Lav, MD Neurologist [facility] Pager: [phone]

## 2023-05-31 NOTE — ED Provider Notes (Signed)
 [place] [facility]PARTMENT AT [facility] Provider Note   CSN: [id number] Arrival date & time: 05/31/23  1155     History  Chief Complaint  Patient presents with   Code Stroke    Stacey Mendoza is a 58 y.o. female.  HPI Patient presents for strokelike symptoms.  Medical history includes migraines, TIA, anemia.  This morning, she had a mild headache.  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  While at work, she had worsening of headache.  She developed symptoms of nausea and left-sided weakness.  Patient describes headache as posterior.  She denies any recent trauma.      Home Medications Prior to Admission medications   Medication Sig Start Date End Date Taking? Authorizing Provider  albuterol  (PROAIR  HFA) 108 (90 Base) MCG/ACT inhaler Inhale 2 puffs into the lungs every 6 (six) hours as needed for cough/wheezing 01/06/23   Mannam, Praveen, MD  amLODipine  (NORVASC ) 10 MG tablet Take 1 tablet (10 mg total) by mouth daily. 05/17/23     aspirin  EC 81 MG EC tablet Take 1 tablet (81 mg total) by mouth daily. 09/22/17   Jillian Buttery, MD  atorvastatin  (LIPITOR) 20 MG tablet Take 1 tablet (20 mg total) by mouth every evening at 6 PM. 05/20/22     buPROPion  (WELLBUTRIN  XL) 300 MG 24 hr tablet Take 1 tablet (300 mg total) by mouth in the morning. 03/28/23     cetirizine (ZYRTEC) 10 MG tablet Take 10 mg by mouth daily.    [provider]  Fezolinetant  (VEOZAH ) 45 MG TABS Take 1 tablet (45 mg total) by mouth daily. 01/13/23   Chrzanowski, Jami B, NP  fluconazole  (DIFLUCAN ) 150 MG tablet Take 1 tablet (150 mg total) by mouth every 3 (three) days. Patient not taking: Reported on 01/21/2023 01/13/23   Chrzanowski, Jami B, NP  fluticasone -salmeterol (ADVAIR  DISKUS) 500-50 MCG/ACT AEPB Inhale 1 puff into the lungs in the morning and at bedtime. 01/06/23   Mannam, Praveen, MD  hydrochlorothiazide  (HYDRODIURIL ) 25 MG tablet Take 1 tablet (25 mg total) by mouth in the  morning. 05/17/23     linaclotide  (LINZESS ) 290 MCG CAPS capsule Take 1 capsule (290 mcg total) by mouth daily with first meal 03/01/22     linaclotide  (LINZESS ) 290 MCG CAPS capsule Take 1 capsule (290 mcg total) by mouth daily with breakfast. 03/28/23     metroNIDAZOLE  (FLAGYL ) 500 MG tablet Take 1 tablet (500 mg total) by mouth 2 (two) times daily. Patient not taking: Reported on 01/21/2023 01/13/23   Chrzanowski, Jami B, NP  omeprazole  (PRILOSEC) 40 MG capsule Take 1 capsule (40 mg total) by mouth in the morning and at bedtime. 11/19/22   Mannam, Praveen, MD  temazepam  (RESTORIL ) 15 MG capsule Take 1 capsule (15 mg total) by mouth at bedtime as needed. 03/28/23         Allergies    Hydrocodone -acetaminophen     Review of Systems   Review of Systems  Gastrointestinal:  Positive for nausea.  Neurological:  Positive for weakness and headaches.  All other systems reviewed and are negative.   Physical Exam Updated Vital Signs BP 121/77   Pulse 92   Temp 98 F (36.7 C) (Temporal)   Resp 16   SpO2 100%  Physical Exam Vitals and nursing note reviewed.  Constitutional:      General: She is not in acute distress.    Appearance: Normal appearance. She is well-developed. She is not ill-appearing, toxic-appearing or diaphoretic.  HENT:     Head: Normocephalic and atraumatic.     Right Ear: External ear normal.     Left Ear: External ear normal.     Nose: Nose normal.     Mouth/Throat:     Mouth: Mucous membranes are moist.  Eyes:     Extraocular Movements: Extraocular movements intact.     Conjunctiva/sclera: Conjunctivae normal.  Cardiovascular:     Rate and Rhythm: Normal rate and regular rhythm.  Pulmonary:     Effort: Pulmonary effort is normal. No respiratory distress.  Abdominal:     General: There is no distension.     Palpations: Abdomen is soft.  Musculoskeletal:        General: No swelling or deformity. Normal range of motion.     Cervical back: Normal range of motion  and neck supple.     Right lower leg: No edema.     Left lower leg: No edema.  Skin:    General: Skin is warm and dry.     Capillary Refill: Capillary refill takes less than 2 seconds.     Coloration: Skin is not jaundiced or pale.  Neurological:     General: No focal deficit present.     Mental Status: She is alert and oriented to person, place, and time.     Cranial Nerves: No cranial nerve deficit.     Sensory: No sensory deficit.     Motor: No weakness.     Coordination: Coordination normal.  Psychiatric:        Mood and Affect: Mood normal.        Behavior: Behavior normal.     ED Results / Procedures / Treatments   Labs (all labs ordered are listed, but only abnormal results are displayed) Labs Reviewed  CBC - Abnormal; Notable for the following components:      Result Value   RBC 5.45 (*)    MCV 70.8 (*)    MCH 22.8 (*)    RDW 16.0 (*)    Platelets 145 (*)    All other components within normal limits  COMPREHENSIVE METABOLIC PANEL - Abnormal; Notable for the following components:   Glucose, Bld 109 (*)    All other components within normal limits  I-STAT CHEM 8, ED - Abnormal; Notable for the following components:   Potassium 3.4 (*)    Glucose, Bld 115 (*)    All other components within normal limits  PROTIME-INR  APTT  DIFFERENTIAL  ETHANOL  MAGNESIUM   HCG, SERUM, QUALITATIVE  CBG MONITORING, ED    EKG None  Radiology MR BRAIN WO CONTRAST Result Date: 05/31/2023 CLINICAL DATA:  Neuro deficit, acute, stroke suspected. EXAM: MRI HEAD WITHOUT CONTRAST TECHNIQUE: Multiplanar, multiecho pulse sequences of the brain and surrounding structures were obtained without intravenous contrast. COMPARISON:  Head CT May 31, 2023. MRI of the brain Sep 21, 2017. FINDINGS: Brain: No acute infarction, hemorrhage, hydrocephalus, extra-axial collection or mass lesion. Rare punctate foci of T2 hyperintensity are seen the white matter of the cerebral hemispheres, nonspecific.  Vascular: Normal flow voids. Skull and upper cervical spine: Diffuse decrease of the T1 signal in the calvarium may represent red marrow reconversion. Sinuses/Orbits: Trace mucosal thickening throughout the paranasal sinuses. The orbits are maintained. Other: None. IMPRESSION: 1. No acute intracranial abnormality. 2. Rare punctate foci of T2 hyperintensity in the white matter of the cerebral hemispheres, nonspecific but may related to early chronic microvascular ischemic changes, migraine and post inflammatory/infectious processes. Electronically Signed  By: Katyucia  de Macedo Rodrigues M.D.   On: 05/31/2023 13:15   CT HEAD CODE STROKE WO CONTRAST Result Date: 05/31/2023 CLINICAL DATA:  Code stroke. Neuro deficit, acute, stroke suspected. Left-sided weakness. EXAM: CT HEAD WITHOUT CONTRAST TECHNIQUE: Contiguous axial images were obtained from the base of the skull through the vertex without intravenous contrast. RADIATION DOSE REDUCTION: This exam was performed according to the departmental dose-optimization program which includes automated exposure control, adjustment of the mA and/or kV according to patient size and/or use of iterative reconstruction technique. COMPARISON:  05/08/2021 FINDINGS: Brain: Normal appearance without evidence of old or acute infarction, mass lesion, hemorrhage, hydrocephalus or extra-axial collection. Vascular: No abnormal vascular finding. Skull: Normal Sinuses/Orbits: Clear/normal Other: None ASPECTS (Alberta Stroke Program Early CT Score) - Ganglionic level infarction (caudate, lentiform nuclei, internal capsule, insula, M1-M3 cortex): 7 - Supraganglionic infarction (M4-M6 cortex): 3 Total score (0-10 with 10 being normal): 10 IMPRESSION: 1. Normal head CT. 2. Aspects is 10. These results were communicated to Dr. Arora at 12:26 pm on 05/31/2023 by text page via the [facility] messaging system. Electronically Signed   By: Oneil Officer M.D.   On: 05/31/2023 12:27     Procedures Procedures    Medications Ordered in ED Medications  sodium chloride  flush (NS) 0.9 % injection 3 mL (3 mLs Intravenous Given 05/31/23 1403)  ondansetron  (ZOFRAN ) injection 4 mg (4 mg Intravenous Given 05/31/23 1238)  metoCLOPramide  (REGLAN ) injection 10 mg (10 mg Intravenous Given 05/31/23 1401)  ketorolac  (TORADOL ) 15 MG/ML injection 15 mg (15 mg Intravenous Given 05/31/23 1403)  diphenhydrAMINE  (BENADRYL ) injection 12.5 mg (12.5 mg Intravenous Given 05/31/23 1400)  lactated ringers  bolus 1,000 mL (1,000 mLs Intravenous New Bag/Given 05/31/23 1418)  potassium chloride  SA (KLOR-CON  M) CR tablet 40 mEq (40 mEq Oral Given 05/31/23 1410)    ED Course/ Medical Decision Making/ A&P                                 Medical Decision Making Amount and/or Complexity of Data Reviewed Labs: ordered. Radiology: ordered.  Risk Prescription drug management.   This patient presents to the ED for concern of headache, weakness, this involves an extensive number of treatment options, and is a complaint that carries with it a high risk of complications and morbidity.  The differential diagnosis includes CVA, complex migraine, seizure, ICH, metabolic derangements   Co morbidities that complicate the patient evaluation  migraines, TIA, anemia   Additional history obtained:  Additional history obtained from N/A External records from outside source obtained and reviewed including EMR   Lab Tests:  I Ordered, and personally interpreted labs.  The pertinent results include: Kidney function, normal electrolytes, no leukocytosis   Imaging Studies ordered:  I ordered imaging studies including CT head, MRI brain I independently visualized and interpreted imaging which showed no acute findings I agree with the radiologist interpretation   Cardiac Monitoring: / EKG:  The patient was maintained on a cardiac monitor.  I personally viewed and interpreted the cardiac monitored which  showed an underlying rhythm of: Sinus rhythm   Consultations Obtained:  I requested consultation with the neurologist, Dr. Voncile,  and discussed lab and imaging findings as well as pertinent plan - they recommend: No further neurowork-up, treat for headache and discharge   Problem List / ED Course / Critical interventions / Medication management  Patient presenting for acute onset of strokelike symptoms.  This includes headache,  nausea, and left-sided weakness.  Vital signs on arrival notable for hypertension.  She arrives as a code stroke and was taken directly to CT scanner.  Noncontrasted CT scan did not show acute findings.  Patient was taken for stat MRI.  MRI was negative for acute findings.  On return to the emergency department, she endorses ongoing posterior headache.  Hypertension has improved.  She does continue to have subjective weakness on the left side.  I do not appreciate any significant left-sided weakness on neuroexam.  Headache cocktail was ordered.  Patient had improved symptoms while in the ED.  She was discharged in stable condition. I ordered medication including IV fluid, Toradol , Reglan , Benadryl  for headache; potassium chloride  for hypokalemia Reevaluation of the patient after these medicines showed that the patient improved I have reviewed the patients home medicines and have made adjustments as needed   Social Determinants of Health:  Has PCP        Final Clinical Impression(s) / ED Diagnoses Final diagnoses:  Bad headache    Rx / DC Orders ED Discharge Orders     None         Melvenia Motto, MD 05/31/23 1514

## 2023-05-31 NOTE — ED Triage Notes (Signed)
 Pt here with reports of L sided weakness along with sudden onset nausea and headache onset approx 1145 while at work.

## 2023-05-31 NOTE — Code Documentation (Signed)
 Stroke Response Nurse Documentation Code Documentation  Stacey Mendoza is a 58 y.o. female who presented from work at [employer] to ED on 05/31/2023 with past medical hx of chronic RUQ abdominal pain, anemia, bulging disc, HTN, migraine headache. On aspirin  81 mg daily. Code stroke was activated by ED.   Patient from work where she was LKW at 1135 and now complaining of sudden onset of nausea along with a headache.  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 She then had a sudden onset of left-sided weakness.    Stroke team at the bedside on patient arrival. Labs drawn and patient cleared for CT by Dr. Melvenia. Patient to CT with team. NIHSS 3, see documentation for details and code stroke times. Patient with left arm weakness and left decreased sensation on exam. The following imaging was completed:  CT Head and MRI. Patient is not a candidate for IV Thrombolytic due to no stroke noted on MRI per MD. Patient is not a candidate for IR due to no LVO noted on imaging per MD.   Stacey Mendoza Code Stroke   Bedside handoff with ED RN Metta.    Stacey Mendoza  Stroke Response RN

## 2023-05-31 NOTE — ED Notes (Signed)
 Pt ambulatory to bathroom

## 2023-05-31 NOTE — Discharge Instructions (Signed)
 Test results today are reassuring.  Take ibuprofen and Tylenol as needed for headache.  Return to the emergency department for any new or worsening symptoms of concern.

## 2023-06-01 ENCOUNTER — Other Ambulatory Visit (HOSPITAL_COMMUNITY): Payer: Self-pay

## 2023-06-01 DIAGNOSIS — G4700 Insomnia, unspecified: Secondary | ICD-10-CM | POA: Diagnosis not present

## 2023-06-01 DIAGNOSIS — Z6830 Body mass index (BMI) 30.0-30.9, adult: Secondary | ICD-10-CM | POA: Diagnosis not present

## 2023-06-01 DIAGNOSIS — G43909 Migraine, unspecified, not intractable, without status migrainosus: Secondary | ICD-10-CM | POA: Diagnosis not present

## 2023-06-01 MED ORDER — AMITRIPTYLINE HCL 25 MG PO TABS
25.0000 mg | ORAL_TABLET | Freq: Every day | ORAL | 3 refills | Status: DC
  Filled 2023-06-01: qty 90, 90d supply, fill #0

## 2023-06-17 ENCOUNTER — Other Ambulatory Visit (HOSPITAL_COMMUNITY): Payer: Self-pay

## 2023-06-17 DIAGNOSIS — R52 Pain, unspecified: Secondary | ICD-10-CM | POA: Diagnosis not present

## 2023-06-17 DIAGNOSIS — Z6830 Body mass index (BMI) 30.0-30.9, adult: Secondary | ICD-10-CM | POA: Diagnosis not present

## 2023-06-17 DIAGNOSIS — J111 Influenza due to unidentified influenza virus with other respiratory manifestations: Secondary | ICD-10-CM | POA: Diagnosis not present

## 2023-06-17 DIAGNOSIS — R051 Acute cough: Secondary | ICD-10-CM | POA: Diagnosis not present

## 2023-06-17 MED ORDER — OSELTAMIVIR PHOSPHATE 75 MG PO CAPS
75.0000 mg | ORAL_CAPSULE | Freq: Two times a day (BID) | ORAL | 0 refills | Status: AC
  Filled 2023-06-17: qty 10, 5d supply, fill #0

## 2023-06-17 MED ORDER — PROMETHAZINE-DM 6.25-15 MG/5ML PO SYRP
5.0000 mL | ORAL_SOLUTION | Freq: Four times a day (QID) | ORAL | 0 refills | Status: DC
  Filled 2023-06-17: qty 200, 10d supply, fill #0

## 2023-06-22 ENCOUNTER — Other Ambulatory Visit (HOSPITAL_COMMUNITY): Payer: Self-pay

## 2023-06-22 ENCOUNTER — Other Ambulatory Visit: Payer: Self-pay

## 2023-06-23 ENCOUNTER — Other Ambulatory Visit (HOSPITAL_COMMUNITY): Payer: Self-pay

## 2023-06-23 ENCOUNTER — Encounter (HOSPITAL_COMMUNITY): Payer: Self-pay

## 2023-06-27 ENCOUNTER — Other Ambulatory Visit: Payer: Self-pay | Admitting: Family Medicine

## 2023-06-27 DIAGNOSIS — Z1231 Encounter for screening mammogram for malignant neoplasm of breast: Secondary | ICD-10-CM

## 2023-06-30 ENCOUNTER — Other Ambulatory Visit (HOSPITAL_COMMUNITY): Payer: Self-pay

## 2023-06-30 DIAGNOSIS — R053 Chronic cough: Secondary | ICD-10-CM | POA: Diagnosis not present

## 2023-06-30 MED ORDER — PREDNISONE 10 MG (21) PO TBPK
ORAL_TABLET | ORAL | 0 refills | Status: DC
Start: 2023-06-30 — End: 2023-09-07
  Filled 2023-06-30: qty 21, 6d supply, fill #0

## 2023-06-30 MED ORDER — BENZONATATE 100 MG PO CAPS
100.0000 mg | ORAL_CAPSULE | Freq: Three times a day (TID) | ORAL | 0 refills | Status: DC
Start: 2023-06-30 — End: 2023-09-07
  Filled 2023-06-30: qty 21, 7d supply, fill #0

## 2023-06-30 MED ORDER — AZITHROMYCIN 250 MG PO TABS
ORAL_TABLET | ORAL | 0 refills | Status: DC
  Filled 2023-06-30 (×2): qty 6, 5d supply, fill #0

## 2023-06-30 MED ORDER — PROMETHAZINE-DM 6.25-15 MG/5ML PO SYRP
5.0000 mL | ORAL_SOLUTION | Freq: Four times a day (QID) | ORAL | 0 refills | Status: DC
  Filled 2023-06-30: qty 200, 10d supply, fill #0

## 2023-06-30 MED ORDER — ALBUTEROL SULFATE HFA 108 (90 BASE) MCG/ACT IN AERS
1.0000 | INHALATION_SPRAY | RESPIRATORY_TRACT | 0 refills | Status: DC
Start: 2023-06-30 — End: 2023-08-17
  Filled 2023-06-30: qty 6.7, 30d supply, fill #0

## 2023-07-13 ENCOUNTER — Other Ambulatory Visit (HOSPITAL_COMMUNITY): Payer: Self-pay

## 2023-07-13 DIAGNOSIS — Z6830 Body mass index (BMI) 30.0-30.9, adult: Secondary | ICD-10-CM | POA: Diagnosis not present

## 2023-07-13 DIAGNOSIS — R051 Acute cough: Secondary | ICD-10-CM | POA: Diagnosis not present

## 2023-07-13 DIAGNOSIS — G43909 Migraine, unspecified, not intractable, without status migrainosus: Secondary | ICD-10-CM | POA: Diagnosis not present

## 2023-07-13 MED ORDER — GUAIFENESIN-CODEINE 100-10 MG/5ML PO SOLN
5.0000 mL | ORAL | 0 refills | Status: DC
  Filled 2023-07-13 (×2): qty 150, 5d supply, fill #0

## 2023-07-13 MED ORDER — AMITRIPTYLINE HCL 50 MG PO TABS
50.0000 mg | ORAL_TABLET | Freq: Every day | ORAL | 3 refills | Status: DC
  Filled 2023-07-13: qty 90, 90d supply, fill #0

## 2023-07-14 ENCOUNTER — Other Ambulatory Visit (HOSPITAL_COMMUNITY): Payer: Self-pay

## 2023-08-04 ENCOUNTER — Ambulatory Visit
Admission: RE | Admit: 2023-08-04 | Discharge: 2023-08-04 | Disposition: A | Discharge status: Home / Self Care | Payer: Commercial Managed Care - PPO | Source: Ambulatory Visit | Attending: Family Medicine | Admitting: Family Medicine

## 2023-08-04 DIAGNOSIS — Z1231 Encounter for screening mammogram for malignant neoplasm of breast: Secondary | ICD-10-CM

## 2023-08-12 ENCOUNTER — Other Ambulatory Visit (HOSPITAL_COMMUNITY): Payer: Self-pay

## 2023-08-12 MED ORDER — UBRELVY 50 MG PO TABS
50.0000 mg | ORAL_TABLET | Freq: Every day | ORAL | 0 refills | Status: DC | PRN
  Filled 2023-08-12: qty 10, 10d supply, fill #0
  Filled 2023-08-17: qty 10, 30d supply, fill #0

## 2023-08-17 ENCOUNTER — Other Ambulatory Visit: Payer: Self-pay

## 2023-08-17 ENCOUNTER — Other Ambulatory Visit (HOSPITAL_COMMUNITY): Payer: Self-pay

## 2023-08-17 MED ORDER — ATORVASTATIN CALCIUM 20 MG PO TABS
20.0000 mg | ORAL_TABLET | Freq: Every evening | ORAL | 0 refills | Status: DC
  Filled 2023-08-17: qty 90, 90d supply, fill #0

## 2023-08-17 MED ORDER — ALBUTEROL SULFATE HFA 108 (90 BASE) MCG/ACT IN AERS
1.0000 | INHALATION_SPRAY | RESPIRATORY_TRACT | 0 refills | Status: DC | PRN
Start: 2023-08-17 — End: 2023-09-07
  Filled 2023-08-17: qty 6.7, 25d supply, fill #0

## 2023-08-24 ENCOUNTER — Other Ambulatory Visit (HOSPITAL_COMMUNITY): Payer: Self-pay

## 2023-08-24 DIAGNOSIS — I10 Essential (primary) hypertension: Secondary | ICD-10-CM | POA: Diagnosis not present

## 2023-08-24 DIAGNOSIS — G43909 Migraine, unspecified, not intractable, without status migrainosus: Secondary | ICD-10-CM | POA: Diagnosis not present

## 2023-08-24 DIAGNOSIS — J302 Other seasonal allergic rhinitis: Secondary | ICD-10-CM | POA: Diagnosis not present

## 2023-08-24 MED ORDER — SUMATRIPTAN SUCCINATE 100 MG PO TABS
100.0000 mg | ORAL_TABLET | ORAL | 2 refills | Status: DC
  Filled 2023-08-24: qty 9, 30d supply, fill #0
  Filled 2023-10-18: qty 9, 30d supply, fill #1
  Filled 2024-01-15: qty 9, 30d supply, fill #2
  Filled 2024-03-02: qty 3, 10d supply, fill #3
  Filled 2024-05-31: qty 9, 30d supply, fill #3

## 2023-08-24 MED ORDER — ONDANSETRON HCL 4 MG PO TABS
4.0000 mg | ORAL_TABLET | Freq: Three times a day (TID) | ORAL | 2 refills | Status: AC
  Filled 2023-08-24: qty 30, 10d supply, fill #0
  Filled 2023-10-18: qty 30, 10d supply, fill #1
  Filled 2023-11-14: qty 30, 10d supply, fill #2

## 2023-08-24 MED ORDER — UBRELVY 50 MG PO TABS
50.0000 mg | ORAL_TABLET | ORAL | 2 refills | Status: AC
  Filled 2023-09-19: qty 10, 30d supply, fill #0
  Filled 2023-10-18: qty 10, 30d supply, fill #1
  Filled 2024-01-15: qty 10, 30d supply, fill #2
  Filled 2024-03-02 – 2024-05-31 (×2): qty 10, 30d supply, fill #3

## 2023-08-25 ENCOUNTER — Other Ambulatory Visit (HOSPITAL_COMMUNITY): Payer: Self-pay

## 2023-08-30 NOTE — Progress Notes (Signed)
 Cardiology Office Note:    Date:  09/07/2023   ID:  Stacey Mendoza, DOB Mar 01, 1966, MRN [id number]  PCP:  [facility] Cardiologist:  Janelle Mediate, MD  [facility] Electrophysiologist:  None   Chief Complaint: yearly follow up   History of Present Illness:    Stacey Mendoza is a 58 y.o. female with a hx of HTN, TIA, HLD seen for follow up. I have not seen her since 03/24/20   Family hx of premature CAD >> sister with MI at age 55. Mother with hx of CAD. Another sister with hx of TIA.   Echo 09/2017 showed normal LVEF.  Cardiac CTA 12/2017 showed calcium  score of 0 and normal coronaries.  Cardiac Calcium  score 08/16/22 also 0   Chronic cough. Had with ACE in past Started on albuterol  and Advair  Seen by pulmonary 12/17/21 History of COVID but no fibrosis ? Related to GERD PFT's with minimal restriction related to body habitus on PPI  Likes shoes and pocket books and shopping in [place] Works in housekeeping in pediatric ward [employer] Non smoker   Seen in ED 05/31/23 with headache, nausea and left sided weakness CT and MRI were negative   I take care of her mom [name] she has 3 kids Had a nice trip to [place] with family around Stacey Mendoza  Past Medical History:  Diagnosis Date   Abdominal pain, chronic, right upper quadrant 11/28/2013   Abdominal pain, right lower quadrant 06/29/2013   Anemia    Bulging disc    cervical MRI pending   Chest pain 09/21/2017   Dyspepsia and other specified disorders of function of stomach 04/10/2013   Elevated LFTs    Family history of breast cancer in female 11/28/2013   Hypertension    Hypokalemia 09/21/2017   Midepigastric pain 10/23/2012   Migraine headache 06/13/2013   Nonspecific abnormal results of liver function study 06/29/2013   Normal vaginal delivery 1992, 1986, 1990   TIA (transient ischemic attack) 09/21/2017   Unspecified constipation 10/23/2012    Past Surgical History:  Procedure Laterality Date    CHOLECYSTECTOMY N/A 07/06/2017   Procedure: LAPAROSCOPIC CHOLECYSTECTOMY;  Surgeon: Derral Flick, MD;  Location: [facility];  Service: General;  Laterality: N/A;   ROTATOR CUFF REPAIR Right 11/04/2020   TUBAL LIGATION      Current Medications: Current Meds  Medication Sig   albuterol  (PROAIR  HFA) 108 (90 Base) MCG/ACT inhaler Inhale 2 puffs into the lungs every 6 (six) hours as needed for cough/wheezing   amLODipine  (NORVASC ) 10 MG tablet Take 1 tablet (10 mg total) by mouth daily.   aspirin  EC 81 MG EC tablet Take 1 tablet (81 mg total) by mouth daily.   atorvastatin  (LIPITOR) 20 MG tablet Take 1 tablet (20 mg total) by mouth every evening at 6pm *need office visit*   cetirizine (ZYRTEC) 10 MG tablet Take 10 mg by mouth daily.   Fezolinetant  (VEOZAH ) 45 MG TABS Take 1 tablet (45 mg total) by mouth daily.   fluticasone -salmeterol (ADVAIR  DISKUS) 500-50 MCG/ACT AEPB Inhale 1 puff into the lungs in the morning and at bedtime.   hydrochlorothiazide  (HYDRODIURIL ) 25 MG tablet Take 1 tablet (25 mg total) by mouth in the morning.   linaclotide  (LINZESS ) 290 MCG CAPS capsule Take 1 capsule (290 mcg total) by mouth daily with breakfast.   omeprazole  (PRILOSEC) 40 MG capsule Take 1 capsule (40 mg total) by mouth in the morning and at bedtime.  ondansetron  (ZOFRAN ) 4 MG tablet Take 1 tablet (4 mg total) by mouth every 8 (eight) hours.   oseltamivir  (TAMIFLU ) 75 MG capsule Take 1 capsule (75 mg total) by mouth 2 (two) times daily for 5 days   SUMAtriptan  (IMITREX ) 100 MG tablet Take 1 tablet (100 mg total) by mouth as directed at onset of migraine, may repeat in 2 hours if needed, up to 2 tablets per day.   Ubrogepant  (UBRELVY ) 50 MG TABS Take 1 tablet (50 mg total) by mouth as needed, may repeat 1 dose in 2 hours as needed up to 4 tablets per day.   [DISCONTINUED] albuterol  (VENTOLIN  HFA) 108 (90 Base) MCG/ACT inhaler Inhale 1 puff into the lungs every 4 (four) hours as needed.   [DISCONTINUED]  amitriptyline  (ELAVIL ) 25 MG tablet Take 1 tablet (25 mg total) by mouth at bedtime.   [DISCONTINUED] amitriptyline  (ELAVIL ) 50 MG tablet Take 1 tablet (50 mg total) by mouth at bedtime.   [DISCONTINUED] atorvastatin  (LIPITOR) 20 MG tablet Take 1 tablet (20 mg total) by mouth every evening at 6 PM.   [DISCONTINUED] azithromycin  (ZITHROMAX ) 250 MG tablet Take 2 tablets by mouth for the first day, then 1 tablet daily for 4 days   [DISCONTINUED] benzonatate  (TESSALON ) 100 MG capsule Take 1 capsule (100 mg total) by mouth 3 (three) times daily as needed.   [DISCONTINUED] buPROPion  (WELLBUTRIN  XL) 300 MG 24 hr tablet Take 1 tablet (300 mg total) by mouth in the morning.   [DISCONTINUED] fluconazole  (DIFLUCAN ) 150 MG tablet Take 1 tablet (150 mg total) by mouth every 3 (three) days.   [DISCONTINUED] guaiFENesin -codeine  100-10 MG/5ML syrup Take 5 mLs by mouth every 4 (four) hours as needed   [DISCONTINUED] linaclotide  (LINZESS ) 290 MCG CAPS capsule Take 1 capsule (290 mcg total) by mouth daily with first meal   [DISCONTINUED] metroNIDAZOLE  (FLAGYL ) 500 MG tablet Take 1 tablet (500 mg total) by mouth 2 (two) times daily.   [DISCONTINUED] predniSONE  (STERAPRED UNI-PAK 21 TAB) 10 MG (21) TBPK tablet Take as directed per package instructions   [DISCONTINUED] promethazine -dextromethorphan (PROMETHAZINE -DM) 6.25-15 MG/5ML syrup Take 5 mLs by mouth every 6 (six) hours as needed.   [DISCONTINUED] Ubrogepant  (UBRELVY ) 50 MG TABS Take 1 tablet (50 mg total) by mouth daily as needed. May take second dose at least 2 hours after first dose; up to 4 tablets per day as needed     Allergies:   Hydrocodone -acetaminophen    Social History   Socioeconomic History   Marital status: Single    Spouse name: Not on file   Number of children: 3   Years of education: Not on file   Highest education level: Not on file  Occupational History   Occupation: Engineer, site: [employer]  Tobacco Use   Smoking  status: Never    Passive exposure: Never   Smokeless tobacco: Never  Vaping Use   Vaping status: Never Used  Substance and Sexual Activity   Alcohol use: No   Drug use: No   Sexual activity: Yes    Partners: Male    Birth control/protection: Condom    Comment: Decline insurance questions, menarche 58yo, sexual debut 58yo  Other Topics Concern   Not on file  Social History Narrative   Lives alone   Caffeine use: No coffee, no soda   Herbal tea sometimes- no caffeine   Right handed    Social Drivers of Corporate investment banker Strain: Not on file  Food Insecurity:  Not on file  Transportation Needs: Not on file  Physical Activity: Not on file  Stress: Not on file  Social Connections: Not on file     Family History: The patient's family history includes Breast cancer (age of onset: 44) in her sister; Cancer in her sister; Cancer (age of onset: 63) in an other family member; Diabetes in her mother; Heart disease in her father and mother; Hypertension in her brother, mother, sister, sister, and sister; Throat cancer in her mother. There is no history of Colon cancer.    ROS:   Please see the history of present illness.    All other systems reviewed and are negative.   EKGs/Labs/Other Studies Reviewed:    The following studies were reviewed today:  Echo 09/2017 Study Conclusions   - Left ventricle: The cavity size was normal. Wall thickness was    normal. Systolic function was normal. The estimated ejection    fraction was in the range of 60% to 65%. Wall motion was normal;    there were no regional wall motion abnormalities. Left    ventricular diastolic function parameters were normal.  - Aortic valve: Structurally normal valve. Trileaflet. Cusp    separation was normal. Transvalvular velocity was minimally    increased. There was no stenosis. There was no regurgitation.  - Mitral valve: Mildly thickened leaflets . There was trivial    regurgitation.  - Left atrium:  The atrium was normal in size.  - Tricuspid valve: There was trivial regurgitation.  - Pulmonary arteries: PA peak pressure: 23 mm Hg (S).  - Inferior vena cava: The vessel was normal in size. The    respirophasic diameter changes were in the normal range (= 50%),    consistent with normal central venous pressure.   Impressions:   - LVEF 60-65%, normal wall thickness, normal wall motion, normal    diastolic function, trivial MR, normal LA size, trivial TR, RVSP    23 mmHg, normal IVC.   Coronary CTA 12/2017 IMPRESSION: 1.  Normal right dominant coronary arteries   2.  Calcium  score 0   3.  Normal aortic root 2.7 cm  EKG:  09/07/2023 NSR nonspecific ST changes   Recent Labs: 05/31/2023: ALT 24; BUN 17; Creatinine, Ser 0.83; Hemoglobin 12.4; Magnesium  2.1; Platelets 145; Potassium 3.5; Sodium 138  Recent Lipid Panel    Component Value Date/Time   CHOL 194 09/21/2017 0517   TRIG 64 09/21/2017 0517   HDL 59 09/21/2017 0517   CHOLHDL 3.3 09/21/2017 0517   VLDL 13 09/21/2017 0517   LDLCALC 122 (H) 09/21/2017 0517     Physical Exam:    VS:  BP 134/80   Pulse 89   Ht 5' 4.5" (1.638 m)   Wt 182 lb (82.6 kg) Comment: Pt reported  SpO2 98%   BMI 30.76 kg/m     Wt Readings from Last 3 Encounters:  09/07/23 182 lb (82.6 kg)  01/13/23 179 lb (81.2 kg)  01/06/23 182 lb (82.6 kg)     GEN:  Well nourished, well developed in no acute distress HEENT: Normal NECK: No JVD; No carotid bruits LYMPHATICS: No lymphadenopathy CARDIAC: RRR, no murmurs, rubs, gallops RESPIRATORY:  Clear to auscultation without rales, wheezing or rhonchi  ABDOMEN: Soft, non-tender, non-distended MUSCULOSKELETAL:  No edema; No deformity  SKIN: Warm and dry NEUROLOGIC:  Alert and oriented x 3 PSYCHIATRIC:  Normal affect   ASSESSMENT AND PLAN:    HTN - Well controlled.  Continue current medications and  low sodium Dash type diet.  Home readings better than office  .   2. HLD - Continue statin.   -labs with primary   3. Family hx of early CAD - Reassuring coronary CT in 2019.  Normal right dominant cors with calcium  score 0 No anginal pain. Continue exercise regimen. On ASA and statin . Most recent calcium  score 08/16/22 also 0  4. Cough - F/U Manam avoid ACE - continue Advair  and albuterol  - continue prilosec  - Tessalon  Perles  5. TIA: ? Vs complex migraine CT/MRI negative in ER 05/2023 carotid duplex no PAF in ER. Update TTE with bubble study for PFO Has PRN Ubrogepant  now for migraines   Carotid duplex TTE with bubble study Lipid/Liver   F/U in a year   Signed, Janelle Mediate, MD  09/07/2023 8:25 AM    [facility]

## 2023-08-31 ENCOUNTER — Other Ambulatory Visit (HOSPITAL_COMMUNITY): Payer: Self-pay

## 2023-09-07 ENCOUNTER — Other Ambulatory Visit: Payer: Self-pay

## 2023-09-07 ENCOUNTER — Encounter: Payer: Self-pay | Admitting: Cardiovascular Disease

## 2023-09-07 ENCOUNTER — Ambulatory Visit: Payer: Commercial Managed Care - PPO | Attending: Cardiovascular Disease | Admitting: Cardiovascular Disease

## 2023-09-07 VITALS — BP 134/80 | HR 89 | Ht 64.5 in | Wt 182.0 lb

## 2023-09-07 DIAGNOSIS — I10 Essential (primary) hypertension: Secondary | ICD-10-CM

## 2023-09-07 DIAGNOSIS — Z8249 Family history of ischemic heart disease and other diseases of the circulatory system: Secondary | ICD-10-CM

## 2023-09-07 DIAGNOSIS — G459 Transient cerebral ischemic attack, unspecified: Secondary | ICD-10-CM | POA: Diagnosis not present

## 2023-09-07 DIAGNOSIS — E782 Mixed hyperlipidemia: Secondary | ICD-10-CM

## 2023-09-07 LAB — HEPATIC FUNCTION PANEL
ALT: 33 IU/L — ABNORMAL HIGH (ref 0–32)
AST: 20 IU/L (ref 0–40)
Albumin: 4.4 g/dL (ref 3.8–4.9)
Alkaline Phosphatase: 95 IU/L (ref 44–121)
Bilirubin Total: 0.3 mg/dL (ref 0.0–1.2)
Bilirubin, Direct: 0.11 mg/dL (ref 0.00–0.40)
Total Protein: 6.7 g/dL (ref 6.0–8.5)

## 2023-09-07 LAB — LIPID PANEL
Chol/HDL Ratio: 2.4 ratio (ref 0.0–4.4)
Cholesterol, Total: 186 mg/dL (ref 100–199)
HDL: 78 mg/dL (ref >39)
LDL Chol Calc (NIH): 96 mg/dL (ref 0–99)
Triglycerides: 66 mg/dL (ref 0–149)
VLDL Cholesterol Cal: 12 mg/dL (ref 5–40)

## 2023-09-07 NOTE — Patient Instructions (Addendum)
 Medication Instructions:  Your physician recommends that you continue on your current medications as directed. Please refer to the Current Medication list given to you today.  *If you need a refill on your cardiac medications before your next appointment, please call your pharmacy*  Lab Work: Your physician recommends that you have lab work done at any [facility] for fasting lipid and liver panel. If you have labs (blood work) drawn today and your tests are completely normal, you will receive your results only by: MyChart Message (if you have MyChart) OR A paper copy in the mail If you have any lab test that is abnormal or we need to change your treatment, we will call you to review the results.  Testing/Procedures: Your physician has requested that you have an echocardiogram with bubble study. Echocardiography is a painless test that uses sound waves to create images of your heart. It provides your doctor with information about the size and shape of your heart and how well your heart's chambers and valves are working. This procedure takes approximately one hour. There are no restrictions for this procedure. Please do NOT wear cologne, perfume, aftershave, or lotions (deodorant is allowed). Please arrive 15 minutes prior to your appointment time.  Please note: We ask at that you not bring children with you during ultrasound (echo/ vascular) testing. Due to room size and safety concerns, children are not allowed in the ultrasound rooms during exams. Our front office staff cannot provide observation of children in our lobby area while testing is being conducted. An adult accompanying a patient to their appointment will only be allowed in the ultrasound room at the discretion of the ultrasound technician under special circumstances. We apologize for any inconvenience. Your physician has requested that you have a carotid duplex. This test is an ultrasound of the carotid arteries in your neck. It looks at  blood flow through these arteries that supply the brain with blood. Allow one hour for this exam. There are no restrictions or special instructions.  Follow-Up: At [facility], you and your health needs are our priority.  As part of our continuing mission to provide you with exceptional heart care, our providers are all part of one team.  This team includes your primary Cardiologist (physician) and Advanced Practice Providers or APPs (Physician Assistants and Nurse Practitioners) who all work together to provide you with the care you need, when you need it.  Your next appointment:   1 year(s)  Provider:   Janelle Mediate, MD     We recommend signing up for the patient portal called "MyChart".  Sign up information is provided on this After Visit Summary.  MyChart is used to connect with patients for Virtual Visits (Telemedicine).  Patients are able to view lab/test results, encounter notes, upcoming appointments, etc.  Non-urgent messages can be sent to your provider as well.   To learn more about what you can do with MyChart, go to [email or web address].   Other Instructions You may also go to any of these [facility] locations:   [facility] - [address] ([facility]) - [address] [phone] [address]. [address]   [place] - [address]    [place]  - [address]    [place] - [address] - [address] [employer]  - [address] - [address] ([facility]  1st Floor: - Lobby - Registration  - Pharmacy  - Lab - Cafe  2nd Floor: - PV Lab - Diagnostic Testing (echo, CT, nuclear med)  3rd Floor: - Vacant  4th Floor: - TCTS (cardiothoracic surgery) - AFib Clinic - Structural Heart Clinic - Vascular Surgery  - Vascular Ultrasound  5th Floor: - [facility] (general and EP) - Clinical Pharmacy for coumadin, hypertension,  lipid, weight-loss medications, and med management appointments    Valet parking services will be available as well.

## 2023-09-13 ENCOUNTER — Other Ambulatory Visit: Payer: Self-pay

## 2023-09-19 ENCOUNTER — Other Ambulatory Visit: Payer: Self-pay

## 2023-09-19 ENCOUNTER — Other Ambulatory Visit (HOSPITAL_COMMUNITY): Payer: Self-pay

## 2023-10-04 ENCOUNTER — Ambulatory Visit (HOSPITAL_BASED_OUTPATIENT_CLINIC_OR_DEPARTMENT_OTHER)
Admission: RE | Admit: 2023-10-04 | Discharge: 2023-10-04 | Disposition: A | Discharge status: Home / Self Care | Source: Ambulatory Visit | Attending: Cardiovascular Disease | Admitting: Cardiovascular Disease

## 2023-10-04 ENCOUNTER — Ambulatory Visit: Payer: Self-pay | Admitting: Cardiovascular Disease

## 2023-10-04 ENCOUNTER — Ambulatory Visit (HOSPITAL_COMMUNITY)
Admission: RE | Admit: 2023-10-04 | Discharge: 2023-10-04 | Disposition: A | Discharge status: Home / Self Care | Source: Ambulatory Visit | Attending: Cardiovascular Disease | Admitting: Cardiovascular Disease

## 2023-10-04 DIAGNOSIS — G459 Transient cerebral ischemic attack, unspecified: Secondary | ICD-10-CM | POA: Insufficient documentation

## 2023-10-04 DIAGNOSIS — I10 Essential (primary) hypertension: Secondary | ICD-10-CM | POA: Insufficient documentation

## 2023-10-04 DIAGNOSIS — E782 Mixed hyperlipidemia: Secondary | ICD-10-CM | POA: Insufficient documentation

## 2023-10-04 DIAGNOSIS — Z8249 Family history of ischemic heart disease and other diseases of the circulatory system: Secondary | ICD-10-CM | POA: Diagnosis not present

## 2023-10-04 LAB — ECHOCARDIOGRAM COMPLETE BUBBLE STUDY
Area-P 1/2: 5.27 cm2
S' Lateral: 2.9 cm

## 2023-10-17 ENCOUNTER — Other Ambulatory Visit (HOSPITAL_COMMUNITY): Payer: Self-pay

## 2023-10-17 DIAGNOSIS — R7303 Prediabetes: Secondary | ICD-10-CM | POA: Diagnosis not present

## 2023-10-17 DIAGNOSIS — E559 Vitamin D deficiency, unspecified: Secondary | ICD-10-CM | POA: Diagnosis not present

## 2023-10-17 DIAGNOSIS — Z23 Encounter for immunization: Secondary | ICD-10-CM | POA: Diagnosis not present

## 2023-10-17 DIAGNOSIS — E7800 Pure hypercholesterolemia, unspecified: Secondary | ICD-10-CM | POA: Diagnosis not present

## 2023-10-17 DIAGNOSIS — Z6834 Body mass index (BMI) 34.0-34.9, adult: Secondary | ICD-10-CM | POA: Diagnosis not present

## 2023-10-17 DIAGNOSIS — Z0000 Encounter for general adult medical examination without abnormal findings: Secondary | ICD-10-CM | POA: Diagnosis not present

## 2023-10-17 DIAGNOSIS — I10 Essential (primary) hypertension: Secondary | ICD-10-CM | POA: Diagnosis not present

## 2023-10-17 DIAGNOSIS — R0681 Apnea, not elsewhere classified: Secondary | ICD-10-CM | POA: Diagnosis not present

## 2023-10-17 DIAGNOSIS — Z1389 Encounter for screening for other disorder: Secondary | ICD-10-CM | POA: Diagnosis not present

## 2023-10-17 DIAGNOSIS — R0683 Snoring: Secondary | ICD-10-CM | POA: Diagnosis not present

## 2023-10-17 MED ORDER — BUPROPION HCL ER (XL) 300 MG PO TB24
300.0000 mg | ORAL_TABLET | Freq: Every day | ORAL | 3 refills | Status: AC
  Filled 2023-10-17: qty 90, 90d supply, fill #0
  Filled 2024-01-15: qty 90, 90d supply, fill #1
  Filled 2024-05-31: qty 90, 90d supply, fill #2

## 2023-10-19 ENCOUNTER — Encounter (HOSPITAL_COMMUNITY)

## 2023-10-19 ENCOUNTER — Other Ambulatory Visit (HOSPITAL_COMMUNITY)

## 2023-10-28 DIAGNOSIS — I10 Essential (primary) hypertension: Secondary | ICD-10-CM | POA: Diagnosis not present

## 2023-10-28 DIAGNOSIS — R0683 Snoring: Secondary | ICD-10-CM | POA: Diagnosis not present

## 2023-10-28 DIAGNOSIS — J309 Allergic rhinitis, unspecified: Secondary | ICD-10-CM | POA: Diagnosis not present

## 2023-10-28 DIAGNOSIS — E6609 Other obesity due to excess calories: Secondary | ICD-10-CM | POA: Diagnosis not present

## 2023-10-28 DIAGNOSIS — G4700 Insomnia, unspecified: Secondary | ICD-10-CM | POA: Diagnosis not present

## 2023-10-28 DIAGNOSIS — R7303 Prediabetes: Secondary | ICD-10-CM | POA: Diagnosis not present

## 2023-10-28 DIAGNOSIS — G478 Other sleep disorders: Secondary | ICD-10-CM | POA: Diagnosis not present

## 2023-10-28 DIAGNOSIS — G43909 Migraine, unspecified, not intractable, without status migrainosus: Secondary | ICD-10-CM | POA: Diagnosis not present

## 2023-11-14 ENCOUNTER — Other Ambulatory Visit (HOSPITAL_COMMUNITY): Payer: Self-pay

## 2023-11-14 ENCOUNTER — Other Ambulatory Visit: Payer: Self-pay

## 2023-11-14 MED ORDER — HYDROCHLOROTHIAZIDE 25 MG PO TABS
25.0000 mg | ORAL_TABLET | Freq: Every morning | ORAL | 0 refills | Status: DC
  Filled 2023-11-14: qty 90, 90d supply, fill #0

## 2023-11-14 MED ORDER — ATORVASTATIN CALCIUM 20 MG PO TABS
20.0000 mg | ORAL_TABLET | Freq: Every evening | ORAL | 3 refills | Status: AC
  Filled 2023-11-14: qty 90, 90d supply, fill #0
  Filled 2024-03-02: qty 90, 90d supply, fill #1

## 2023-11-14 MED ORDER — AMLODIPINE BESYLATE 10 MG PO TABS
10.0000 mg | ORAL_TABLET | Freq: Every day | ORAL | 0 refills | Status: DC
  Filled 2023-11-14: qty 90, 90d supply, fill #0

## 2023-11-16 ENCOUNTER — Other Ambulatory Visit (HOSPITAL_COMMUNITY): Payer: Self-pay

## 2023-11-16 ENCOUNTER — Other Ambulatory Visit: Payer: Self-pay

## 2023-11-16 ENCOUNTER — Other Ambulatory Visit: Payer: Self-pay | Admitting: Pulmonary Disease

## 2023-11-16 MED ORDER — OMEPRAZOLE 40 MG PO CPDR
40.0000 mg | DELAYED_RELEASE_CAPSULE | Freq: Two times a day (BID) | ORAL | 1 refills | Status: DC
  Filled 2023-11-16: qty 60, 30d supply, fill #0
  Filled 2024-01-15: qty 60, 30d supply, fill #1

## 2023-11-16 NOTE — Telephone Encounter (Signed)
 Patient will need appointment for further refills.

## 2023-11-29 DIAGNOSIS — G4733 Obstructive sleep apnea (adult) (pediatric): Secondary | ICD-10-CM | POA: Diagnosis not present

## 2023-12-05 DIAGNOSIS — E6609 Other obesity due to excess calories: Secondary | ICD-10-CM | POA: Diagnosis not present

## 2023-12-05 DIAGNOSIS — R7303 Prediabetes: Secondary | ICD-10-CM | POA: Diagnosis not present

## 2023-12-05 DIAGNOSIS — G4733 Obstructive sleep apnea (adult) (pediatric): Secondary | ICD-10-CM | POA: Diagnosis not present

## 2023-12-05 DIAGNOSIS — K2100 Gastro-esophageal reflux disease with esophagitis, without bleeding: Secondary | ICD-10-CM | POA: Diagnosis not present

## 2023-12-05 DIAGNOSIS — I10 Essential (primary) hypertension: Secondary | ICD-10-CM | POA: Diagnosis not present

## 2023-12-19 DIAGNOSIS — I10 Essential (primary) hypertension: Secondary | ICD-10-CM | POA: Diagnosis not present

## 2024-01-02 DIAGNOSIS — Z6829 Body mass index (BMI) 29.0-29.9, adult: Secondary | ICD-10-CM | POA: Diagnosis not present

## 2024-01-02 DIAGNOSIS — Z8639 Personal history of other endocrine, nutritional and metabolic disease: Secondary | ICD-10-CM | POA: Diagnosis not present

## 2024-01-02 DIAGNOSIS — G4700 Insomnia, unspecified: Secondary | ICD-10-CM | POA: Diagnosis not present

## 2024-01-02 DIAGNOSIS — I10 Essential (primary) hypertension: Secondary | ICD-10-CM | POA: Diagnosis not present

## 2024-01-02 DIAGNOSIS — Z8673 Personal history of transient ischemic attack (TIA), and cerebral infarction without residual deficits: Secondary | ICD-10-CM | POA: Diagnosis not present

## 2024-01-02 DIAGNOSIS — E663 Overweight: Secondary | ICD-10-CM | POA: Diagnosis not present

## 2024-01-02 DIAGNOSIS — E7800 Pure hypercholesterolemia, unspecified: Secondary | ICD-10-CM | POA: Diagnosis not present

## 2024-01-02 DIAGNOSIS — R7303 Prediabetes: Secondary | ICD-10-CM | POA: Diagnosis not present

## 2024-01-02 DIAGNOSIS — G4733 Obstructive sleep apnea (adult) (pediatric): Secondary | ICD-10-CM | POA: Diagnosis not present

## 2024-01-15 ENCOUNTER — Other Ambulatory Visit: Payer: Self-pay

## 2024-01-15 ENCOUNTER — Other Ambulatory Visit: Payer: Self-pay | Admitting: Pulmonary Disease

## 2024-01-15 ENCOUNTER — Other Ambulatory Visit (HOSPITAL_COMMUNITY): Payer: Self-pay

## 2024-01-15 MED ORDER — FLUTICASONE-SALMETEROL 500-50 MCG/ACT IN AEPB
1.0000 | INHALATION_SPRAY | Freq: Two times a day (BID) | RESPIRATORY_TRACT | 0 refills | Status: DC
  Filled 2024-01-15: qty 60, 30d supply, fill #0

## 2024-01-16 ENCOUNTER — Other Ambulatory Visit (HOSPITAL_COMMUNITY): Payer: Self-pay

## 2024-01-17 ENCOUNTER — Other Ambulatory Visit (HOSPITAL_COMMUNITY): Payer: Self-pay

## 2024-01-17 DIAGNOSIS — I10 Essential (primary) hypertension: Secondary | ICD-10-CM | POA: Diagnosis not present

## 2024-01-17 DIAGNOSIS — Z6829 Body mass index (BMI) 29.0-29.9, adult: Secondary | ICD-10-CM | POA: Diagnosis not present

## 2024-01-17 DIAGNOSIS — E663 Overweight: Secondary | ICD-10-CM | POA: Diagnosis not present

## 2024-01-17 DIAGNOSIS — Z8673 Personal history of transient ischemic attack (TIA), and cerebral infarction without residual deficits: Secondary | ICD-10-CM | POA: Diagnosis not present

## 2024-01-17 DIAGNOSIS — R7303 Prediabetes: Secondary | ICD-10-CM | POA: Diagnosis not present

## 2024-01-17 DIAGNOSIS — E7800 Pure hypercholesterolemia, unspecified: Secondary | ICD-10-CM | POA: Diagnosis not present

## 2024-01-17 DIAGNOSIS — Z8639 Personal history of other endocrine, nutritional and metabolic disease: Secondary | ICD-10-CM | POA: Diagnosis not present

## 2024-01-17 DIAGNOSIS — G4700 Insomnia, unspecified: Secondary | ICD-10-CM | POA: Diagnosis not present

## 2024-01-17 MED ORDER — PHENTERMINE HCL 37.5 MG PO TABS
18.2500 mg | ORAL_TABLET | Freq: Every morning | ORAL | 0 refills | Status: AC
  Filled 2024-01-17: qty 30, 30d supply, fill #0

## 2024-01-17 MED ORDER — AMOXICILLIN 500 MG PO CAPS
500.0000 mg | ORAL_CAPSULE | Freq: Three times a day (TID) | ORAL | 0 refills | Status: AC
  Filled 2024-01-17: qty 24, 8d supply, fill #0

## 2024-02-08 ENCOUNTER — Other Ambulatory Visit (HOSPITAL_COMMUNITY): Payer: Self-pay

## 2024-02-08 DIAGNOSIS — M7062 Trochanteric bursitis, left hip: Secondary | ICD-10-CM | POA: Diagnosis not present

## 2024-02-08 DIAGNOSIS — M722 Plantar fascial fibromatosis: Secondary | ICD-10-CM | POA: Diagnosis not present

## 2024-02-08 DIAGNOSIS — L299 Pruritus, unspecified: Secondary | ICD-10-CM | POA: Diagnosis not present

## 2024-02-08 MED ORDER — PREDNISONE 10 MG (21) PO TBPK
ORAL_TABLET | ORAL | 0 refills | Status: AC
  Filled 2024-02-08: qty 21, 6d supply, fill #0

## 2024-02-08 MED ORDER — TRIAMCINOLONE ACETONIDE 0.1 % EX CREA
1.0000 | TOPICAL_CREAM | Freq: Two times a day (BID) | CUTANEOUS | 2 refills | Status: AC | PRN
  Filled 2024-02-08: qty 45, 23d supply, fill #0

## 2024-02-23 ENCOUNTER — Other Ambulatory Visit (HOSPITAL_COMMUNITY): Payer: Self-pay

## 2024-03-02 ENCOUNTER — Other Ambulatory Visit: Payer: Self-pay

## 2024-03-02 ENCOUNTER — Other Ambulatory Visit (HOSPITAL_COMMUNITY): Payer: Self-pay

## 2024-03-02 ENCOUNTER — Other Ambulatory Visit: Payer: Self-pay | Admitting: Pulmonary Disease

## 2024-03-02 ENCOUNTER — Encounter (HOSPITAL_COMMUNITY): Payer: Self-pay

## 2024-03-05 ENCOUNTER — Other Ambulatory Visit (HOSPITAL_COMMUNITY): Payer: Self-pay

## 2024-03-05 MED ORDER — HYDROCHLOROTHIAZIDE 25 MG PO TABS
25.0000 mg | ORAL_TABLET | Freq: Every day | ORAL | 0 refills | Status: DC
  Filled 2024-03-05: qty 90, 90d supply, fill #0

## 2024-03-05 MED ORDER — AMLODIPINE BESYLATE 10 MG PO TABS
10.0000 mg | ORAL_TABLET | Freq: Every day | ORAL | 1 refills | Status: AC
  Filled 2024-03-05: qty 90, 90d supply, fill #0

## 2024-03-07 DIAGNOSIS — G43909 Migraine, unspecified, not intractable, without status migrainosus: Secondary | ICD-10-CM | POA: Diagnosis not present

## 2024-03-07 DIAGNOSIS — R7303 Prediabetes: Secondary | ICD-10-CM | POA: Diagnosis not present

## 2024-03-07 DIAGNOSIS — Z6829 Body mass index (BMI) 29.0-29.9, adult: Secondary | ICD-10-CM | POA: Diagnosis not present

## 2024-03-07 DIAGNOSIS — E7800 Pure hypercholesterolemia, unspecified: Secondary | ICD-10-CM | POA: Diagnosis not present

## 2024-03-07 DIAGNOSIS — Z8639 Personal history of other endocrine, nutritional and metabolic disease: Secondary | ICD-10-CM | POA: Diagnosis not present

## 2024-03-07 DIAGNOSIS — E663 Overweight: Secondary | ICD-10-CM | POA: Diagnosis not present

## 2024-03-07 DIAGNOSIS — Z8673 Personal history of transient ischemic attack (TIA), and cerebral infarction without residual deficits: Secondary | ICD-10-CM | POA: Diagnosis not present

## 2024-03-07 DIAGNOSIS — G4700 Insomnia, unspecified: Secondary | ICD-10-CM | POA: Diagnosis not present

## 2024-03-08 ENCOUNTER — Other Ambulatory Visit (HOSPITAL_COMMUNITY): Payer: Self-pay

## 2024-03-08 MED ORDER — TOPIRAMATE ER 25 MG PO CAP24
25.0000 mg | ORAL_CAPSULE | Freq: Every day | ORAL | 1 refills | Status: AC
  Filled 2024-03-08: qty 30, 30d supply, fill #0
  Filled 2024-05-31: qty 30, 30d supply, fill #1

## 2024-04-18 ENCOUNTER — Other Ambulatory Visit (HOSPITAL_COMMUNITY): Payer: Self-pay

## 2024-04-18 DIAGNOSIS — M722 Plantar fascial fibromatosis: Secondary | ICD-10-CM | POA: Diagnosis not present

## 2024-04-18 DIAGNOSIS — I10 Essential (primary) hypertension: Secondary | ICD-10-CM | POA: Diagnosis not present

## 2024-04-18 DIAGNOSIS — M7062 Trochanteric bursitis, left hip: Secondary | ICD-10-CM | POA: Diagnosis not present

## 2024-04-18 DIAGNOSIS — K5901 Slow transit constipation: Secondary | ICD-10-CM | POA: Diagnosis not present

## 2024-04-18 DIAGNOSIS — G43909 Migraine, unspecified, not intractable, without status migrainosus: Secondary | ICD-10-CM | POA: Diagnosis not present

## 2024-04-18 DIAGNOSIS — Z23 Encounter for immunization: Secondary | ICD-10-CM | POA: Diagnosis not present

## 2024-04-18 MED ORDER — LINZESS 290 MCG PO CAPS
290.0000 ug | ORAL_CAPSULE | Freq: Every morning | ORAL | 3 refills | Status: AC
  Filled 2024-04-18: qty 90, 90d supply, fill #0

## 2024-04-20 DIAGNOSIS — H524 Presbyopia: Secondary | ICD-10-CM | POA: Diagnosis not present

## 2024-04-23 DIAGNOSIS — M7062 Trochanteric bursitis, left hip: Secondary | ICD-10-CM | POA: Diagnosis not present

## 2024-04-23 DIAGNOSIS — M722 Plantar fascial fibromatosis: Secondary | ICD-10-CM | POA: Diagnosis not present

## 2024-04-23 DIAGNOSIS — M2021 Hallux rigidus, right foot: Secondary | ICD-10-CM | POA: Diagnosis not present

## 2024-04-23 DIAGNOSIS — M2022 Hallux rigidus, left foot: Secondary | ICD-10-CM | POA: Diagnosis not present

## 2024-04-27 ENCOUNTER — Other Ambulatory Visit: Payer: Self-pay

## 2024-04-27 ENCOUNTER — Telehealth: Payer: Self-pay | Admitting: Cardiothoracic Surgery

## 2024-04-27 ENCOUNTER — Encounter (HOSPITAL_BASED_OUTPATIENT_CLINIC_OR_DEPARTMENT_OTHER): Payer: Self-pay | Admitting: Emergency Medicine

## 2024-04-27 ENCOUNTER — Emergency Department (HOSPITAL_BASED_OUTPATIENT_CLINIC_OR_DEPARTMENT_OTHER)

## 2024-04-27 ENCOUNTER — Emergency Department (HOSPITAL_BASED_OUTPATIENT_CLINIC_OR_DEPARTMENT_OTHER)
Admission: EM | Admit: 2024-04-27 | Discharge: 2024-04-28 | Disposition: A | Attending: Emergency Medicine | Admitting: Emergency Medicine

## 2024-04-27 DIAGNOSIS — R0789 Other chest pain: Secondary | ICD-10-CM | POA: Diagnosis not present

## 2024-04-27 DIAGNOSIS — R079 Chest pain, unspecified: Secondary | ICD-10-CM | POA: Diagnosis not present

## 2024-04-27 DIAGNOSIS — Z7982 Long term (current) use of aspirin: Secondary | ICD-10-CM | POA: Diagnosis not present

## 2024-04-27 DIAGNOSIS — I10 Essential (primary) hypertension: Secondary | ICD-10-CM | POA: Insufficient documentation

## 2024-04-27 LAB — CBC
HCT: 36.1 % (ref 36.0–46.0)
Hemoglobin: 11.5 g/dL — ABNORMAL LOW (ref 12.0–15.0)
MCH: 22.4 pg — ABNORMAL LOW (ref 26.0–34.0)
MCHC: 31.9 g/dL (ref 30.0–36.0)
MCV: 70.4 fL — ABNORMAL LOW (ref 80.0–100.0)
Platelets: 170 K/uL (ref 150–400)
RBC: 5.13 MIL/uL — ABNORMAL HIGH (ref 3.87–5.11)
RDW: 16.1 % — ABNORMAL HIGH (ref 11.5–15.5)
WBC: 6.3 K/uL (ref 4.0–10.5)
nRBC: 0 % (ref 0.0–0.2)

## 2024-04-27 LAB — BASIC METABOLIC PANEL WITH GFR
Anion gap: 11 (ref 5–15)
BUN: 19 mg/dL (ref 6–20)
CO2: 27 mmol/L (ref 22–32)
Calcium: 10 mg/dL (ref 8.9–10.3)
Chloride: 103 mmol/L (ref 98–111)
Creatinine, Ser: 0.85 mg/dL (ref 0.44–1.00)
GFR, Estimated: >60 mL/min (ref >60)
Glucose, Bld: 108 mg/dL — ABNORMAL HIGH (ref 70–99)
Potassium: 3.3 mmol/L — ABNORMAL LOW (ref 3.5–5.1)
Sodium: 141 mmol/L (ref 135–145)

## 2024-04-27 LAB — TROPONIN T, HIGH SENSITIVITY: Troponin T High Sensitivity: <15 ng/L (ref 0–19)

## 2024-04-27 NOTE — Telephone Encounter (Signed)
 Called complaining of a band link chest pain in her L chest with some radiation to her shoulder, achiness in her back.  Associated with nausea.  No SOB. Takes ASA every morning Has not checked her blood pressure. Does not have any nitroglycerin  at home.  History of HTN, HL and prior TIA as well as family history of premature CAD.  Her coronary CTA in 2019 had calcium  score of 0 without overt coronary obstruction.  Advised her to proceed to ED for evaluation. She asked if urgent care would suffice, and I said it is highly likely urgent care would see her and then still refer to the ED. She expressed understanding.  [employer] on call

## 2024-04-27 NOTE — ED Triage Notes (Signed)
 Pt presents with left sided chest pain that radiates to shoulder and is accompanied by nausea.  This began Tues.  Has been intermittent but now  is more constant. Called her cardiologist tonight who advised ED eval.

## 2024-04-28 LAB — TROPONIN T, HIGH SENSITIVITY: Troponin T High Sensitivity: <15 ng/L (ref 0–19)

## 2024-04-28 NOTE — ED Provider Notes (Signed)
 [place] EM[facility]GENC[facility] DEPARTMENT AT [facility]  Provider Note  CSN: [id number] Arrival date & time: 04/27/24 2152  History Chief Complaint  Patient presents with   Chest Pain    Stacey Mendoza is a 58 y.o. female with history of HTN, HLD and family history of early MI followed by cardiology has never had any known CAD and prior calcium  scores have been 0. She reports 3 days of intermittent chest tightness, no SOB. Some aching in L shoulder. She called Cardiology this evening and advised to come to the ED for evaluation.   Home Medications Prior to Admission medications  Medication Sig Start Date End Date Taking? Authorizing Provider  albuterol  (PROAIR  HFA) 108 (90 Base) MCG/ACT inhaler Inhale 2 puffs into the lungs every 6 (six) hours as needed for cough/wheezing 01/06/23   Mannam, Praveen, MD  amLODipine  (NORVASC ) 10 MG tablet Take 1 tablet (10 mg total) by mouth daily. 03/05/24     aspirin  EC 81 MG EC tablet Take 1 tablet (81 mg total) by mouth daily. 09/22/17   Jillian Buttery, MD  atorvastatin  (LIPITOR) 20 MG tablet Take 1 tablet (20 mg total) by mouth every evening at 6pm. 11/14/23     buPROPion  (WELLBUTRIN  XL) 300 MG 24 hr tablet Take 1 tablet (300 mg total) by mouth daily. 10/17/23     cetirizine (ZYRTEC) 10 MG tablet Take 10 mg by mouth daily.    [provider]  Fezolinetant  (VEOZAH ) 45 MG TABS Take 1 tablet (45 mg total) by mouth daily. 01/13/23   Chrzanowski, Jami B, NP  fluticasone -salmeterol (ADVAIR  DISKUS) 500-50 MCG/ACT AEPB Inhale 1 puff into the lungs in the morning and at bedtime. 01/15/24   Mannam, Praveen, MD  hydrochlorothiazide  (HYDRODIURIL ) 25 MG tablet Take 1 tablet (25 mg total) by mouth daily. 03/05/24     linaclotide  (LINZESS ) 290 MCG CAPS capsule Take 1 capsule (290 mcg total) by mouth daily with breakfast. 03/28/23     linaclotide  (LINZESS ) 290 MCG CAPS capsule Take 1 capsule (290 mcg total) by mouth in the morning on an empty stomach at least  30 minutes before breakfast. 04/18/24     omeprazole  (PRILOSEC) 40 MG capsule Take 1 capsule (40 mg total) by mouth in the morning and at bedtime. 11/16/23   Mannam, Praveen, MD  ondansetron  (ZOFRAN ) 4 MG tablet Take 1 tablet (4 mg total) by mouth every 8 (eight) hours. 08/24/23     oseltamivir  (TAMIFLU ) 75 MG capsule Take 1 capsule (75 mg total) by mouth 2 (two) times daily for 5 days 06/17/23     phentermine  (ADIPEX-P ) 37.5 MG tablet Take 1/2-1 tablets (18.75-37.5 mg total) by mouth every morning before breakfast. 01/17/24     SUMAtriptan  (IMITREX ) 100 MG tablet Take 1 tablet (100 mg total) by mouth as directed at onset of migraine, may repeat in 2 hours if needed, up to 2 tablets per day. 08/24/23     Topiramate  ER (TROKENDI  XR) 25 MG CP24 Take 1 capsule (25 mg total) by mouth daily. 03/07/24     triamcinolone  cream (KENALOG ) 0.1 % Apply 1 Application topically to affected area 2 (two) times daily as needed for itching. 02/08/24     Ubrogepant  (UBRELVY ) 50 MG TABS Take 1 tablet (50 mg total) by mouth as needed, may repeat 1 dose in 2 hours as needed up to 4 tablets per day. 08/24/23     temazepam  (RESTORIL ) 15 MG capsule Take 1 capsule (15 mg total) by mouth at bedtime  as needed. 03/28/23 06/01/23       Allergies    Hydrocodone -acetaminophen    Review of Systems   Review of Systems Please see HPI for pertinent positives and negatives  Physical Exam BP (!) 177/91 (BP Location: Right Arm)   Pulse (!) 105   Temp 98.4 F (36.9 C) (Oral)   Resp 16   SpO2 99%   Physical Exam Vitals and nursing note reviewed.  Constitutional:      Appearance: Normal appearance.  HENT:     Head: Normocephalic and atraumatic.     Nose: Nose normal.     Mouth/Throat:     Mouth: Mucous membranes are moist.  Eyes:     Extraocular Movements: Extraocular movements intact.     Conjunctiva/sclera: Conjunctivae normal.  Cardiovascular:     Rate and Rhythm: Normal rate.  Pulmonary:     Effort: Pulmonary effort is  normal.     Breath sounds: Normal breath sounds.  Abdominal:     General: Abdomen is flat.     Palpations: Abdomen is soft.     Tenderness: There is no abdominal tenderness.  Musculoskeletal:        General: No swelling. Normal range of motion.     Cervical back: Neck supple.     Right lower leg: No edema.     Left lower leg: No edema.  Skin:    General: Skin is warm and dry.  Neurological:     General: No focal deficit present.     Mental Status: She is alert.  Psychiatric:        Mood and Affect: Mood normal.     ED Results / Procedures / Treatments   EKG EKG Interpretation Date/Time:  Friday April 27 2024 21:57:59 EST Ventricular Rate:  101 PR Interval:  144 QRS Duration:  87 QT Interval:  323 QTC Calculation: 419 R Axis:   50  Text Interpretation: Sinus tachycardia Borderline repolarization abnormality No significant change since last tracing Confirmed by Roselyn Dunnings [phone]) on 04/27/2024 10:59:32 PM  Procedures Procedures  Medications Ordered in the ED Medications - No data to display  Initial Impression and Plan  Patient here with nonspecific chest pain, does not sound ischemic in nature but she does have risk factors. Exam and vitals are reassuring. Labs done in triage show unremarkable CBC, BMP and Trop x 1. I personally viewed the images from radiology studies and agree with radiologist interpretation: CXR is clear. Will check delta trop and dispo accordingly.   ED Course   Clinical Course as of 04/28/24 0049  Sat Apr 28, 2024  0046 Delta trop remains normal. HEART score is 3, low risk for MACE. Recommend outpatient Cardiology follow up if not improving. RTED for any other concerns.  [CS]    Clinical Course User Index [CS] Roselyn Dunnings NOVAK, MD     MDM Rules/Calculators/A&P Medical Decision Making Given presenting complaint, I considered that admission might be necessary. After review of results from ED lab and/or imaging studies, admission to  the hospital is not indicated at this time.    Problems Addressed: Nonspecific chest pain: acute illness or injury  Amount and/or Complexity of Data Reviewed Labs: ordered. Decision-making details documented in ED Course. Radiology: ordered and independent interpretation performed. Decision-making details documented in ED Course. ECG/medicine tests: ordered and independent interpretation performed. Decision-making details documented in ED Course.  Risk Decision regarding hospitalization.     Final Clinical Impression(s) / ED Diagnoses Final diagnoses:  Nonspecific chest pain  Rx / DC Orders ED Discharge Orders     None        Roselyn Carlin NOVAK, MD 04/28/24 [phone]

## 2024-05-02 DIAGNOSIS — E7800 Pure hypercholesterolemia, unspecified: Secondary | ICD-10-CM | POA: Diagnosis not present

## 2024-05-02 DIAGNOSIS — I10 Essential (primary) hypertension: Secondary | ICD-10-CM | POA: Diagnosis not present

## 2024-05-02 DIAGNOSIS — Z8673 Personal history of transient ischemic attack (TIA), and cerebral infarction without residual deficits: Secondary | ICD-10-CM | POA: Diagnosis not present

## 2024-05-02 DIAGNOSIS — G43909 Migraine, unspecified, not intractable, without status migrainosus: Secondary | ICD-10-CM | POA: Diagnosis not present

## 2024-05-02 DIAGNOSIS — R7303 Prediabetes: Secondary | ICD-10-CM | POA: Diagnosis not present

## 2024-05-02 DIAGNOSIS — E663 Overweight: Secondary | ICD-10-CM | POA: Diagnosis not present

## 2024-05-02 DIAGNOSIS — Z6830 Body mass index (BMI) 30.0-30.9, adult: Secondary | ICD-10-CM | POA: Diagnosis not present

## 2024-05-02 DIAGNOSIS — G4700 Insomnia, unspecified: Secondary | ICD-10-CM | POA: Diagnosis not present

## 2024-05-02 DIAGNOSIS — Z8639 Personal history of other endocrine, nutritional and metabolic disease: Secondary | ICD-10-CM | POA: Diagnosis not present

## 2024-05-07 ENCOUNTER — Other Ambulatory Visit (HOSPITAL_COMMUNITY): Payer: Self-pay

## 2024-05-07 MED ORDER — QULIPTA 60 MG PO TABS
60.0000 mg | ORAL_TABLET | Freq: Every day | ORAL | 1 refills | Status: AC
  Filled 2024-05-07 – 2024-06-08 (×2): qty 30, 30d supply, fill #0

## 2024-05-08 ENCOUNTER — Other Ambulatory Visit (HOSPITAL_COMMUNITY): Payer: Self-pay

## 2024-05-22 ENCOUNTER — Other Ambulatory Visit (HOSPITAL_COMMUNITY): Payer: Self-pay

## 2024-05-22 ENCOUNTER — Ambulatory Visit: Admitting: Pulmonary Disease

## 2024-05-22 ENCOUNTER — Encounter (HOSPITAL_COMMUNITY): Payer: Self-pay

## 2024-05-22 ENCOUNTER — Encounter: Payer: Self-pay | Admitting: Pulmonary Disease

## 2024-05-22 VITALS — BP 131/83 | HR 84 | Temp 97.7°F | Ht 64.0 in | Wt 189.0 lb

## 2024-05-22 DIAGNOSIS — J4540 Moderate persistent asthma, uncomplicated: Secondary | ICD-10-CM

## 2024-05-22 DIAGNOSIS — J45909 Unspecified asthma, uncomplicated: Secondary | ICD-10-CM | POA: Diagnosis not present

## 2024-05-22 DIAGNOSIS — R059 Cough, unspecified: Secondary | ICD-10-CM

## 2024-05-22 DIAGNOSIS — K219 Gastro-esophageal reflux disease without esophagitis: Secondary | ICD-10-CM

## 2024-05-22 MED ORDER — FLUTICASONE-SALMETEROL 500-50 MCG/ACT IN AEPB
1.0000 | INHALATION_SPRAY | Freq: Two times a day (BID) | RESPIRATORY_TRACT | 4 refills | Status: AC
  Filled 2024-05-22: qty 180, 90d supply, fill #0

## 2024-05-22 NOTE — Progress Notes (Signed)
 "              Stacey Mendoza    [id number]    08/26/1965  Primary Care Physician:[facility]  Referring Physician: [facility] [address] [place],  [place] [address]  Chief complaint: Follow-up for asthma, cough, post COVID-19  HPI: 59 y.o.  with history of hypertension, stroke Has history of cough when she was placed on lisinopril many years ago.  This improved after she was taken off this medication Developed COVID-19 in June 2019.  She did not require hospitalization.  However she had a recurrence of cough after the infection Cough is nonproductive in nature, no dyspnea, wheezing.  She has been started on albuterol  and Advair  by her primary care for possible reactive airway disease.  Symptoms are worse at night and on laying down Overall the cough is improving but she continues to have residual symptoms  No overt symptoms of allergy or acid reflux.  Underwent right shoulder arthroplasty in June 2022 without any respiratory issue  Interim history: Discussed the use of AI scribe software for clinical note transcription with the patient, who gave verbal consent to proceed.  History of Present Illness Stacey Mendoza is a 59 year old female who presents with persistent cough and chest pain.  Cough - Persistent, mainly nocturnal cough - Symptoms have improved somewhat but continue to require inhaler use - Uses Advair  daily and is running low; requests refill  Chest pain - Intermittent chest pain since December 12 - Previously evaluated in the emergency department - Chest X-ray, EKG, and troponin were normal  Allergic symptoms - Uses over-the-counter nasal spray and Zyrtec for allergy symptoms  Gastroesophageal symptoms - Takes Prilosec 40 mg daily   Relevant Pulmonary History Pets: No pets Occupation: Housekeeping in the pediatric ward of [employer] Exposures: No mold, hot tub, Jacuzzi.  No feather  pillows or comforter Smoking history: Never smoker Travel history: No significant travel history Relevant family history: No significant family history of lung disease: Walk 3 to close sats 99 to 91% lowest is higher than 1  Outpatient Encounter Medications as of 05/22/2024  Medication Sig   albuterol  (PROAIR  HFA) 108 (90 Base) MCG/ACT inhaler Inhale 2 puffs into the lungs every 6 (six) hours as needed for cough/wheezing   amLODipine  (NORVASC ) 10 MG tablet Take 1 tablet (10 mg total) by mouth daily.   aspirin  EC 81 MG EC tablet Take 1 tablet (81 mg total) by mouth daily.   Atogepant  (QULIPTA ) 60 MG TABS Take 1 tablet (60 mg total) by mouth daily.   atorvastatin  (LIPITOR) 20 MG tablet Take 1 tablet (20 mg total) by mouth every evening at 6pm.   buPROPion  (WELLBUTRIN  XL) 300 MG 24 hr tablet Take 1 tablet (300 mg total) by mouth daily.   cetirizine (ZYRTEC) 10 MG tablet Take 10 mg by mouth daily.   fluticasone -salmeterol (ADVAIR  DISKUS) 500-50 MCG/ACT AEPB Inhale 1 puff into the lungs in the morning and at bedtime.   hydrochlorothiazide  (HYDRODIURIL ) 25 MG tablet Take 1 tablet (25 mg total) by mouth daily.   linaclotide  (LINZESS ) 290 MCG CAPS capsule Take 1 capsule (290 mcg total) by mouth daily with breakfast.   linaclotide  (LINZESS ) 290 MCG CAPS capsule Take 1 capsule (290 mcg total) by mouth in the morning on an empty stomach at least 30 minutes before breakfast.   omeprazole  (PRILOSEC) 40 MG capsule Take 1 capsule (40 mg total) by  mouth in the morning and at bedtime.   ondansetron  (ZOFRAN ) 4 MG tablet Take 1 tablet (4 mg total) by mouth every 8 (eight) hours.   phentermine  (ADIPEX-P ) 37.5 MG tablet Take 1/2-1 tablets (18.75-37.5 mg total) by mouth every morning before breakfast.   SUMAtriptan  (IMITREX ) 100 MG tablet Take 1 tablet (100 mg total) by mouth as directed at onset of migraine, may repeat in 2 hours if needed, up to 2 tablets per day.   triamcinolone  cream (KENALOG ) 0.1 % Apply 1  Application topically to affected area 2 (two) times daily as needed for itching.   Ubrogepant  (UBRELVY ) 50 MG TABS Take 1 tablet (50 mg total) by mouth as needed, may repeat 1 dose in 2 hours as needed up to 4 tablets per day.   Fezolinetant  (VEOZAH ) 45 MG TABS Take 1 tablet (45 mg total) by mouth daily.   oseltamivir  (TAMIFLU ) 75 MG capsule Take 1 capsule (75 mg total) by mouth 2 (two) times daily for 5 days (Patient not taking: Reported on 05/22/2024)   Topiramate  ER (TROKENDI  XR) 25 MG CP24 Take 1 capsule (25 mg total) by mouth daily. (Patient not taking: Reported on 05/22/2024)   [DISCONTINUED] temazepam  (RESTORIL ) 15 MG capsule Take 1 capsule (15 mg total) by mouth at bedtime as needed.   No facility-administered encounter medications on file as of 05/22/2024.    Vitals:   05/22/24 1115  BP: 131/83  Pulse: 84  Temp: 97.7 F (36.5 C)  Height: 5' 4 (1.626 m)  Weight: 189 lb (85.7 kg)  SpO2: 99%  TempSrc: Oral  BMI (Calculated): 32.43    Physical Exam GEN: No acute distress. CV: Regular rate and rhythm, no murmurs. LUNGS: Clear to auscultation bilaterally, normal respiratory effort. SKIN JOINTS: Warm and dry, no rash.    Data Reviewed: Imaging: Chest x-ray 09/20/2017-no active cardiopulmonary disease.   Chest x-ray 06/09/2020-no acute cardiopulmonary abnormality Chest x-ray 11/04/2021-no acute cardiopulmonary abnormality Chest x-ray 09/08/2022-no evidence of acute cardiopulmonary disease. Chest x-ray 04/28/2023- No acute cardiopulmonary disease.  I have reviewed the images personally.  PFTs: 07/22/2020 FVC 2.74 [95%], FEV1 2.42 [26%], F/F 88, TLC 3.89 [77%], DLCO 21.91 [94%] Minimal restriction  Labs: CBC 09/23/2017-WBC 6.3, eos 1%, absolute eosinophil count 63 CBC 06/09/2020-WBC 5.4, eos 1.1%, absolute eosinophil count 59  IgE 06/09/2020-38 Assessment & Plan Asthma Well-controlled with improved nocturnal cough. She is out of Advair  inhaler and requires a refill. Recent ED visit  for chest pain with normal EKG, troponin, and chest x-ray. Lungs are clear on examination. - Renewed Advair  inhaler prescription  Allergic rhinitis Managed with over-the-counter steroid nasal spray and Zyrtec. Current regimen is deemed appropriate. - Continue current regimen of over-the-counter steroid nasal spray and Zyrtec  GERD -Continue PPI   Plan/Recommendations: Advair  Steroid nasal spray, antihistamine  Stacey Pyper MD [facility] 05/22/2024, 11:33 AM  CC: [facility] "

## 2024-05-22 NOTE — Patient Instructions (Signed)
" °  VISIT SUMMARY: Today, we addressed your persistent cough, chest pain, and allergic symptoms. Your asthma is well-controlled, and we have renewed your Advair  inhaler prescription. Your allergic rhinitis management with over-the-counter medications is appropriate, so please continue with your current regimen.  YOUR PLAN: ASTHMA: Your asthma is well-controlled, and your nocturnal cough has improved. You are out of your Advair  inhaler. -We have renewed your Advair  inhaler prescription. Please continue using it as directed.  ALLERGIC RHINITIS: Your allergic symptoms are being managed with over-the-counter nasal spray and Zyrtec. -Continue your current regimen of over-the-counter steroid nasal spray and Zyrtec. "

## 2024-05-30 ENCOUNTER — Other Ambulatory Visit (HOSPITAL_COMMUNITY): Payer: Self-pay

## 2024-05-30 MED ORDER — AMOXICILLIN 500 MG PO CAPS
500.0000 mg | ORAL_CAPSULE | Freq: Three times a day (TID) | ORAL | 0 refills | Status: AC
  Filled 2024-05-30: qty 21, 7d supply, fill #0

## 2024-05-31 ENCOUNTER — Other Ambulatory Visit: Payer: Self-pay | Admitting: Pulmonary Disease

## 2024-05-31 ENCOUNTER — Other Ambulatory Visit (HOSPITAL_COMMUNITY): Payer: Self-pay

## 2024-05-31 ENCOUNTER — Other Ambulatory Visit: Payer: Self-pay

## 2024-05-31 ENCOUNTER — Encounter (HOSPITAL_COMMUNITY): Payer: Self-pay

## 2024-05-31 MED ORDER — SUMATRIPTAN SUCCINATE 100 MG PO TABS
100.0000 mg | ORAL_TABLET | Freq: Every day | ORAL | 0 refills | Status: AC | PRN
  Filled 2024-05-31: qty 9, 15d supply, fill #0

## 2024-05-31 MED ORDER — HYDROCHLOROTHIAZIDE 25 MG PO TABS
25.0000 mg | ORAL_TABLET | Freq: Every day | ORAL | 0 refills | Status: AC
  Filled 2024-05-31: qty 90, 90d supply, fill #0

## 2024-06-01 ENCOUNTER — Other Ambulatory Visit (HOSPITAL_COMMUNITY): Payer: Self-pay

## 2024-06-01 MED ORDER — OMEPRAZOLE 40 MG PO CPDR
40.0000 mg | DELAYED_RELEASE_CAPSULE | Freq: Two times a day (BID) | ORAL | 1 refills | Status: AC
  Filled 2024-06-01: qty 60, 30d supply, fill #0

## 2024-06-04 ENCOUNTER — Other Ambulatory Visit (HOSPITAL_COMMUNITY): Payer: Self-pay

## 2024-06-08 ENCOUNTER — Encounter (HOSPITAL_COMMUNITY): Payer: Self-pay

## 2024-06-08 ENCOUNTER — Other Ambulatory Visit (HOSPITAL_COMMUNITY): Payer: Self-pay
